# Patient Record
Sex: Male | Born: 1950 | Race: White | Hispanic: No | Marital: Married | Smoking: Former smoker
Health system: Southern US, Community
[De-identification: ages and names within clinical notes are randomized; demographics above are authoritative.]

## PROBLEM LIST (undated history)

## (undated) DIAGNOSIS — Z9289 Personal history of other medical treatment: Secondary | ICD-10-CM

## (undated) DIAGNOSIS — G709 Myoneural disorder, unspecified: Secondary | ICD-10-CM

## (undated) DIAGNOSIS — E7800 Pure hypercholesterolemia, unspecified: Secondary | ICD-10-CM

## (undated) DIAGNOSIS — Z789 Other specified health status: Secondary | ICD-10-CM

## (undated) DIAGNOSIS — G8929 Other chronic pain: Secondary | ICD-10-CM

## (undated) DIAGNOSIS — R519 Headache, unspecified: Secondary | ICD-10-CM

## (undated) DIAGNOSIS — H9190 Unspecified hearing loss, unspecified ear: Secondary | ICD-10-CM

## (undated) DIAGNOSIS — F419 Anxiety disorder, unspecified: Secondary | ICD-10-CM

## (undated) DIAGNOSIS — E119 Type 2 diabetes mellitus without complications: Secondary | ICD-10-CM

## (undated) DIAGNOSIS — I10 Essential (primary) hypertension: Secondary | ICD-10-CM

## (undated) DIAGNOSIS — M51369 Other intervertebral disc degeneration, lumbar region without mention of lumbar back pain or lower extremity pain: Secondary | ICD-10-CM

## (undated) DIAGNOSIS — K5792 Diverticulitis of intestine, part unspecified, without perforation or abscess without bleeding: Secondary | ICD-10-CM

## (undated) DIAGNOSIS — K219 Gastro-esophageal reflux disease without esophagitis: Secondary | ICD-10-CM

## (undated) DIAGNOSIS — M810 Age-related osteoporosis without current pathological fracture: Secondary | ICD-10-CM

## (undated) DIAGNOSIS — K5900 Constipation, unspecified: Secondary | ICD-10-CM

## (undated) DIAGNOSIS — R51 Headache: Secondary | ICD-10-CM

## (undated) DIAGNOSIS — I519 Heart disease, unspecified: Secondary | ICD-10-CM

## (undated) DIAGNOSIS — F32A Depression, unspecified: Secondary | ICD-10-CM

## (undated) DIAGNOSIS — R42 Dizziness and giddiness: Secondary | ICD-10-CM

## (undated) DIAGNOSIS — K279 Peptic ulcer, site unspecified, unspecified as acute or chronic, without hemorrhage or perforation: Secondary | ICD-10-CM

## (undated) DIAGNOSIS — F329 Major depressive disorder, single episode, unspecified: Secondary | ICD-10-CM

## (undated) DIAGNOSIS — B029 Zoster without complications: Secondary | ICD-10-CM

## (undated) DIAGNOSIS — I2510 Atherosclerotic heart disease of native coronary artery without angina pectoris: Secondary | ICD-10-CM

## (undated) DIAGNOSIS — N200 Calculus of kidney: Secondary | ICD-10-CM

## (undated) DIAGNOSIS — K289 Gastrojejunal ulcer, unspecified as acute or chronic, without hemorrhage or perforation: Secondary | ICD-10-CM

## (undated) DIAGNOSIS — M549 Dorsalgia, unspecified: Secondary | ICD-10-CM

## (undated) DIAGNOSIS — C801 Malignant (primary) neoplasm, unspecified: Secondary | ICD-10-CM

## (undated) DIAGNOSIS — Z87442 Personal history of urinary calculi: Secondary | ICD-10-CM

## (undated) DIAGNOSIS — M5136 Other intervertebral disc degeneration, lumbar region: Secondary | ICD-10-CM

## (undated) HISTORY — DX: Essential (primary) hypertension: I10

## (undated) HISTORY — DX: Other chronic pain: G89.29

## (undated) HISTORY — DX: Other intervertebral disc degeneration, lumbar region: M51.36

## (undated) HISTORY — DX: Major depressive disorder, single episode, unspecified: F32.9

## (undated) HISTORY — PX: CORONARY ANGIOPLASTY: SHX604

## (undated) HISTORY — DX: Zoster without complications: B02.9

## (undated) HISTORY — DX: Depression, unspecified: F32.A

## (undated) HISTORY — PX: CHOLECYSTECTOMY: SHX55

## (undated) HISTORY — PX: HAND SURGERY: SHX662

## (undated) HISTORY — PX: CORONARY STENT PLACEMENT: SHX1402

## (undated) HISTORY — PX: BACK SURGERY: SHX140

## (undated) HISTORY — DX: Heart disease, unspecified: I51.9

## (undated) HISTORY — DX: Type 2 diabetes mellitus without complications: E11.9

## (undated) HISTORY — DX: Dorsalgia, unspecified: M54.9

## (undated) HISTORY — DX: Dizziness and giddiness: R42

## (undated) HISTORY — DX: Other intervertebral disc degeneration, lumbar region without mention of lumbar back pain or lower extremity pain: M51.369

---

## 2004-04-24 ENCOUNTER — Ambulatory Visit: Payer: Self-pay | Admitting: Pain Medicine

## 2004-05-02 ENCOUNTER — Ambulatory Visit: Payer: Self-pay | Admitting: Pain Medicine

## 2004-06-04 ENCOUNTER — Ambulatory Visit: Payer: Self-pay | Admitting: Pain Medicine

## 2004-06-18 ENCOUNTER — Ambulatory Visit: Payer: Self-pay | Admitting: Pain Medicine

## 2004-08-06 ENCOUNTER — Ambulatory Visit: Payer: Self-pay | Admitting: Pain Medicine

## 2004-08-26 ENCOUNTER — Ambulatory Visit: Payer: Self-pay | Admitting: Pain Medicine

## 2004-09-05 ENCOUNTER — Ambulatory Visit: Payer: Self-pay | Admitting: Pain Medicine

## 2004-10-03 ENCOUNTER — Ambulatory Visit: Payer: Self-pay | Admitting: Pain Medicine

## 2004-10-31 ENCOUNTER — Ambulatory Visit: Payer: Self-pay | Admitting: Pain Medicine

## 2004-11-06 ENCOUNTER — Ambulatory Visit: Payer: Self-pay | Admitting: Pain Medicine

## 2004-11-28 ENCOUNTER — Ambulatory Visit: Payer: Self-pay | Admitting: Pain Medicine

## 2004-12-31 ENCOUNTER — Ambulatory Visit: Payer: Self-pay | Admitting: Pain Medicine

## 2005-01-06 ENCOUNTER — Ambulatory Visit: Payer: Self-pay | Admitting: Pain Medicine

## 2005-01-30 ENCOUNTER — Ambulatory Visit: Payer: Self-pay | Admitting: Pain Medicine

## 2005-02-08 ENCOUNTER — Other Ambulatory Visit: Payer: Self-pay

## 2005-02-09 ENCOUNTER — Inpatient Hospital Stay: Payer: Self-pay | Admitting: Internal Medicine

## 2005-02-10 ENCOUNTER — Other Ambulatory Visit: Payer: Self-pay

## 2005-03-04 ENCOUNTER — Ambulatory Visit: Payer: Self-pay | Admitting: Pain Medicine

## 2005-03-31 ENCOUNTER — Ambulatory Visit: Payer: Self-pay | Admitting: Pain Medicine

## 2005-04-29 ENCOUNTER — Ambulatory Visit: Payer: Self-pay | Admitting: Pain Medicine

## 2005-05-05 ENCOUNTER — Ambulatory Visit: Payer: Self-pay | Admitting: Pain Medicine

## 2005-06-03 ENCOUNTER — Ambulatory Visit: Payer: Self-pay | Admitting: Pain Medicine

## 2005-06-09 ENCOUNTER — Ambulatory Visit: Payer: Self-pay | Admitting: Pain Medicine

## 2005-07-01 ENCOUNTER — Ambulatory Visit: Payer: Self-pay | Admitting: Pain Medicine

## 2005-07-30 ENCOUNTER — Ambulatory Visit: Payer: Self-pay | Admitting: Pain Medicine

## 2005-09-02 ENCOUNTER — Ambulatory Visit: Payer: Self-pay | Admitting: Pain Medicine

## 2005-09-08 ENCOUNTER — Ambulatory Visit: Payer: Self-pay | Admitting: Pain Medicine

## 2005-09-30 ENCOUNTER — Ambulatory Visit: Payer: Self-pay | Admitting: Pain Medicine

## 2005-10-16 ENCOUNTER — Ambulatory Visit: Payer: Self-pay | Admitting: Psychiatry

## 2005-10-27 ENCOUNTER — Ambulatory Visit: Payer: Self-pay | Admitting: Pain Medicine

## 2005-11-25 ENCOUNTER — Ambulatory Visit: Payer: Self-pay | Admitting: Pain Medicine

## 2005-12-01 ENCOUNTER — Ambulatory Visit: Payer: Self-pay | Admitting: Pain Medicine

## 2005-12-25 ENCOUNTER — Ambulatory Visit: Payer: Self-pay | Admitting: Pain Medicine

## 2006-01-27 ENCOUNTER — Ambulatory Visit: Payer: Self-pay | Admitting: Pain Medicine

## 2006-02-09 ENCOUNTER — Ambulatory Visit: Payer: Self-pay | Admitting: Pain Medicine

## 2006-03-03 ENCOUNTER — Ambulatory Visit: Payer: Self-pay | Admitting: Pain Medicine

## 2006-03-09 ENCOUNTER — Ambulatory Visit: Payer: Self-pay | Admitting: Pain Medicine

## 2006-04-02 ENCOUNTER — Ambulatory Visit: Payer: Self-pay | Admitting: Pain Medicine

## 2006-04-20 ENCOUNTER — Ambulatory Visit: Payer: Self-pay | Admitting: Pain Medicine

## 2006-05-14 ENCOUNTER — Ambulatory Visit: Payer: Self-pay | Admitting: Pain Medicine

## 2006-06-16 ENCOUNTER — Ambulatory Visit: Payer: Self-pay | Admitting: Pain Medicine

## 2006-06-24 ENCOUNTER — Ambulatory Visit: Payer: Self-pay | Admitting: Pain Medicine

## 2006-07-21 ENCOUNTER — Ambulatory Visit: Payer: Self-pay | Admitting: Pain Medicine

## 2006-07-27 ENCOUNTER — Ambulatory Visit: Payer: Self-pay | Admitting: Pain Medicine

## 2006-08-18 ENCOUNTER — Ambulatory Visit: Payer: Self-pay | Admitting: Pain Medicine

## 2006-08-25 ENCOUNTER — Ambulatory Visit: Payer: Self-pay | Admitting: Physician Assistant

## 2006-08-26 ENCOUNTER — Ambulatory Visit: Payer: Self-pay | Admitting: Pain Medicine

## 2006-09-10 ENCOUNTER — Ambulatory Visit: Payer: Self-pay | Admitting: Pain Medicine

## 2006-09-23 ENCOUNTER — Ambulatory Visit: Payer: Self-pay | Admitting: Pain Medicine

## 2006-10-13 ENCOUNTER — Ambulatory Visit: Payer: Self-pay | Admitting: Pain Medicine

## 2006-10-21 ENCOUNTER — Ambulatory Visit: Payer: Self-pay | Admitting: Pain Medicine

## 2006-11-12 ENCOUNTER — Ambulatory Visit: Payer: Self-pay | Admitting: Pain Medicine

## 2006-11-18 ENCOUNTER — Ambulatory Visit: Payer: Self-pay | Admitting: Pain Medicine

## 2006-12-15 ENCOUNTER — Ambulatory Visit: Payer: Self-pay | Admitting: Pain Medicine

## 2006-12-21 ENCOUNTER — Ambulatory Visit: Payer: Self-pay | Admitting: Pain Medicine

## 2007-01-18 ENCOUNTER — Ambulatory Visit: Payer: Self-pay | Admitting: Pain Medicine

## 2007-01-27 ENCOUNTER — Ambulatory Visit: Payer: Self-pay | Admitting: Pain Medicine

## 2007-02-16 ENCOUNTER — Ambulatory Visit: Payer: Self-pay | Admitting: Pain Medicine

## 2007-02-24 ENCOUNTER — Ambulatory Visit: Payer: Self-pay | Admitting: Pain Medicine

## 2007-03-16 ENCOUNTER — Ambulatory Visit: Payer: Self-pay | Admitting: Pain Medicine

## 2007-04-14 ENCOUNTER — Ambulatory Visit: Payer: Self-pay | Admitting: Pain Medicine

## 2007-05-11 ENCOUNTER — Ambulatory Visit: Payer: Self-pay | Admitting: Pain Medicine

## 2007-05-19 ENCOUNTER — Ambulatory Visit: Payer: Self-pay | Admitting: Pain Medicine

## 2007-06-17 ENCOUNTER — Ambulatory Visit: Payer: Self-pay | Admitting: Pain Medicine

## 2007-07-19 ENCOUNTER — Ambulatory Visit: Payer: Self-pay | Admitting: Pain Medicine

## 2007-08-12 ENCOUNTER — Ambulatory Visit: Payer: Self-pay | Admitting: Pain Medicine

## 2007-08-18 ENCOUNTER — Ambulatory Visit: Payer: Self-pay | Admitting: Pain Medicine

## 2007-09-08 ENCOUNTER — Ambulatory Visit: Payer: Self-pay | Admitting: Pain Medicine

## 2007-10-12 ENCOUNTER — Ambulatory Visit: Payer: Self-pay | Admitting: Pain Medicine

## 2007-10-20 ENCOUNTER — Ambulatory Visit: Payer: Self-pay | Admitting: Pain Medicine

## 2007-10-25 ENCOUNTER — Ambulatory Visit: Payer: Self-pay | Admitting: Unknown Physician Specialty

## 2007-11-16 ENCOUNTER — Ambulatory Visit: Payer: Self-pay | Admitting: Pain Medicine

## 2007-11-22 ENCOUNTER — Ambulatory Visit: Payer: Self-pay | Admitting: Pain Medicine

## 2007-12-16 ENCOUNTER — Ambulatory Visit: Payer: Self-pay | Admitting: Pain Medicine

## 2008-01-12 ENCOUNTER — Ambulatory Visit: Payer: Self-pay | Admitting: Pain Medicine

## 2008-02-14 ENCOUNTER — Ambulatory Visit: Payer: Self-pay | Admitting: Pain Medicine

## 2008-03-14 ENCOUNTER — Ambulatory Visit: Payer: Self-pay | Admitting: Pain Medicine

## 2008-03-29 ENCOUNTER — Ambulatory Visit: Payer: Self-pay | Admitting: Pain Medicine

## 2008-04-13 ENCOUNTER — Ambulatory Visit: Payer: Self-pay | Admitting: Pain Medicine

## 2008-04-24 ENCOUNTER — Ambulatory Visit: Payer: Self-pay | Admitting: Pain Medicine

## 2008-05-11 ENCOUNTER — Ambulatory Visit: Payer: Self-pay | Admitting: Pain Medicine

## 2008-06-12 ENCOUNTER — Ambulatory Visit: Payer: Self-pay | Admitting: Pain Medicine

## 2008-07-11 ENCOUNTER — Ambulatory Visit: Payer: Self-pay | Admitting: Pain Medicine

## 2008-08-10 ENCOUNTER — Ambulatory Visit: Payer: Self-pay | Admitting: Pain Medicine

## 2008-08-30 ENCOUNTER — Ambulatory Visit: Payer: Self-pay | Admitting: Pain Medicine

## 2008-10-05 ENCOUNTER — Ambulatory Visit: Payer: Self-pay | Admitting: Pain Medicine

## 2008-11-09 ENCOUNTER — Ambulatory Visit: Payer: Self-pay | Admitting: Pain Medicine

## 2008-11-15 ENCOUNTER — Ambulatory Visit: Payer: Self-pay | Admitting: Pain Medicine

## 2008-12-07 ENCOUNTER — Ambulatory Visit: Payer: Self-pay | Admitting: Pain Medicine

## 2009-01-09 ENCOUNTER — Ambulatory Visit: Payer: Self-pay | Admitting: Pain Medicine

## 2009-01-17 ENCOUNTER — Ambulatory Visit: Payer: Self-pay | Admitting: Pain Medicine

## 2009-02-08 ENCOUNTER — Ambulatory Visit: Payer: Self-pay | Admitting: Pain Medicine

## 2009-02-12 ENCOUNTER — Ambulatory Visit: Payer: Self-pay | Admitting: Pain Medicine

## 2009-03-06 ENCOUNTER — Ambulatory Visit: Payer: Self-pay | Admitting: Pain Medicine

## 2009-03-21 ENCOUNTER — Ambulatory Visit: Payer: Self-pay | Admitting: Pain Medicine

## 2009-04-05 ENCOUNTER — Ambulatory Visit: Payer: Self-pay | Admitting: Pain Medicine

## 2009-05-08 ENCOUNTER — Ambulatory Visit: Payer: Self-pay | Admitting: Pain Medicine

## 2009-06-13 ENCOUNTER — Ambulatory Visit: Payer: Self-pay | Admitting: Pain Medicine

## 2009-06-26 ENCOUNTER — Ambulatory Visit: Payer: Self-pay | Admitting: Unknown Physician Specialty

## 2009-06-27 ENCOUNTER — Ambulatory Visit: Payer: Self-pay | Admitting: Pain Medicine

## 2009-07-10 ENCOUNTER — Ambulatory Visit: Payer: Self-pay | Admitting: Pain Medicine

## 2009-08-07 ENCOUNTER — Ambulatory Visit: Payer: Self-pay | Admitting: Pain Medicine

## 2009-09-12 ENCOUNTER — Ambulatory Visit: Payer: Self-pay | Admitting: Pain Medicine

## 2009-10-11 ENCOUNTER — Ambulatory Visit: Payer: Self-pay | Admitting: Pain Medicine

## 2009-11-06 ENCOUNTER — Ambulatory Visit: Payer: Self-pay | Admitting: Pain Medicine

## 2009-12-04 ENCOUNTER — Ambulatory Visit: Payer: Self-pay | Admitting: Pain Medicine

## 2010-01-03 ENCOUNTER — Ambulatory Visit: Payer: Self-pay | Admitting: Pain Medicine

## 2010-01-31 ENCOUNTER — Ambulatory Visit: Payer: Self-pay | Admitting: Pain Medicine

## 2010-03-05 ENCOUNTER — Ambulatory Visit: Payer: Self-pay | Admitting: Pain Medicine

## 2010-04-04 ENCOUNTER — Ambulatory Visit: Payer: Self-pay | Admitting: Pain Medicine

## 2010-05-02 ENCOUNTER — Ambulatory Visit: Payer: Self-pay | Admitting: Pain Medicine

## 2010-05-30 ENCOUNTER — Ambulatory Visit: Payer: Self-pay | Admitting: Pain Medicine

## 2010-07-02 ENCOUNTER — Ambulatory Visit: Payer: Self-pay | Admitting: Pain Medicine

## 2010-07-31 ENCOUNTER — Ambulatory Visit: Payer: Self-pay | Admitting: Pain Medicine

## 2010-08-29 ENCOUNTER — Ambulatory Visit: Payer: Self-pay | Admitting: Pain Medicine

## 2010-10-01 ENCOUNTER — Ambulatory Visit: Payer: Self-pay | Admitting: Pain Medicine

## 2010-10-31 ENCOUNTER — Ambulatory Visit: Payer: Self-pay | Admitting: Pain Medicine

## 2010-11-28 ENCOUNTER — Ambulatory Visit: Payer: Self-pay | Admitting: Pain Medicine

## 2010-12-31 ENCOUNTER — Ambulatory Visit: Payer: Self-pay | Admitting: Pain Medicine

## 2011-01-30 ENCOUNTER — Ambulatory Visit: Payer: Self-pay | Admitting: Pain Medicine

## 2011-02-27 ENCOUNTER — Ambulatory Visit: Payer: Self-pay | Admitting: Pain Medicine

## 2011-03-27 ENCOUNTER — Ambulatory Visit: Payer: Self-pay | Admitting: Pain Medicine

## 2011-04-24 ENCOUNTER — Ambulatory Visit: Payer: Self-pay | Admitting: Pain Medicine

## 2011-05-27 ENCOUNTER — Ambulatory Visit: Payer: Self-pay | Admitting: Pain Medicine

## 2011-06-24 ENCOUNTER — Ambulatory Visit: Payer: Self-pay | Admitting: Pain Medicine

## 2011-07-02 ENCOUNTER — Emergency Department: Payer: Self-pay

## 2011-07-24 ENCOUNTER — Ambulatory Visit: Payer: Self-pay | Admitting: Pain Medicine

## 2011-08-26 ENCOUNTER — Ambulatory Visit: Payer: Self-pay | Admitting: Pain Medicine

## 2011-09-23 ENCOUNTER — Ambulatory Visit: Payer: Self-pay | Admitting: Pain Medicine

## 2011-10-28 ENCOUNTER — Ambulatory Visit: Payer: Self-pay | Admitting: Pain Medicine

## 2011-11-25 ENCOUNTER — Ambulatory Visit: Payer: Self-pay | Admitting: Pain Medicine

## 2011-12-25 ENCOUNTER — Ambulatory Visit: Payer: Self-pay | Admitting: Pain Medicine

## 2012-01-27 ENCOUNTER — Ambulatory Visit: Payer: Self-pay | Admitting: Pain Medicine

## 2012-02-24 ENCOUNTER — Ambulatory Visit: Payer: Self-pay | Admitting: Pain Medicine

## 2012-03-25 ENCOUNTER — Ambulatory Visit: Payer: Self-pay | Admitting: Pain Medicine

## 2012-04-20 ENCOUNTER — Ambulatory Visit: Payer: Self-pay | Admitting: Pain Medicine

## 2012-05-25 ENCOUNTER — Ambulatory Visit: Payer: Self-pay | Admitting: Pain Medicine

## 2012-06-22 ENCOUNTER — Ambulatory Visit: Payer: Self-pay | Admitting: Pain Medicine

## 2012-07-22 ENCOUNTER — Ambulatory Visit: Payer: Self-pay | Admitting: Pain Medicine

## 2012-08-09 ENCOUNTER — Ambulatory Visit: Payer: Self-pay | Admitting: Unknown Physician Specialty

## 2012-08-17 ENCOUNTER — Ambulatory Visit: Payer: Self-pay | Admitting: Pain Medicine

## 2012-09-21 ENCOUNTER — Ambulatory Visit: Payer: Self-pay | Admitting: Pain Medicine

## 2012-10-21 ENCOUNTER — Ambulatory Visit: Payer: Self-pay | Admitting: Pain Medicine

## 2012-11-11 ENCOUNTER — Ambulatory Visit: Payer: Self-pay | Admitting: Pain Medicine

## 2012-11-23 ENCOUNTER — Ambulatory Visit: Payer: Self-pay | Admitting: Cardiology

## 2012-12-16 ENCOUNTER — Ambulatory Visit: Payer: Self-pay | Admitting: Pain Medicine

## 2012-12-22 ENCOUNTER — Ambulatory Visit: Payer: Self-pay | Admitting: Cardiology

## 2012-12-22 LAB — CK TOTAL AND CKMB (NOT AT ARMC)
CK, Total: 32 U/L — ABNORMAL LOW (ref 35–232)
CK-MB: 0.8 ng/mL (ref 0.5–3.6)

## 2012-12-23 LAB — BASIC METABOLIC PANEL
BUN: 16 mg/dL (ref 7–18)
Calcium, Total: 9 mg/dL (ref 8.5–10.1)
Co2: 29 mmol/L (ref 21–32)
Glucose: 187 mg/dL — ABNORMAL HIGH (ref 65–99)
Osmolality: 278 (ref 275–301)
Potassium: 4.3 mmol/L (ref 3.5–5.1)
Sodium: 136 mmol/L (ref 136–145)

## 2013-01-18 ENCOUNTER — Ambulatory Visit: Payer: Self-pay | Admitting: Pain Medicine

## 2013-02-17 ENCOUNTER — Ambulatory Visit: Payer: Self-pay | Admitting: Pain Medicine

## 2013-03-16 ENCOUNTER — Ambulatory Visit: Payer: Self-pay | Admitting: Pain Medicine

## 2013-04-19 ENCOUNTER — Ambulatory Visit: Payer: Self-pay | Admitting: Pain Medicine

## 2013-05-17 ENCOUNTER — Ambulatory Visit: Payer: Self-pay | Admitting: Pain Medicine

## 2013-06-16 ENCOUNTER — Ambulatory Visit: Payer: Self-pay | Admitting: Pain Medicine

## 2013-07-19 ENCOUNTER — Ambulatory Visit: Payer: Self-pay | Admitting: Pain Medicine

## 2013-08-18 ENCOUNTER — Ambulatory Visit: Payer: Self-pay | Admitting: Pain Medicine

## 2013-09-20 ENCOUNTER — Ambulatory Visit: Payer: Self-pay | Admitting: Pain Medicine

## 2013-10-18 ENCOUNTER — Ambulatory Visit: Payer: Self-pay | Admitting: Pain Medicine

## 2013-11-11 DIAGNOSIS — E785 Hyperlipidemia, unspecified: Secondary | ICD-10-CM | POA: Insufficient documentation

## 2013-11-11 DIAGNOSIS — I10 Essential (primary) hypertension: Secondary | ICD-10-CM | POA: Insufficient documentation

## 2013-11-11 DIAGNOSIS — M5136 Other intervertebral disc degeneration, lumbar region: Secondary | ICD-10-CM | POA: Insufficient documentation

## 2013-11-11 DIAGNOSIS — M5126 Other intervertebral disc displacement, lumbar region: Secondary | ICD-10-CM | POA: Insufficient documentation

## 2013-11-22 ENCOUNTER — Ambulatory Visit: Payer: Self-pay | Admitting: Pain Medicine

## 2013-12-21 ENCOUNTER — Ambulatory Visit: Payer: Self-pay | Admitting: Pain Medicine

## 2014-01-09 DIAGNOSIS — R0609 Other forms of dyspnea: Secondary | ICD-10-CM

## 2014-01-09 DIAGNOSIS — R195 Other fecal abnormalities: Secondary | ICD-10-CM | POA: Insufficient documentation

## 2014-01-09 DIAGNOSIS — R1013 Epigastric pain: Secondary | ICD-10-CM

## 2014-01-09 DIAGNOSIS — K5909 Other constipation: Secondary | ICD-10-CM | POA: Insufficient documentation

## 2014-01-09 DIAGNOSIS — R748 Abnormal levels of other serum enzymes: Secondary | ICD-10-CM | POA: Insufficient documentation

## 2014-01-09 DIAGNOSIS — D649 Anemia, unspecified: Secondary | ICD-10-CM | POA: Insufficient documentation

## 2014-01-09 DIAGNOSIS — G8929 Other chronic pain: Secondary | ICD-10-CM | POA: Insufficient documentation

## 2014-01-17 ENCOUNTER — Ambulatory Visit: Payer: Self-pay | Admitting: Pain Medicine

## 2014-02-15 DIAGNOSIS — R079 Chest pain, unspecified: Secondary | ICD-10-CM | POA: Insufficient documentation

## 2014-02-16 ENCOUNTER — Ambulatory Visit: Payer: Self-pay | Admitting: Pain Medicine

## 2014-02-22 ENCOUNTER — Ambulatory Visit: Payer: Self-pay | Admitting: Cardiology

## 2014-03-21 ENCOUNTER — Ambulatory Visit: Payer: Self-pay | Admitting: Pain Medicine

## 2014-04-19 ENCOUNTER — Ambulatory Visit: Payer: Self-pay | Admitting: Pain Medicine

## 2014-05-24 ENCOUNTER — Ambulatory Visit: Payer: Self-pay | Admitting: Pain Medicine

## 2014-06-21 ENCOUNTER — Ambulatory Visit: Payer: Self-pay | Admitting: Pain Medicine

## 2014-07-24 ENCOUNTER — Ambulatory Visit: Payer: Self-pay | Admitting: Pain Medicine

## 2014-08-23 ENCOUNTER — Ambulatory Visit: Payer: Self-pay | Admitting: Pain Medicine

## 2014-09-20 ENCOUNTER — Ambulatory Visit: Payer: Self-pay | Admitting: Pain Medicine

## 2014-10-17 ENCOUNTER — Ambulatory Visit
Admit: 2014-10-17 | Disposition: A | Discharge status: Home / Self Care | Payer: Self-pay | Attending: Pain Medicine | Admitting: Pain Medicine

## 2014-11-03 NOTE — Discharge Summary (Signed)
PATIENT NAME:  Todd Banks, Todd Banks MR#:  [id number] DATE OF BIRTH:  11-12-1950  DATE OF ADMISSION:  12/22/2012 DATE OF DISCHARGE:   12/23/2012  PRIMARY CARE PHYSICIAN:  Dr. Kary Kos.  FINAL DIAGNOSES: 1.  Coronary artery disease.  2.  Hypertension.  3.  Hyperlipidemia   DISCHARGE MEDICATIONS:  Aspirin 325 mg daily, Effient 10 mg daily, gemfibrozil 600 mg b.i.d., metoprolol 25 mg daily, pravastatin 40 mg daily, isosorbide dinitrate 30 mg b.i.d., trazodone 150 mg at bedtime, alprazolam 0.5 mg p.r.n., B12 injection, Humalog sliding scale, methadone tablet 10 mg, 6 tablets p.o. daily if tolerated; oxycodone 5 mg 2 to 4 tabs p.o. daily if tolerated, orphenadrine tablet extended release 100 mg b.i.d., bupropion 75 mg t.i.d., omeprazole 20 mg daily, Humalog 30 units t.i.d., metformin 1000 mg b.i.d., Lantus 60 units at bedtime.   PROCEDURES:  Percutaneous coronary intervention on 12/22/2012.   HISTORY OF PRESENT ILLNESS:  Please see admission H and [place] COURSE:  The patient underwent elective percutaneous coronary intervention on 12/22/2012, receiving a 2.25 x 16 mm drug-eluting Promus stent in the posterior descending coronary artery and a 2.5 x 16 mm Promus stent in the proximal left circumflex. The patient had an excellent angiographic result. Postprocedural CPK and MB were 32 and 0.8, respectively. On the morning of 12/23/2012, the patient was ambulating without difficulty. He was discharged home in stable condition. He is scheduled to see me in followup in 1 to 2 weeks.    ____________________________ Isaias Cowman, MD ap:dmm D: 12/23/2012 08:05:00 ET T: 12/23/2012 10:20:43 ET JOB#: [id number]  cc: Isaias Cowman, MD, <Dictator> Isaias Cowman MD ELECTRONICALLY SIGNED 12/27/2012 12:47

## 2014-11-15 ENCOUNTER — Encounter: Payer: Self-pay | Admitting: Pain Medicine

## 2014-11-19 DIAGNOSIS — M5137 Other intervertebral disc degeneration, lumbosacral region: Secondary | ICD-10-CM | POA: Insufficient documentation

## 2014-11-19 DIAGNOSIS — B0229 Other postherpetic nervous system involvement: Secondary | ICD-10-CM | POA: Insufficient documentation

## 2014-11-19 DIAGNOSIS — M1990 Unspecified osteoarthritis, unspecified site: Secondary | ICD-10-CM | POA: Insufficient documentation

## 2014-11-19 DIAGNOSIS — M961 Postlaminectomy syndrome, not elsewhere classified: Secondary | ICD-10-CM | POA: Insufficient documentation

## 2014-11-19 DIAGNOSIS — M51379 Other intervertebral disc degeneration, lumbosacral region without mention of lumbar back pain or lower extremity pain: Secondary | ICD-10-CM | POA: Insufficient documentation

## 2014-11-19 NOTE — Patient Instructions (Addendum)
Continue present medications.  F/U PCP for evaluation of BP and general medical condition.  F/U neurological evaluation.  .F/U surgical evaluation.   May consider radiofrequency rhizolysis, intraspinal implantation, and other procedures  Patient to call [facility] for any concerns prior to scheduled appointment.  Patient is to call [facility] should the patient have concerns prior to return appointment.  Return in 1 month for evaluation and medication refill.

## 2014-11-20 ENCOUNTER — Ambulatory Visit: Payer: 59 | Attending: Pain Medicine | Admitting: Pain Medicine

## 2014-11-20 ENCOUNTER — Encounter: Payer: Self-pay | Admitting: Pain Medicine

## 2014-11-20 VITALS — BP 121/85 | HR 82 | Temp 97.7°F | Resp 16 | Ht 69.0 in | Wt 203.0 lb

## 2014-11-20 DIAGNOSIS — M150 Primary generalized (osteo)arthritis: Secondary | ICD-10-CM

## 2014-11-20 DIAGNOSIS — M47816 Spondylosis without myelopathy or radiculopathy, lumbar region: Secondary | ICD-10-CM | POA: Insufficient documentation

## 2014-11-20 DIAGNOSIS — E1140 Type 2 diabetes mellitus with diabetic neuropathy, unspecified: Secondary | ICD-10-CM | POA: Insufficient documentation

## 2014-11-20 DIAGNOSIS — R209 Unspecified disturbances of skin sensation: Secondary | ICD-10-CM | POA: Insufficient documentation

## 2014-11-20 DIAGNOSIS — B0229 Other postherpetic nervous system involvement: Secondary | ICD-10-CM

## 2014-11-20 DIAGNOSIS — M961 Postlaminectomy syndrome, not elsewhere classified: Secondary | ICD-10-CM

## 2014-11-20 DIAGNOSIS — M159 Polyosteoarthritis, unspecified: Secondary | ICD-10-CM

## 2014-11-20 DIAGNOSIS — M545 Low back pain: Secondary | ICD-10-CM | POA: Diagnosis present

## 2014-11-20 DIAGNOSIS — M5137 Other intervertebral disc degeneration, lumbosacral region: Secondary | ICD-10-CM

## 2014-11-20 MED ORDER — ORPHENADRINE CITRATE ER 100 MG PO TB12: ORAL_TABLET | ORAL | Status: DC

## 2014-11-20 MED ORDER — METHADONE HCL 10 MG PO TABS: ORAL_TABLET | ORAL | Status: DC

## 2014-11-20 MED ORDER — OXYCODONE HCL 5 MG PO CAPS: ORAL_CAPSULE | ORAL | Status: DC

## 2014-11-20 NOTE — Progress Notes (Signed)
   Subjective:    Patient ID: Todd Banks, male    DOB: 30-Aug-1950, 64 y.o.   MRN: [id number]  HPI    Review of Systems     Objective:   Physical Exam        Assessment & Plan:

## 2014-11-20 NOTE — Progress Notes (Signed)
Patient is 64 year old gentleman returns to pain management for evaluation of lower back and lower extremity pain pain is increased with standing and walking pain involves the left lower extremity more than the right lower extremity. More interventional treatment at this time and consider modification of treatment pending patient's response to the present treatment plan.    Physical examination revealed patient to be with tenderness to palpation of the paraspinal musculature region of the cervical region and thoracic regions. He was tenderness of the acromioclavicular clavicular and glenohumeral joint region left greater than the right with slightly decreased grip strength. Palpation of the thoracic paraspinal musculature region was evidence of muscle spasms. There was tenderness over the lumbar paraspinal musculature region lumbar facet region with extension and palpation producing severe discomfort. There was tenderness over the gluteal and piriformis musculature regions severe tenderness over the PSIS and tenderness of the PIIS region Straight  leg raising was decreased to 20. The abdomen was soft and nontender. No costovertebral angle tenderness was noted. Assessment patient with lumbar lower extremity pain due to degenerative changes of the lumbar spine with surgical intervention of the lumbar region and history of postherpetic neuralgia of the left lower extremity as well as diabetes mellitus with diabetic neuropathy which appeared to be contributing to patient's lumbar and lower extremity pain and paresthesias  Continue present medications.  F/U PCP for evaliation of  BP and general medical  Condition.  F/U surgical evaluation.  F/U nrurological evaluation.  May consider radiofrequency rhizolysis or intraspinal procedures pending response to present treatment and F/U evaluation.  Patient to call [facility] should patient have concerns prior to scheduled return appointment. Marland Kitchen

## 2014-11-20 NOTE — Progress Notes (Signed)
Patient discharged ambulatory at 3:08 pm

## 2014-11-20 NOTE — Progress Notes (Signed)
   Subjective:    Patient ID: Todd Banks, male    DOB: 19-Jun-1951, 64 y.o.   MRN: [id number]  HPI    Review of Systems     Objective:   Physical Exam        Assessment & Plan:

## 2014-12-17 ENCOUNTER — Other Ambulatory Visit: Payer: Self-pay | Admitting: Pain Medicine

## 2014-12-17 DIAGNOSIS — E1340 Other specified diabetes mellitus with diabetic neuropathy, unspecified: Secondary | ICD-10-CM

## 2014-12-17 DIAGNOSIS — M961 Postlaminectomy syndrome, not elsewhere classified: Secondary | ICD-10-CM

## 2014-12-17 DIAGNOSIS — M5137 Other intervertebral disc degeneration, lumbosacral region: Secondary | ICD-10-CM

## 2014-12-17 DIAGNOSIS — M160 Bilateral primary osteoarthritis of hip: Secondary | ICD-10-CM

## 2014-12-17 DIAGNOSIS — B0229 Other postherpetic nervous system involvement: Secondary | ICD-10-CM

## 2014-12-21 ENCOUNTER — Ambulatory Visit: Payer: 59 | Attending: Pain Medicine | Admitting: Pain Medicine

## 2014-12-21 ENCOUNTER — Encounter: Payer: Self-pay | Admitting: Pain Medicine

## 2014-12-21 ENCOUNTER — Encounter (INDEPENDENT_AMBULATORY_CARE_PROVIDER_SITE_OTHER): Payer: Self-pay

## 2014-12-21 VITALS — BP 134/65 | HR 63 | Temp 98.0°F | Resp 16 | Ht 70.0 in | Wt 204.0 lb

## 2014-12-21 DIAGNOSIS — B0229 Other postherpetic nervous system involvement: Secondary | ICD-10-CM | POA: Diagnosis not present

## 2014-12-21 DIAGNOSIS — Z9889 Other specified postprocedural states: Secondary | ICD-10-CM | POA: Insufficient documentation

## 2014-12-21 DIAGNOSIS — E1340 Other specified diabetes mellitus with diabetic neuropathy, unspecified: Secondary | ICD-10-CM

## 2014-12-21 DIAGNOSIS — M545 Low back pain: Secondary | ICD-10-CM | POA: Diagnosis present

## 2014-12-21 DIAGNOSIS — M533 Sacrococcygeal disorders, not elsewhere classified: Secondary | ICD-10-CM | POA: Insufficient documentation

## 2014-12-21 DIAGNOSIS — M4806 Spinal stenosis, lumbar region: Secondary | ICD-10-CM | POA: Insufficient documentation

## 2014-12-21 DIAGNOSIS — M5137 Other intervertebral disc degeneration, lumbosacral region: Secondary | ICD-10-CM

## 2014-12-21 DIAGNOSIS — M79604 Pain in right leg: Secondary | ICD-10-CM | POA: Diagnosis present

## 2014-12-21 DIAGNOSIS — M461 Sacroiliitis, not elsewhere classified: Secondary | ICD-10-CM | POA: Insufficient documentation

## 2014-12-21 DIAGNOSIS — M19019 Primary osteoarthritis, unspecified shoulder: Secondary | ICD-10-CM | POA: Diagnosis not present

## 2014-12-21 DIAGNOSIS — E1140 Type 2 diabetes mellitus with diabetic neuropathy, unspecified: Secondary | ICD-10-CM | POA: Insufficient documentation

## 2014-12-21 DIAGNOSIS — M5136 Other intervertebral disc degeneration, lumbar region: Secondary | ICD-10-CM | POA: Diagnosis not present

## 2014-12-21 DIAGNOSIS — M961 Postlaminectomy syndrome, not elsewhere classified: Secondary | ICD-10-CM

## 2014-12-21 DIAGNOSIS — M160 Bilateral primary osteoarthritis of hip: Secondary | ICD-10-CM

## 2014-12-21 DIAGNOSIS — M79605 Pain in left leg: Secondary | ICD-10-CM | POA: Diagnosis present

## 2014-12-21 MED ORDER — OXYCODONE HCL 5 MG PO CAPS: ORAL_CAPSULE | ORAL | Status: DC

## 2014-12-21 MED ORDER — ORPHENADRINE CITRATE ER 100 MG PO TB12: ORAL_TABLET | ORAL | Status: DC

## 2014-12-21 MED ORDER — METHADONE HCL 10 MG PO TABS: ORAL_TABLET | ORAL | Status: DC

## 2014-12-21 NOTE — Patient Instructions (Addendum)
Continue present medications. Norflex (orphenadrine) oxycodone and methadone  F/U PCP for evaliation of  BP and general medical  condition.  F/U surgical evaluation.  F/U neurological evaluation.  May consider radiofrequency rhizolysis or intraspinal procedures pending response to present treatment and F/U evaluation.  Patient to call [facility] should patient have concerns prior to scheduled return appointment.

## 2014-12-21 NOTE — Progress Notes (Signed)
Safety precautions to be maintained throughout the outpatient stay will include: orient to surroundings, keep bed in low position, maintain call bell within reach at all times, provide assistance with transfer out of bed and ambulation.  

## 2014-12-21 NOTE — Progress Notes (Signed)
   Subjective:    Patient ID: Todd Banks, male    DOB: July 01, 1951, 64 y.o.   MRN: [id number]  HPI  Patient is 64 year old [place] returns to [facility] for further evaluation and treatment of pain involving the lower back lower extremity regions especially the left lower extremity region. Patient states she has pain involving the shoulder as well. Patient denies trauma change in events of daily living the call significant change in symptomatology. Patient states that the pain is aggravated by standing walking. Patient states that he avoids activities which appear to aggravate his condition. Patient admits that the pain radiating from the lumbar region for the left lower extremity more significantly with pain becoming more intense as the day progresses. We will remain available to consider patient for interventional treatment at the present time we will continue non-interventional treatment . The patient is understanding and agrees with suggested treatment plan     Review of Systems     Objective:   Physical Exam  Tennis to palpation of the splenius capitis occipitalis region palpation was reproduced pain of mild degree with mild tenderness over the acromioclavicular glenohumeral joint region and mild tends to palpation over the region of the cervical facet and cervical paraspinal musculature region. Patient appeared to be with unremarkable Spurling's maneuver. Patient appeared to be with bilaterally equal grip strength. Patient was able to perform drop test with mild difficulty noted on the right greater than the left. Tinel and Phalen's maneuver associated increased pain of minimal degree. With decreased grip strength noted as well. Palpation over the region of the thoracic facet thoracic paraspinal musculature region was with evidence of muscle spasms occurring in the lower thoracic region of moderate moderate to severe degree and left greater than the right. Palpation of the lumbar  paraspinal muscle region lumbar facet region associated with moderate to moderate severe discomfort left greater than the right. Extension and palpation of the lumbar facets reproduce moderate to moderately severe discomfort. Straight leg raising limited to approximately 20 without a definite increased pain with dorsiflexion noted. There was question decreased sensation along the L5 dermatomal distribution. There was negative clonus negative Homans. DTRs difficult to elicit.. 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  F/U surgical evaluation.  F/U neurological evaluation.  May consider radiofrequency rhizolysis or intraspinal procedures pending response to present treatment and F/U evaluation.  Patient to call [facility] should patient have concerns prior to scheduled return appointment.

## 2015-01-09 ENCOUNTER — Encounter: Payer: Self-pay | Admitting: Pain Medicine

## 2015-01-09 ENCOUNTER — Ambulatory Visit: Payer: 59 | Attending: Pain Medicine | Admitting: Pain Medicine

## 2015-01-09 VITALS — BP 120/71 | HR 66 | Temp 98.4°F | Resp 18 | Ht 69.0 in | Wt 200.0 lb

## 2015-01-09 DIAGNOSIS — M545 Low back pain: Secondary | ICD-10-CM | POA: Diagnosis present

## 2015-01-09 DIAGNOSIS — M5137 Other intervertebral disc degeneration, lumbosacral region: Secondary | ICD-10-CM

## 2015-01-09 DIAGNOSIS — M4806 Spinal stenosis, lumbar region: Secondary | ICD-10-CM | POA: Diagnosis not present

## 2015-01-09 DIAGNOSIS — M150 Primary generalized (osteo)arthritis: Secondary | ICD-10-CM

## 2015-01-09 DIAGNOSIS — B0229 Other postherpetic nervous system involvement: Secondary | ICD-10-CM | POA: Insufficient documentation

## 2015-01-09 DIAGNOSIS — E1340 Other specified diabetes mellitus with diabetic neuropathy, unspecified: Secondary | ICD-10-CM

## 2015-01-09 DIAGNOSIS — M19019 Primary osteoarthritis, unspecified shoulder: Secondary | ICD-10-CM | POA: Diagnosis not present

## 2015-01-09 DIAGNOSIS — M961 Postlaminectomy syndrome, not elsewhere classified: Secondary | ICD-10-CM

## 2015-01-09 DIAGNOSIS — M5136 Other intervertebral disc degeneration, lumbar region: Secondary | ICD-10-CM | POA: Diagnosis not present

## 2015-01-09 DIAGNOSIS — M159 Polyosteoarthritis, unspecified: Secondary | ICD-10-CM

## 2015-01-09 DIAGNOSIS — M79604 Pain in right leg: Secondary | ICD-10-CM | POA: Diagnosis present

## 2015-01-09 DIAGNOSIS — M79605 Pain in left leg: Secondary | ICD-10-CM | POA: Diagnosis present

## 2015-01-09 DIAGNOSIS — Z9889 Other specified postprocedural states: Secondary | ICD-10-CM | POA: Diagnosis not present

## 2015-01-09 MED ORDER — METHADONE HCL 10 MG PO TABS: ORAL_TABLET | ORAL | Status: DC

## 2015-01-09 MED ORDER — ORPHENADRINE CITRATE ER 100 MG PO TB12: ORAL_TABLET | ORAL | Status: DC

## 2015-01-09 MED ORDER — OXYCODONE HCL 5 MG PO CAPS: ORAL_CAPSULE | ORAL | Status: DC

## 2015-01-09 NOTE — Patient Instructions (Addendum)
Continue present medications Norflex oxycodone and methadone   F/U PCP for evaliation of  BP and general medical  condition.  F/U surgical evaluation.  F/U neurological evaluation.  May consider radiofrequency rhizolysis or intraspinal procedures pending response to present treatment and F/U evaluation.  Patient to call [facility] Management Center should patient have concerns prior to scheduled return appointment. Pain Management Discharge Instructions  General Discharge Instructions :  If you need to reach your doctor call: Monday-Friday 8:00 am - 4:00 pm at [phone] or toll free [phone].  After clinic hours [phone] to have operator reach doctor.  Bring all of your medication bottles to all your appointments in the pain clinic.  To cancel or reschedule your appointment with Pain Management please remember to call 24 hours in advance to avoid a fee.  Refer to the educational materials which you have been given on: General Risks, I had my Procedure. Discharge Instructions, Post Sedation.  Post Procedure Instructions:  The drugs you were given will stay in your system until tomorrow, so for the next 24 hours you should not drive, make any legal decisions or drink any alcoholic beverages.  You may eat anything you prefer, but it is better to start with liquids then soups and crackers, and gradually work up to solid foods.  Please notify your doctor immediately if you have any unusual bleeding, trouble breathing or pain that is not related to your normal pain.  Depending on the type of procedure that was done, some parts of your body may feel week and/or numb.  This usually clears up by tonight or the next day.  Walk with the use of an assistive device or accompanied by an adult for the 24 hours.  You may use ice on the affected area for the first 24 hours.  Put ice in a Ziploc bag and cover with a towel and place against area 15 minutes on 15 minutes off.  You may switch to  heat after 24 hours.

## 2015-01-09 NOTE — Progress Notes (Signed)
Safety precautions to be maintained throughout the outpatient stay will include: orient to surroundings, keep bed in low position, maintain call bell within reach at all times, provide assistance with transfer out of bed and ambulation.  

## 2015-01-09 NOTE — Progress Notes (Signed)
   Subjective:    Patient ID: KAEDYN POLIVKA, male    DOB: 10-13-1950, 64 y.o.   MRN: [id number]  HPI   Patient is 64 year old gentleman who returns to [facility] for further evaluation and treatment of pain involving the region of the lower back and lower extremity regions. ASIS of pain involving the shoulder of lesser degree. At the present time patient states that pain is fairly well-controlled. We will avoid interventional treatment and will consider modification of treatment pending response to treatment and follow-up evaluation. The patient was understanding and agreement with suggested treatment plan.   Review of Systems     Objective:   Physical Exam  There was mild tinnitus of the splenius capitis and occipitalis musculature region. Palpation over the cervical facet cervical paraspinal musculature region and thoracic facet thoracic paraspinal musculature region reproduced minimal discomfort. Palpation of the acromioclavicular glenohumeral joint was a tends to palpation of mild to moderate degree. Patient appeared to be with bilaterally equal grip strength. Tinel and Phalen's maneuver were without increase of pain of significant degree. There was no crepitus of the thoracic region noted. Palpation over the lumbar paraspinal muscles region lumbar facet region associated with moderate to moderately severe discomfort with lateral bending and rotation reproducing moderate to moderately severe pain. Straight leg raising was tolerates approximately 20 without increase of pain with dorsiflexion noted. There was negative clonus negative Homans. Palpation of the greater trochanteric region iliotibial band region was a tends to palpation of mild degree. No costovertebral angle tenderness was noted and no abdominal tends to palpation was noted      Assessment & Plan:  Degenerative disc disease lumbar spine Post surgical changes lumbar spine with L3-4 right L4-L5 moderate to severe  spinal canal stenosis  Postherpetic neuralgia of lumbar lower extremity region on the left  Degenerative joint disease of shoulder    Plan    Continue present medications Norflex and oxycodone and methadone   F/U PCP for evaliation of  BP and general medical  condition.  F/U surgical evaluation  F/U neurological evaluation  May consider radiofrequency rhizolysis or intraspinal procedures pending response to present treatment and F/U evaluation.  Patient to call [facility] should patient have concerns prior to scheduled return appointment.

## 2015-01-18 ENCOUNTER — Other Ambulatory Visit: Payer: Self-pay | Admitting: Pain Medicine

## 2015-02-08 ENCOUNTER — Encounter: Payer: Self-pay | Admitting: Pain Medicine

## 2015-02-08 ENCOUNTER — Ambulatory Visit: Payer: 59 | Attending: Pain Medicine | Admitting: Pain Medicine

## 2015-02-08 VITALS — BP 158/75 | HR 66 | Temp 98.6°F | Resp 18 | Ht 69.0 in | Wt 208.0 lb

## 2015-02-08 DIAGNOSIS — M150 Primary generalized (osteo)arthritis: Secondary | ICD-10-CM

## 2015-02-08 DIAGNOSIS — M545 Low back pain: Secondary | ICD-10-CM | POA: Diagnosis present

## 2015-02-08 DIAGNOSIS — M79605 Pain in left leg: Secondary | ICD-10-CM | POA: Diagnosis present

## 2015-02-08 DIAGNOSIS — M79604 Pain in right leg: Secondary | ICD-10-CM | POA: Diagnosis present

## 2015-02-08 DIAGNOSIS — M961 Postlaminectomy syndrome, not elsewhere classified: Secondary | ICD-10-CM

## 2015-02-08 DIAGNOSIS — B0229 Other postherpetic nervous system involvement: Secondary | ICD-10-CM | POA: Insufficient documentation

## 2015-02-08 DIAGNOSIS — M4806 Spinal stenosis, lumbar region: Secondary | ICD-10-CM | POA: Insufficient documentation

## 2015-02-08 DIAGNOSIS — M19019 Primary osteoarthritis, unspecified shoulder: Secondary | ICD-10-CM | POA: Diagnosis not present

## 2015-02-08 DIAGNOSIS — M5137 Other intervertebral disc degeneration, lumbosacral region: Secondary | ICD-10-CM

## 2015-02-08 DIAGNOSIS — M5136 Other intervertebral disc degeneration, lumbar region: Secondary | ICD-10-CM | POA: Diagnosis not present

## 2015-02-08 DIAGNOSIS — M159 Polyosteoarthritis, unspecified: Secondary | ICD-10-CM

## 2015-02-08 DIAGNOSIS — Z9889 Other specified postprocedural states: Secondary | ICD-10-CM | POA: Diagnosis not present

## 2015-02-08 DIAGNOSIS — E1340 Other specified diabetes mellitus with diabetic neuropathy, unspecified: Secondary | ICD-10-CM

## 2015-02-08 MED ORDER — METHADONE HCL 10 MG PO TABS: ORAL_TABLET | ORAL | Status: DC

## 2015-02-08 MED ORDER — OXYCODONE HCL 5 MG PO CAPS: ORAL_CAPSULE | ORAL | Status: DC

## 2015-02-08 MED ORDER — ORPHENADRINE CITRATE ER 100 MG PO TB12: ORAL_TABLET | ORAL | Status: DC

## 2015-02-08 NOTE — Progress Notes (Signed)
   Subjective:    Patient ID: Todd Banks, male    DOB: 1951-04-18, 64 y.o.   MRN: [id number]  HPI   Patient is 64 year old gentleman returns a [facility] for further evaluation and treatment of pain involving the lower back lower extremity region shoulders. Patient denies any trauma change in events of daily living the call significant change in symptomatology. Patient just returned from cross[place] vacation and stated that he enjoyed the trip and was without severe pain interfering with his activities of daily living during the trip. We will continue present medications and avoid interventional treatment at this time. Patient states he continues to do rather well     Review of Systems     Objective:   Physical Exam  There was mild tenderness of the splenius capitis and occipitalis muscles. Mild tenderness of the cervical facet cervical paraspinal musculature region as well as mild tenderness of thoracic facet thoracic paraspinal musculature region. Patient appeared to be with unremarkable Spurling's maneuver. Tinel and Phalen's maneuver without increased pain of significant degree. Patient appeared to be with bilaterally equal grip strength. There was tenderness to palpation over the region of the acromioclavicular and glenohumeral joint regions of mild degree. Patient was with unremarkable Test. Palpation over the lower thoracic thoracic paraspinal musculature region was a tends to palpation of mild to moderate degree. Lateral bending rotation extension and palpation of the lumbar facets reproduce moderately severe discomfort. There was tenderness over the PSIS and PII S region of moderate degree. Straight leg raising limited to approximately 20 without increased pain with dorsiflexion noted. There was negative clonus negative Homans. There appeared to be decreased sensation of the L5 dermatomal distribution. Abdomen was nontender with no costovertebral angle tenderness noted.   Assessment & Plan:    Degenerative disc disease lumbar spine Post surgical changes lumbar spine with L3-4 right L4-L5 moderate to severe spinal canal stenosis  Postherpetic neuralgia of lumbar lower extremity region on the left  Degenerative joint disease of shoulder   Plan    Continue present medications Norflex oxycodone and methadone   F/U PCP Dr. Kary Kos for evaliation of  BP and general medical  condition.  F/U surgical evaluation  F/U neurological evaluation  May consider radiofrequency rhizolysis or intraspinal procedures pending response to present treatment and F/U evaluation.  Patient to call [facility] should patient have concerns prior to scheduled return appointmen

## 2015-02-08 NOTE — Patient Instructions (Signed)
Continue present medications Norflex and oxycodone and methadone  F/U PCP Dr. Kary Kos  for evaliation of  BP diabetes mellitus and general medical  condition.  F/U surgical evaluation  F/U neurological evaluation  May consider radiofrequency rhizolysis or intraspinal procedures pending response to present treatment and F/U evaluation.  Patient to call [facility] should patient have concerns prior to scheduled return appointment.

## 2015-03-04 DIAGNOSIS — E1165 Type 2 diabetes mellitus with hyperglycemia: Secondary | ICD-10-CM | POA: Insufficient documentation

## 2015-03-04 DIAGNOSIS — Z794 Long term (current) use of insulin: Secondary | ICD-10-CM

## 2015-03-13 ENCOUNTER — Encounter: Payer: Self-pay | Admitting: Pain Medicine

## 2015-03-13 ENCOUNTER — Ambulatory Visit: Payer: 59 | Attending: Pain Medicine | Admitting: Pain Medicine

## 2015-03-13 VITALS — BP 145/70 | HR 69 | Temp 98.4°F | Resp 18 | Ht 69.0 in | Wt 202.0 lb

## 2015-03-13 DIAGNOSIS — M79604 Pain in right leg: Secondary | ICD-10-CM | POA: Diagnosis present

## 2015-03-13 DIAGNOSIS — M545 Low back pain: Secondary | ICD-10-CM | POA: Diagnosis present

## 2015-03-13 DIAGNOSIS — M4806 Spinal stenosis, lumbar region: Secondary | ICD-10-CM | POA: Diagnosis not present

## 2015-03-13 DIAGNOSIS — M19019 Primary osteoarthritis, unspecified shoulder: Secondary | ICD-10-CM | POA: Insufficient documentation

## 2015-03-13 DIAGNOSIS — Z9889 Other specified postprocedural states: Secondary | ICD-10-CM | POA: Diagnosis not present

## 2015-03-13 DIAGNOSIS — M5136 Other intervertebral disc degeneration, lumbar region: Secondary | ICD-10-CM | POA: Insufficient documentation

## 2015-03-13 DIAGNOSIS — M160 Bilateral primary osteoarthritis of hip: Secondary | ICD-10-CM

## 2015-03-13 DIAGNOSIS — B0229 Other postherpetic nervous system involvement: Secondary | ICD-10-CM

## 2015-03-13 DIAGNOSIS — M5137 Other intervertebral disc degeneration, lumbosacral region: Secondary | ICD-10-CM

## 2015-03-13 DIAGNOSIS — E1340 Other specified diabetes mellitus with diabetic neuropathy, unspecified: Secondary | ICD-10-CM

## 2015-03-13 DIAGNOSIS — M79605 Pain in left leg: Secondary | ICD-10-CM | POA: Diagnosis present

## 2015-03-13 DIAGNOSIS — M961 Postlaminectomy syndrome, not elsewhere classified: Secondary | ICD-10-CM

## 2015-03-13 MED ORDER — OXYCODONE HCL 5 MG PO CAPS: ORAL_CAPSULE | ORAL | Status: DC

## 2015-03-13 MED ORDER — ORPHENADRINE CITRATE ER 100 MG PO TB12: ORAL_TABLET | ORAL | Status: DC

## 2015-03-13 MED ORDER — METHADONE HCL 10 MG PO TABS: ORAL_TABLET | ORAL | Status: DC

## 2015-03-13 NOTE — Patient Instructions (Signed)
Continue present medications Norflex  oxycodone and methadone  F/U PCP Dr. Kary Kos for evaliation of  BP and general medical  condition  F/U surgical evaluation  F/U neurological evaluation  May consider radiofrequency rhizolysis or intraspinal procedures pending response to present treatment and F/U evaluation   Patient to call [facility] should patient have concerns prior to scheduled return appointment.

## 2015-03-13 NOTE — Progress Notes (Signed)
   Subjective:    Patient ID: Todd Banks, male    DOB: 1951-06-10, 64 y.o.   MRN: [id number]  HPI Patient is 64 year old gentleman returns to [facility] for further evaluation and treatment of pain involving the lower back lower extremity region as well as pain involving the region of the shoulder. Patient states that the present time pain is fairly well-controlled. Patient recently returned from vacation further patient traveled across the [place]. Patient states he was able to enjoy most of the trip without any significant pain interfering with activities of daily living. Patient denies any trauma change in events of daily living the call significant change in symptomatology. We will continue presently prescribed medications and will consider patient for interventional treatment as well as modification of medications and other aspects of treatment regimen pending follow-up evaluation. The patient was with understanding and agreed suggested treatment plan. The patient will continue Norflex oxycodone and methadone as prescribed at this time   Review of Systems     Objective:   Physical Exam  There was mild tenderness of the splenius capitis and occipitalis musculature regions. Patient appeared to be with minimal tenderness over the cervical facet cervical paraspinal musculature region. Patient appeared to be with tinged palpation of the thoracic facet thoracic paraspinal musculature region. There was mild tinnitus of the acromioclavicular and glenohumeral joint regions. Tinel and Phalen's maneuver were without increase of pain significant degree and patient appeared to be with slightly decreased grip strength. Palpation over the thoracic facet thoracic paraspinal musculature region was without significant increase of pain. Palpation over the lower thoracic paraspinal musculature region thoracic facet region was with moderate discomfort. Lateral bending and rotation and extension  and palpation of the lumbar facets reproduce moderate to moderately severe discomfort. Palpation along the greater trochanteric region iliotibial band region was with mild discomfort. Straight leg raising was limited to approximately 10-20 without a definite increased pain with dorsiflexion noted. There was question decreased sensation along the L5 dermatomal distribution. There was negative clonus negative Homans. DTRs difficult to this patient difficulty relaxing. Abdomen nontender no costovertebral angle tenderness was noted      Assessment & Plan:    Degenerative disc disease lumbar spine Post surgical changes lumbar spine with L3-4 right L4-L5 moderate to severe spinal canal stenosis  Postherpetic neuralgia of lumbar lower extremity region on the left  Degenerative joint disease of shoulder    Plan   Continue present medications Norflex oxycodone and methadone  F/U PCP Dr. Kary Kos for evaliation of  BP and general medical  condition  F/U surgical evaluation . Patient with prior surgical evaluation and intervention without plans for additional surgical intervention at this time  F/U neurological evaluation. May consider pending follow-up evaluation  May consider radiofrequency rhizolysis or intraspinal procedures pending response to present treatment and F/U evaluation   Patient to call [facility] should patient have concerns prior to scheduled return appointment.

## 2015-03-13 NOTE — Progress Notes (Signed)
Safety precautions to be maintained throughout the outpatient stay will include: orient to surroundings, keep bed in low position, maintain call bell within reach at all times, provide assistance with transfer out of bed and ambulation.  

## 2015-04-11 ENCOUNTER — Ambulatory Visit: Payer: 59 | Attending: Pain Medicine | Admitting: Pain Medicine

## 2015-04-11 ENCOUNTER — Encounter: Payer: Self-pay | Admitting: Pain Medicine

## 2015-04-11 VITALS — BP 123/62 | HR 77 | Temp 98.1°F | Resp 16 | Ht 68.0 in | Wt 202.0 lb

## 2015-04-11 DIAGNOSIS — M79605 Pain in left leg: Secondary | ICD-10-CM | POA: Diagnosis present

## 2015-04-11 DIAGNOSIS — E1340 Other specified diabetes mellitus with diabetic neuropathy, unspecified: Secondary | ICD-10-CM

## 2015-04-11 DIAGNOSIS — M150 Primary generalized (osteo)arthritis: Secondary | ICD-10-CM

## 2015-04-11 DIAGNOSIS — M19019 Primary osteoarthritis, unspecified shoulder: Secondary | ICD-10-CM | POA: Insufficient documentation

## 2015-04-11 DIAGNOSIS — M159 Polyosteoarthritis, unspecified: Secondary | ICD-10-CM

## 2015-04-11 DIAGNOSIS — B0229 Other postherpetic nervous system involvement: Secondary | ICD-10-CM | POA: Diagnosis not present

## 2015-04-11 DIAGNOSIS — Z9889 Other specified postprocedural states: Secondary | ICD-10-CM | POA: Insufficient documentation

## 2015-04-11 DIAGNOSIS — M5136 Other intervertebral disc degeneration, lumbar region: Secondary | ICD-10-CM | POA: Insufficient documentation

## 2015-04-11 DIAGNOSIS — M5137 Other intervertebral disc degeneration, lumbosacral region: Secondary | ICD-10-CM

## 2015-04-11 DIAGNOSIS — M961 Postlaminectomy syndrome, not elsewhere classified: Secondary | ICD-10-CM

## 2015-04-11 DIAGNOSIS — M545 Low back pain: Secondary | ICD-10-CM | POA: Diagnosis present

## 2015-04-11 DIAGNOSIS — M4806 Spinal stenosis, lumbar region: Secondary | ICD-10-CM | POA: Insufficient documentation

## 2015-04-11 DIAGNOSIS — M79604 Pain in right leg: Secondary | ICD-10-CM | POA: Diagnosis present

## 2015-04-11 DIAGNOSIS — M160 Bilateral primary osteoarthritis of hip: Secondary | ICD-10-CM

## 2015-04-11 MED ORDER — ORPHENADRINE CITRATE ER 100 MG PO TB12: ORAL_TABLET | ORAL | Status: DC

## 2015-04-11 MED ORDER — OXYCODONE HCL 5 MG PO CAPS: ORAL_CAPSULE | ORAL | Status: DC

## 2015-04-11 MED ORDER — METHADONE HCL 10 MG PO TABS: ORAL_TABLET | ORAL | Status: DC

## 2015-04-11 NOTE — Patient Instructions (Addendum)
PLAN   Continue present medication Norflex  oxycodone and methadone  F/U PCP Dr. Kary Kos for evaliation of  BP and general medical  condition  F/U surgical evaluation. May consider pending follow-up evaluations  F/U neurological evaluation. May consider pending follow-up evaluations  May consider radiofrequency rhizolysis or intraspinal procedures pending response to present treatment and F/U evaluation   Patient to call [facility] should patient have concerns prior to scheduled return appointment.  A prescription for METHADONE, OXYCODONE, NORFLEX was given to you today.

## 2015-04-11 NOTE — Progress Notes (Signed)
Safety precautions to be maintained throughout the outpatient stay will include: orient to surroundings, keep bed in low position, maintain call bell within reach at all times, provide assistance with transfer out of bed and ambulation.  Discharged ambulatory at 12:48 pm

## 2015-04-11 NOTE — Progress Notes (Signed)
   Subjective:    Patient ID: Todd Banks, male    DOB: 06/12/1951, 64 y.o.   MRN: [id number]  HPI Patient is 64 year old gentleman [facility] evaluation and treatment of pain involving the lower back and lower extremity region. Patient states that his pain present time is fairly well-controlled. He does note some increased pain involving the lower back and lower extremity region especially on the left. Patient states that he has decreased is working one day per week at this time due to what appears to be gradually increasing pain involving the lower back and lower extremity regions. We discussed patient's condition and will remain available to consider interventional treatment as well as additional modification of  patient's treatment regimen. The patient was in agreement with suggested treatment plan.     Review of Systems     Objective:   Physical Exam  There was tenderness of the splenius capitis and occipitalis musculature regions of mild degree. There was mild tenderness of the cervical facet cervical paraspinal musculature region as well as the thoracic facet thoracic paraspinal musculature region. Palpation of the acromioclavicular and glenohumeral joint regions reproduced mild discomfort. There was tends to palpation over the region of the thoracic facet thoracic paraspinal muscle region with no crepitus of the thoracic region noted. Patient was able to perform drop test without significant difficulty. Tinel and Phalen's maneuver were without increased pain of significant degree. Palpation over the lumbar paraspinal musculature region lumbar facet region was associated with moderately severe discomfort. There was tenderness to palpation over the region of the SIS and PII S region of moderately severe degree. There was moderate tends to palpation of the greater trochanteric region and iliotibial band region. Straight leg raising limited to approximately 20  without increased pain with dorsiflexion noted. There was questionable decreased sensation of the L5 dermatomal distribution. There was negative clonus negative Homans. Patient with difficulty attempt to stand on tiptoes and heels. Abdomen was nontender and no costovertebral angle tenderness was noted.    ASSESSMENT  Degenerative disc disease lumbar spine Post surgical changes lumbar spine with L3-4 right L4-L5 moderate to severe spinal canal stenosis  Postherpetic neuralgia of lumbar lower extremity region on the left  Degenerative joint disease of shoulder      PLAN   Continue present medication Norflex oxycodone and methadone  F/U PCP  Dr. Kary Kos for evaliation of  BP diabetes mellitus and general medical  condition  F/U surgical evaluation. May consider pending follow-up evaluations  F/U neurological evaluation. May consider pending follow-up evaluations  May consider radiofrequency rhizolysis or intraspinal procedures pending response to present treatment and F/U evaluation   Patient to call [facility] should patient have concerns prior to scheduled return appointment.     Assessment & Plan:

## 2015-04-12 ENCOUNTER — Ambulatory Visit: Payer: Medicare Other | Admitting: Pain Medicine

## 2015-04-18 ENCOUNTER — Other Ambulatory Visit: Payer: Self-pay | Admitting: Pain Medicine

## 2015-05-10 ENCOUNTER — Encounter: Payer: Self-pay | Admitting: Pain Medicine

## 2015-05-10 ENCOUNTER — Ambulatory Visit: Payer: 59 | Attending: Pain Medicine | Admitting: Pain Medicine

## 2015-05-10 VITALS — BP 149/79 | HR 75 | Temp 98.2°F | Resp 18 | Ht 68.0 in | Wt 206.0 lb

## 2015-05-10 DIAGNOSIS — M5136 Other intervertebral disc degeneration, lumbar region: Secondary | ICD-10-CM | POA: Diagnosis not present

## 2015-05-10 DIAGNOSIS — Z9889 Other specified postprocedural states: Secondary | ICD-10-CM | POA: Insufficient documentation

## 2015-05-10 DIAGNOSIS — M150 Primary generalized (osteo)arthritis: Secondary | ICD-10-CM

## 2015-05-10 DIAGNOSIS — B0229 Other postherpetic nervous system involvement: Secondary | ICD-10-CM | POA: Insufficient documentation

## 2015-05-10 DIAGNOSIS — M4806 Spinal stenosis, lumbar region: Secondary | ICD-10-CM | POA: Diagnosis not present

## 2015-05-10 DIAGNOSIS — M160 Bilateral primary osteoarthritis of hip: Secondary | ICD-10-CM

## 2015-05-10 DIAGNOSIS — M5137 Other intervertebral disc degeneration, lumbosacral region: Secondary | ICD-10-CM

## 2015-05-10 DIAGNOSIS — E1140 Type 2 diabetes mellitus with diabetic neuropathy, unspecified: Secondary | ICD-10-CM | POA: Diagnosis not present

## 2015-05-10 DIAGNOSIS — M545 Low back pain: Secondary | ICD-10-CM | POA: Insufficient documentation

## 2015-05-10 DIAGNOSIS — M159 Polyosteoarthritis, unspecified: Secondary | ICD-10-CM

## 2015-05-10 DIAGNOSIS — M19019 Primary osteoarthritis, unspecified shoulder: Secondary | ICD-10-CM | POA: Diagnosis not present

## 2015-05-10 DIAGNOSIS — M961 Postlaminectomy syndrome, not elsewhere classified: Secondary | ICD-10-CM

## 2015-05-10 DIAGNOSIS — E1340 Other specified diabetes mellitus with diabetic neuropathy, unspecified: Secondary | ICD-10-CM

## 2015-05-10 MED ORDER — OXYCODONE HCL 5 MG PO CAPS: ORAL_CAPSULE | ORAL | Status: DC

## 2015-05-10 MED ORDER — METHADONE HCL 10 MG PO TABS: ORAL_TABLET | ORAL | Status: DC

## 2015-05-10 MED ORDER — ORPHENADRINE CITRATE ER 100 MG PO TB12: ORAL_TABLET | ORAL | Status: DC

## 2015-05-10 NOTE — Progress Notes (Signed)
Safety precautions to be maintained throughout the outpatient stay will include: orient to surroundings, keep bed in low position, maintain call bell within reach at all times, provide assistance with transfer out of bed and ambulation.  

## 2015-05-10 NOTE — Patient Instructions (Signed)
PLAN  Continue present medication Norflex  oxycodone and methadone  F/U PCP Dr. Kary Kos for evaliation of  BP and general medical  condition  F/U surgical evaluation. May consider pending follow-up evaluations  F/U neurological evaluation. May consider pending follow-up evaluations  May consider radiofrequency rhizolysis or intraspinal procedures pending response to present treatment and F/U evaluation   Patient to call [facility] should patient have concerns prior to scheduled return appointment.

## 2015-05-10 NOTE — Progress Notes (Signed)
   Subjective:    Patient ID: Todd Banks, male    DOB: 10/18/1950, 64 y.o.   MRN: [id number]  HPI  Patient is 64 year old gentleman who returns to [facility] for further evaluation and treatment of pain involving the region of the lower back and lower extremity region with pain involving the shoulder of lesser degree on today's visit patient have bandage on his for him. The patient stated that he will shooting his new rifle when the scope hit his 64 as a result of daily back from firing the right. Patient denied any significant trauma as a result of the scope. His head. Patient continues to be with lower back lower extremity pain with prior surgical intervention of the lumbar region. At the present time patient states that medications. To be controlling pain fairly well. We will remain available to consider for interventional treatment should there be significant increase of pain or change in patient's condition. The patient was understanding and in agreement with suggested treatment plan.      Review of Systems     Objective:   Physical Exam Patient was with bandage of the mid  Forehead. The head was without  obvious swelling. No new lesions of the head were noted no masses of the head and neck were noted. Palpation over the cervical facet cervical paraspinal musculature region reproduced pain of moderate degree. There was moderate tenderness of the acromioclavicular and glenohumeral joint region. Patient appeared to be with unremarkable Spurling's maneuver. Patient appeared to be with slightly decreased grip strength. Tinel and Phalen's maneuver were without increase of pain of significant degree. Palpation over the region of the thoracic facet thoracic paraspinal musculature region was a tends to palpation of mild degree. There was no crepitus of the thoracic region noted. Palpation over the lumbar paraspinal muscles lumbar facet region was a tends to palpation of moderately severe  degree. There was severe muscle spasms of the thoracic paraspinal musculature region noted. Extension and palpation of the lumbar facets reproduce moderately severe discomfort lateral bending and rotation reproduce moderately severe discomfort in the lumbar region. Straight leg raising was limited to approximately 20 without increased pain with dorsiflexion noted. EHL strength appeared to be decreased. There appeared to be decreased sensation of the L5 dermatomal distribution. DTRs were difficult to elicit. There was negative clonus negative Homans. Abdomen was nontender with no costovertebral angle tenderness noted there was mild to moderate tenderness of the greater trochanteric region and iliotibial band region      Assessment & Plan:    Degenerative disc disease lumbar spine Post surgical changes lumbar spine with L3-4 right L4-L5 moderate to severe spinal canal stenosis  Postherpetic neuralgia of lumbar lower extremity region on the left  Degenerative joint disease of shoulder  Diabetic neuropathy    PLAN  Continue present medication Norflex oxycodone and methadone  F/U PCP  Dr. Kary Kos for evaliation of  BP diabetes mellitus lesion of forehead and general medical  condition  F/U surgical evaluation. May consider pending follow-up evaluation. Patient is with prior surgery of the lumbar region without plans for additional surgery   F/U neurological evaluation. May consider pending follow-up evaluations  May consider radiofrequency rhizolysis or intraspinal procedures pending response to present treatment and F/U evaluation   Patient to call [facility] should patient have concerns prior to scheduled return appointment.

## 2015-06-11 ENCOUNTER — Ambulatory Visit: Payer: 59 | Attending: Pain Medicine | Admitting: Pain Medicine

## 2015-06-11 VITALS — BP 135/72 | HR 79 | Temp 98.2°F | Resp 16 | Ht 69.0 in | Wt 210.0 lb

## 2015-06-11 DIAGNOSIS — M19019 Primary osteoarthritis, unspecified shoulder: Secondary | ICD-10-CM | POA: Diagnosis not present

## 2015-06-11 DIAGNOSIS — E1140 Type 2 diabetes mellitus with diabetic neuropathy, unspecified: Secondary | ICD-10-CM | POA: Insufficient documentation

## 2015-06-11 DIAGNOSIS — M5136 Other intervertebral disc degeneration, lumbar region: Secondary | ICD-10-CM | POA: Diagnosis not present

## 2015-06-11 DIAGNOSIS — M47816 Spondylosis without myelopathy or radiculopathy, lumbar region: Secondary | ICD-10-CM | POA: Diagnosis not present

## 2015-06-11 DIAGNOSIS — M961 Postlaminectomy syndrome, not elsewhere classified: Secondary | ICD-10-CM

## 2015-06-11 DIAGNOSIS — B0229 Other postherpetic nervous system involvement: Secondary | ICD-10-CM | POA: Insufficient documentation

## 2015-06-11 DIAGNOSIS — M545 Low back pain: Secondary | ICD-10-CM | POA: Diagnosis present

## 2015-06-11 DIAGNOSIS — E1340 Other specified diabetes mellitus with diabetic neuropathy, unspecified: Secondary | ICD-10-CM

## 2015-06-11 DIAGNOSIS — M4806 Spinal stenosis, lumbar region: Secondary | ICD-10-CM | POA: Diagnosis not present

## 2015-06-11 DIAGNOSIS — M79604 Pain in right leg: Secondary | ICD-10-CM | POA: Diagnosis present

## 2015-06-11 DIAGNOSIS — Z9889 Other specified postprocedural states: Secondary | ICD-10-CM | POA: Insufficient documentation

## 2015-06-11 DIAGNOSIS — M150 Primary generalized (osteo)arthritis: Secondary | ICD-10-CM

## 2015-06-11 DIAGNOSIS — M160 Bilateral primary osteoarthritis of hip: Secondary | ICD-10-CM

## 2015-06-11 DIAGNOSIS — M159 Polyosteoarthritis, unspecified: Secondary | ICD-10-CM

## 2015-06-11 DIAGNOSIS — M5137 Other intervertebral disc degeneration, lumbosacral region: Secondary | ICD-10-CM

## 2015-06-11 DIAGNOSIS — M79605 Pain in left leg: Secondary | ICD-10-CM | POA: Diagnosis present

## 2015-06-11 MED ORDER — METHADONE HCL 10 MG PO TABS: ORAL_TABLET | ORAL | Status: DC

## 2015-06-11 MED ORDER — OXYCODONE HCL 5 MG PO CAPS: ORAL_CAPSULE | ORAL | Status: DC

## 2015-06-11 NOTE — Progress Notes (Signed)
Safety precautions to be maintained throughout the outpatient stay will include: orient to surroundings, keep bed in low position, maintain call bell within reach at all times, provide assistance with transfer out of bed and ambulation.  

## 2015-06-11 NOTE — Patient Instructions (Signed)
PLAN  Continue present medication Norflex  oxycodone and methadone  F/U PCP Dr. Hedrick for evaliation of  BP and general medical  condition  F/U surgical evaluation. May consider pending follow-up evaluations  F/U neurological evaluation. May consider pending follow-up evaluations  May consider radiofrequency rhizolysis or intraspinal procedures pending response to present treatment and F/U evaluation   Patient to call [facility] should patient have concerns prior to scheduled return appointment. 

## 2015-06-11 NOTE — Progress Notes (Signed)
   Subjective:    Patient ID: CLESSON LAWVER, male    DOB: 12-Jun-1951, 64 y.o.   MRN: [id number]  HPI  The patient is a 64 year old gentleman who returns to pain management for further evaluation and treatment of pain involving the lower back and lower extremity region predominantly. The patient is a pain involving the region of the shoulder as well as the lesser degree. The patient denies any significant change in his condition is states the pain is fairly well controlled present treatment regimen. We discussed patient's condition including patient's diabetes mellitus which is felt to be contributing to a component of of diabetic neuropathy felt to be contributing to intraspinal abnormalities of the spine which appears to be predominant factor contributing to patient's symptomatology. We remain available to consider patient for modifications of treatment regimen pending follow-up evaluation and further assessment of patient's condition as discussed on today's visit. The patient agreed with suggested treatment plan.      Review of Systems     Objective:   Physical Exam  There was tenderness of the cervical facet cervical paraspinal musculature region of mild degree with mild tenderness of the splenius capitis and occipitalis musculature regions noted. There was mild tenderness of the acromioclavicular and glenohumeral joint region. Patient was unremarkable drop test. Patient appeared to be with bilaterally equal grip strength and Tinel and Phalen's maneuver without increased pain of significant degree. Palpation over the thoracic facet thoracic paraspinal muscles reproduced pain of mild-to-moderate degree in the lower thoracic region with no crepitus of the thoracic region noted. Palpation over the lumbar paraspinal musculature region lumbar facet region was a severe tenderness to palpation especially with extension and palpation of the lumbar facets. Lateral bending rotation reproduce severe pain  is well there was severe tenderness to palpation over the PSIS and PSIS region as well as the gluteal and piriformis musculature regions. There was mild tenderness to palpation of the greater trochanteric region and iliotibial band region. Straight leg raising was limited to approximately 20 without a definite increase of pain with dorsiflexion noted. There was negative clonus negative Homans. Presently decreased sensation of the L5 dermatomal distribution. Abdomen nontender with no costovertebral tenderness noted.    Assessment & Plan:    Degenerative disc disease lumbar spine Post surgical changes lumbar spine with L3-4 right L4-L5 moderate to severe spinal canal stenosis  Postherpetic neuralgia of lumbar lower extremity region on the left  Degenerative joint disease of shoulder  Diabetic neuropathy    PLAN   Continue present medication Norflex oxycodone and methadone  F/U PCP  Dr. Kary Kos for evaliation of  BP diabetes mellitus  and general medical  condition  F/U surgical evaluation. May consider pending follow-up evaluation. Patient is with prior surgery of the lumbar region without plans for additional surgery   F/U neurological evaluation. May consider pending follow-up evaluations  May consider radiofrequency rhizolysis or intraspinal procedures pending response to present treatment and F/U evaluation   Patient to call [facility] should patient have concerns prior to scheduled return appointment.

## 2015-07-10 ENCOUNTER — Encounter: Payer: Self-pay | Admitting: Pain Medicine

## 2015-07-10 ENCOUNTER — Ambulatory Visit: Payer: 59 | Attending: Pain Medicine | Admitting: Pain Medicine

## 2015-07-10 VITALS — BP 148/74 | HR 73 | Temp 98.1°F | Resp 18 | Ht 68.0 in | Wt 223.0 lb

## 2015-07-10 DIAGNOSIS — M19019 Primary osteoarthritis, unspecified shoulder: Secondary | ICD-10-CM | POA: Insufficient documentation

## 2015-07-10 DIAGNOSIS — M4806 Spinal stenosis, lumbar region: Secondary | ICD-10-CM | POA: Diagnosis not present

## 2015-07-10 DIAGNOSIS — M961 Postlaminectomy syndrome, not elsewhere classified: Secondary | ICD-10-CM

## 2015-07-10 DIAGNOSIS — M5137 Other intervertebral disc degeneration, lumbosacral region: Secondary | ICD-10-CM

## 2015-07-10 DIAGNOSIS — M159 Polyosteoarthritis, unspecified: Secondary | ICD-10-CM

## 2015-07-10 DIAGNOSIS — M160 Bilateral primary osteoarthritis of hip: Secondary | ICD-10-CM

## 2015-07-10 DIAGNOSIS — M47816 Spondylosis without myelopathy or radiculopathy, lumbar region: Secondary | ICD-10-CM | POA: Diagnosis not present

## 2015-07-10 DIAGNOSIS — B0229 Other postherpetic nervous system involvement: Secondary | ICD-10-CM | POA: Insufficient documentation

## 2015-07-10 DIAGNOSIS — M79604 Pain in right leg: Secondary | ICD-10-CM | POA: Diagnosis present

## 2015-07-10 DIAGNOSIS — M150 Primary generalized (osteo)arthritis: Secondary | ICD-10-CM

## 2015-07-10 DIAGNOSIS — E1340 Other specified diabetes mellitus with diabetic neuropathy, unspecified: Secondary | ICD-10-CM

## 2015-07-10 DIAGNOSIS — M545 Low back pain: Secondary | ICD-10-CM | POA: Diagnosis present

## 2015-07-10 DIAGNOSIS — E1140 Type 2 diabetes mellitus with diabetic neuropathy, unspecified: Secondary | ICD-10-CM | POA: Insufficient documentation

## 2015-07-10 DIAGNOSIS — M5136 Other intervertebral disc degeneration, lumbar region: Secondary | ICD-10-CM | POA: Insufficient documentation

## 2015-07-10 DIAGNOSIS — M79605 Pain in left leg: Secondary | ICD-10-CM | POA: Diagnosis present

## 2015-07-10 MED ORDER — OXYCODONE HCL 5 MG PO CAPS: ORAL_CAPSULE | ORAL | Status: DC

## 2015-07-10 MED ORDER — METHADONE HCL 10 MG PO TABS: ORAL_TABLET | ORAL | Status: DC

## 2015-07-10 NOTE — Patient Instructions (Signed)
PLAN  Continue present medication Norflex  oxycodone and methadone  F/U PCP Dr. Kary Kos for evaliation of  BP diabetes mellitus with and general medical  condition  F/U surgical evaluation. May consider pending follow-up evaluations  F/U neurological evaluation. May consider pending follow-up evaluations  May consider radiofrequency rhizolysis or intraspinal procedures pending response to present treatment and F/U evaluation   Patient to call [facility] should patient have concerns prior to scheduled return appointment.

## 2015-07-10 NOTE — Progress Notes (Signed)
Safety precautions to be maintained throughout the outpatient stay will include: orient to surroundings, keep bed in low position, maintain call bell within reach at all times, provide assistance with transfer out of bed and ambulation.  

## 2015-07-10 NOTE — Progress Notes (Signed)
Subjective:    Patient ID: Todd Banks, male    DOB: 1951/03/16, 64 y.o.   MRN: [id number]  HPI  The patient is a 64 year old gentleman who returns to pain management for further evaluation and treatment of pain involving the lower back and lower extremity region especially the left lower extremity region. The patient is status post surgical intervention of the lumbar region without plans for additional surgery. The patient states the left lower extremity is more bothersome. The patient is status post prior shingles of the left lower extremity in addition to prior surgical intervention of the lumbar region. The patient denies trauma change in events of daily living because change in symptomatology. The patient continues to attempt to work H that activities on the job aggravate his condition. We will continue medications as prescribed and avoid interventional treatment informed patient that we could perform interventional treatment without steroids to decrease the burning stinging paresthesias of the lower extremity. We will continue medications as prescribed this time and patient's call pain management should they be change in condition prior to scheduled return appointment. The patient was understanding and agreement suggested treatment plan. We will continue medications as discussed and remain available to proceed with interventional treatment should there be severely incapacitating pain despite noninterventional treatment measures and pending patient's willingness to proceed with the procedure      Review of Systems     Objective:   Physical Exam   There was tenderness to palpation of the splenius capitis and occipitalis musculature regions palpation which reproduces mild discomfort. Palpation of the cervical facet cervical paraspinal musculature region was with minimal tenderness to palpation with minimal tenderness to palpation over the thoracic facet thoracic paraspinal musculature  region with no crepitus of the thoracic region noted. There appeared to be unremarkable Spurling's maneuver and there was minimal tenderness to palpation of the acromioclavicular and glenohumeral joint regions Tinel and Phalen's maneuver were without increased pain of significant degree and patient appeared to be with slightly decreased grip strength. Patient was able to perform drop test without difficulty. Palpation over the lumbar paraspinal musculature region lumbar facet region was attends to palpation left greater than the right extension and palpation of the lumbar facets reproducing moderately severe discomfort. No sensory deficit or dermatomal distribution of the extremities noted there were areas of increased sensitivity to touch of the left lower extremity with straight leg raising tolerates approximately 20 without increased pain with dorsiflexion noted. DTRs were difficult to elicit patient had difficulty relaxing. No definite sensory deficit or dermatomal distribution detected. There was tenderness to palpation over the greater trochanteric region iliotibial band region of mild to moderate degree. DTRs were difficult to elicit patient had difficulty relaxing EHL strength decreased on the left compared to the right. There was negative clonus negative Homans. Abdomen nontender with no costovertebral tenderness noted         Assessment & Plan:    Degenerative disc disease lumbar spine Post surgical changes lumbar spine with L3-4 right L4-L5 moderate to severe spinal canal stenosis  Postherpetic neuralgia of lumbar lower extremity region on the left  Degenerative joint disease of shoulder  Diabetic neuropathy     PLAN  Continue present medication Norflex  oxycodone and methadone  F/U PCP Dr. Kary Kos for evaliation of  BP diabetes mellitus with and general medical  condition  F/U surgical evaluation. May consider pending follow-up evaluations  F/U neurological evaluation. May  consider pending follow-up evaluations  May consider radiofrequency rhizolysis or  intraspinal procedures pending response to present treatment and F/U evaluation   Patient to call [facility] should patient have concerns prior to scheduled return appointment.

## 2015-07-19 ENCOUNTER — Other Ambulatory Visit: Payer: Self-pay | Admitting: Pain Medicine

## 2015-08-09 ENCOUNTER — Ambulatory Visit: Payer: 59 | Attending: Pain Medicine | Admitting: Pain Medicine

## 2015-08-09 ENCOUNTER — Encounter: Payer: Self-pay | Admitting: Pain Medicine

## 2015-08-09 VITALS — BP 160/76 | HR 78 | Temp 98.1°F | Resp 16 | Ht 69.0 in | Wt 220.0 lb

## 2015-08-09 DIAGNOSIS — M150 Primary generalized (osteo)arthritis: Secondary | ICD-10-CM

## 2015-08-09 DIAGNOSIS — Z9889 Other specified postprocedural states: Secondary | ICD-10-CM | POA: Diagnosis not present

## 2015-08-09 DIAGNOSIS — E1340 Other specified diabetes mellitus with diabetic neuropathy, unspecified: Secondary | ICD-10-CM

## 2015-08-09 DIAGNOSIS — M5136 Other intervertebral disc degeneration, lumbar region: Secondary | ICD-10-CM | POA: Diagnosis not present

## 2015-08-09 DIAGNOSIS — B0229 Other postherpetic nervous system involvement: Secondary | ICD-10-CM | POA: Diagnosis not present

## 2015-08-09 DIAGNOSIS — E1140 Type 2 diabetes mellitus with diabetic neuropathy, unspecified: Secondary | ICD-10-CM | POA: Diagnosis not present

## 2015-08-09 DIAGNOSIS — M4806 Spinal stenosis, lumbar region: Secondary | ICD-10-CM | POA: Diagnosis not present

## 2015-08-09 DIAGNOSIS — M5137 Other intervertebral disc degeneration, lumbosacral region: Secondary | ICD-10-CM

## 2015-08-09 DIAGNOSIS — M19019 Primary osteoarthritis, unspecified shoulder: Secondary | ICD-10-CM | POA: Insufficient documentation

## 2015-08-09 DIAGNOSIS — M961 Postlaminectomy syndrome, not elsewhere classified: Secondary | ICD-10-CM

## 2015-08-09 DIAGNOSIS — M159 Polyosteoarthritis, unspecified: Secondary | ICD-10-CM

## 2015-08-09 DIAGNOSIS — M160 Bilateral primary osteoarthritis of hip: Secondary | ICD-10-CM

## 2015-08-09 DIAGNOSIS — M545 Low back pain: Secondary | ICD-10-CM | POA: Diagnosis present

## 2015-08-09 DIAGNOSIS — M79606 Pain in leg, unspecified: Secondary | ICD-10-CM | POA: Diagnosis present

## 2015-08-09 MED ORDER — METHADONE HCL 10 MG PO TABS: ORAL_TABLET | ORAL | Status: DC

## 2015-08-09 MED ORDER — ORPHENADRINE CITRATE ER 100 MG PO TB12: ORAL_TABLET | ORAL | Status: DC

## 2015-08-09 MED ORDER — OXYCODONE HCL 5 MG PO CAPS: ORAL_CAPSULE | ORAL | Status: DC

## 2015-08-09 NOTE — Progress Notes (Signed)
Safety precautions to be maintained throughout the outpatient stay will include: orient to surroundings, keep bed in low position, maintain call bell within reach at all times, provide assistance with transfer out of bed and ambulation.  

## 2015-08-09 NOTE — Patient Instructions (Signed)
PLAN  Continue present medication Norflex  oxycodone and methadone  F/U PCP Dr. Kary Kos for evaliation of  BP diabetes mellitus with and general medical  condition  F/U surgical evaluation. May consider pending follow-up evaluations  F/U neurological evaluation. May consider pending follow-up evaluations  May consider radiofrequency rhizolysis or intraspinal procedures pending response to present treatment and F/U evaluation   Patient to call [facility] should patient have concerns prior to scheduled return appointment.

## 2015-08-09 NOTE — Progress Notes (Signed)
   Subjective:    Patient ID: Todd Banks, male    DOB: 11-Dec-1950, 65 y.o.   MRN: [id number]  HPI The patient is a 65 year old gentleman who returns to pain management for further evaluation and treatment of pain involving the lower back and lower extremity region predominantly the patient stated that he had some increased activities at work and they had some exacerbation of his lower back pain at this time. The patient continues to tolerate medications Norflex oxycodone and methadone without undesirable side effects. Patient states he is in hopes that the pain of the lower back region will subside. We discussed patient's condition and informed patient that we would remain available to consider modifications of treatment regimen should the pain of the lower back region persist. The patient was in agreement suggested treatment plan. The patient denied any other trauma change in events of daily living the call significant change in symptomatology. We will continue medications as prescribed and we'll remain available to consider modification of treatment pending follow-up evaluation. All agreed to suggested treatment plan.   Review of Systems     Objective:   Physical Exam  There was mild tenderness to palpation of the splenius capitis and a separate talus musculature regions. There was mild tenderness over the cervical facet cervical paraspinal musculature region. Palpation of the splenius capitis and occipitalis musculature regions reproduce mild discomfort. There was mild to moderate tenderness of the acromioclavicular and glenohumeral joint regions. Patient appeared to be with slightly decreased grip strength. The patient was able to perform drop test with minimal discomfort. Palpation over the region of the thoracic facet thoracic paraspinal musculature region was attends to palpation of mild to moderate degree. There was mild to moderate tenderness to palpation over the lower thoracic paraspinal  musculature region. No crepitus of the thoracic region was noted. Palpation over the lumbar paraspinal musculatures and lumbar facet region was attends to palpation of moderately severe degree with lateral bending rotation extension and palpation of the lumbar facets reproducing severe discomfort. Straight leg raising was decreased on the left compared to the right with questionably decreased EHL strength and question decreased sensation of the L5 dermatomal distribution. There was negative clonus negative Homans. Patient with mild to moderate difficulty attempted to stand on tiptoes and heels. Palpation over the PSIS and PII S region reproduced pain of moderate degree to moderately severe degree There was moderate to moderately severe tends to palpation of the gluteal and piriformis muscles regions there was negative clonus negative Homans. Abdomen was nontender with no costovertebral tenderness noted.    Assessment & Plan:      Degenerative disc disease lumbar spine Post surgical changes lumbar spine with L3-4 right L4-L5 moderate to severe spinal canal stenosis  Postherpetic neuralgia of lumbar lower extremity region on the left  Degenerative joint disease of shoulder  Diabetic neuropathy      PLAN  Continue present medication Norflex  oxycodone and methadone  F/U PCP Dr. Kary Kos for evaliation of  BP diabetes mellitus with and general medical  condition  F/U surgical evaluation. May consider pending follow-up evaluations  F/U neurological evaluation. May consider pending follow-up evaluations  May consider radiofrequency rhizolysis or intraspinal procedures pending response to present treatment and F/U evaluation   Patient to call [facility] should patient have concerns prior to scheduled return appointment.

## 2015-08-28 ENCOUNTER — Ambulatory Visit
Admission: RE | Admit: 2015-08-28 | Discharge: 2015-08-28 | Disposition: A | Discharge status: Home / Self Care | Payer: 59 | Source: Ambulatory Visit | Attending: Cardiology | Admitting: Cardiology

## 2015-08-28 ENCOUNTER — Encounter
Admission: RE | Disposition: A | Discharge status: Home / Self Care | Payer: Self-pay | Source: Ambulatory Visit | Attending: Cardiology

## 2015-08-28 DIAGNOSIS — R339 Retention of urine, unspecified: Secondary | ICD-10-CM | POA: Diagnosis not present

## 2015-08-28 DIAGNOSIS — Z8249 Family history of ischemic heart disease and other diseases of the circulatory system: Secondary | ICD-10-CM | POA: Insufficient documentation

## 2015-08-28 DIAGNOSIS — F419 Anxiety disorder, unspecified: Secondary | ICD-10-CM | POA: Insufficient documentation

## 2015-08-28 DIAGNOSIS — Z79899 Other long term (current) drug therapy: Secondary | ICD-10-CM | POA: Diagnosis not present

## 2015-08-28 DIAGNOSIS — I259 Chronic ischemic heart disease, unspecified: Secondary | ICD-10-CM | POA: Diagnosis not present

## 2015-08-28 DIAGNOSIS — R0602 Shortness of breath: Secondary | ICD-10-CM | POA: Insufficient documentation

## 2015-08-28 DIAGNOSIS — Z803 Family history of malignant neoplasm of breast: Secondary | ICD-10-CM | POA: Diagnosis not present

## 2015-08-28 DIAGNOSIS — Z955 Presence of coronary angioplasty implant and graft: Secondary | ICD-10-CM | POA: Diagnosis not present

## 2015-08-28 DIAGNOSIS — R1013 Epigastric pain: Secondary | ICD-10-CM | POA: Insufficient documentation

## 2015-08-28 DIAGNOSIS — R079 Chest pain, unspecified: Secondary | ICD-10-CM | POA: Diagnosis present

## 2015-08-28 DIAGNOSIS — I10 Essential (primary) hypertension: Secondary | ICD-10-CM | POA: Diagnosis not present

## 2015-08-28 DIAGNOSIS — D649 Anemia, unspecified: Secondary | ICD-10-CM | POA: Insufficient documentation

## 2015-08-28 DIAGNOSIS — I4892 Unspecified atrial flutter: Secondary | ICD-10-CM | POA: Insufficient documentation

## 2015-08-28 DIAGNOSIS — I2510 Atherosclerotic heart disease of native coronary artery without angina pectoris: Secondary | ICD-10-CM | POA: Diagnosis not present

## 2015-08-28 DIAGNOSIS — K5900 Constipation, unspecified: Secondary | ICD-10-CM | POA: Diagnosis not present

## 2015-08-28 DIAGNOSIS — B351 Tinea unguium: Secondary | ICD-10-CM | POA: Insufficient documentation

## 2015-08-28 DIAGNOSIS — Z888 Allergy status to other drugs, medicaments and biological substances status: Secondary | ICD-10-CM | POA: Insufficient documentation

## 2015-08-28 DIAGNOSIS — E119 Type 2 diabetes mellitus without complications: Secondary | ICD-10-CM | POA: Diagnosis not present

## 2015-08-28 DIAGNOSIS — Z794 Long term (current) use of insulin: Secondary | ICD-10-CM | POA: Insufficient documentation

## 2015-08-28 DIAGNOSIS — F329 Major depressive disorder, single episode, unspecified: Secondary | ICD-10-CM | POA: Insufficient documentation

## 2015-08-28 DIAGNOSIS — Z833 Family history of diabetes mellitus: Secondary | ICD-10-CM | POA: Insufficient documentation

## 2015-08-28 DIAGNOSIS — Z7982 Long term (current) use of aspirin: Secondary | ICD-10-CM | POA: Insufficient documentation

## 2015-08-28 DIAGNOSIS — Z818 Family history of other mental and behavioral disorders: Secondary | ICD-10-CM | POA: Insufficient documentation

## 2015-08-28 DIAGNOSIS — Z91040 Latex allergy status: Secondary | ICD-10-CM | POA: Diagnosis not present

## 2015-08-28 DIAGNOSIS — E785 Hyperlipidemia, unspecified: Secondary | ICD-10-CM | POA: Diagnosis not present

## 2015-08-28 DIAGNOSIS — Z880 Allergy status to penicillin: Secondary | ICD-10-CM | POA: Diagnosis not present

## 2015-08-28 DIAGNOSIS — R0600 Dyspnea, unspecified: Secondary | ICD-10-CM | POA: Insufficient documentation

## 2015-08-28 DIAGNOSIS — Z8711 Personal history of peptic ulcer disease: Secondary | ICD-10-CM | POA: Insufficient documentation

## 2015-08-28 DIAGNOSIS — Z881 Allergy status to other antibiotic agents status: Secondary | ICD-10-CM | POA: Diagnosis not present

## 2015-08-28 HISTORY — PX: CARDIAC CATHETERIZATION: SHX172

## 2015-08-28 HISTORY — DX: Anxiety disorder, unspecified: F41.9

## 2015-08-28 HISTORY — DX: Constipation, unspecified: K59.00

## 2015-08-28 HISTORY — DX: Peptic ulcer, site unspecified, unspecified as acute or chronic, without hemorrhage or perforation: K27.9

## 2015-08-28 HISTORY — DX: Other specified health status: Z78.9

## 2015-08-28 HISTORY — DX: Calculus of kidney: N20.0

## 2015-08-28 SURGERY — LEFT HEART CATH AND CORONARY ANGIOGRAPHY
Anesthesia: Moderate Sedation

## 2015-08-28 MED ORDER — SODIUM CHLORIDE 0.9% FLUSH: 3.0000 mL | Freq: Two times a day (BID) | INTRAVENOUS | Status: DC

## 2015-08-28 MED ORDER — SODIUM CHLORIDE 0.9 % WEIGHT BASED INFUSION
1.0000 mL/kg/h | INTRAVENOUS | Status: DC
Start: 2015-08-29 — End: 2015-08-28

## 2015-08-28 MED ORDER — SODIUM CHLORIDE 0.9 % IV SOLN: 250.0000 mL | INTRAVENOUS | Status: DC | PRN

## 2015-08-28 MED ORDER — IOHEXOL 300 MG/ML  SOLN
INTRAMUSCULAR | Status: DC | PRN
  Administered 2015-08-28: 110 mL via INTRA_ARTERIAL

## 2015-08-28 MED ORDER — SODIUM CHLORIDE 0.9 % WEIGHT BASED INFUSION
3.0000 mL/kg/h | INTRAVENOUS | Status: DC
  Administered 2015-08-28: 3 mL/kg/h via INTRAVENOUS

## 2015-08-28 MED ORDER — MIDAZOLAM HCL 2 MG/2ML IJ SOLN
INTRAMUSCULAR | Status: DC | PRN
  Administered 2015-08-28 (×2): 0.5 mg via INTRAVENOUS
  Administered 2015-08-28: 1 mg via INTRAVENOUS

## 2015-08-28 MED ORDER — MIDAZOLAM HCL 2 MG/2ML IJ SOLN
INTRAMUSCULAR | Status: AC
  Filled 2015-08-28: qty 2

## 2015-08-28 MED ORDER — SODIUM CHLORIDE 0.9% FLUSH: 3.0000 mL | INTRAVENOUS | Status: DC | PRN

## 2015-08-28 SURGICAL SUPPLY — 9 items
CATH INFINITI 5FR ANG PIGTAIL (CATHETERS) ×3 IMPLANT
CATH INFINITI 5FR JL4 (CATHETERS) ×3 IMPLANT
CATH INFINITI JR4 5F (CATHETERS) ×3 IMPLANT
DEVICE CLOSURE MYNXGRIP 5F (Vascular Products) ×3 IMPLANT
KIT MANI 3VAL PERCEP (MISCELLANEOUS) ×3 IMPLANT
NEEDLE PERC 18GX7CM (NEEDLE) ×3 IMPLANT
PACK CARDIAC CATH (CUSTOM PROCEDURE TRAY) ×3 IMPLANT
SHEATH AVANTI 5FR X 11CM (SHEATH) ×3 IMPLANT
WIRE EMERALD 3MM-J .035X150CM (WIRE) ×3 IMPLANT

## 2015-08-28 NOTE — Discharge Instructions (Signed)
Angiogram, Care After °Refer to this sheet in the next few weeks. These instructions provide you with information about caring for yourself after your procedure. Your health care provider may also give you more specific instructions. Your treatment has been planned according to current medical practices, but problems sometimes occur. Call your health care provider if you have any problems or questions after your procedure. °WHAT TO EXPECT AFTER THE PROCEDURE °After your procedure, it is typical to have the following: °· Bruising at the catheter insertion site that usually fades within 1-2 weeks. °· Blood collecting in the tissue (hematoma) that may be painful to the touch. It should usually decrease in size and tenderness within 1-2 weeks. °HOME CARE INSTRUCTIONS °· Take medicines only as directed by your health care provider. °· You may shower 24-48 hours after the procedure or as directed by your health care provider. Remove the bandage (dressing) and gently wash the site with plain soap and water. Pat the area dry with a clean towel. Do not rub the site, because this may cause bleeding. °· Do not take baths, swim, or use a hot tub until your health care provider approves. °· Check your insertion site every day for redness, swelling, or drainage. °· Do not apply powder or lotion to the site. °· Do not lift over 10 lb (4.5 kg) for 5 days after your procedure or as directed by your health care provider. °· Ask your health care provider when it is okay to: °¨ Return to work or school. °¨ Resume usual physical activities or sports. °¨ Resume sexual activity. °· Do not drive home if you are discharged the same day as the procedure. Have someone else drive you. °· You may drive 24 hours after the procedure unless otherwise instructed by your health care provider. °· Do not operate machinery or power tools for 24 hours after the procedure or as directed by your health care provider. °· If your procedure was done as an  outpatient procedure, which means that you went home the same day as your procedure, a responsible adult should be with you for the first 24 hours after you arrive home. °· Keep all follow-up visits as directed by your health care provider. This is important. °SEEK MEDICAL CARE IF: °· You have a fever. °· You have chills. °· You have increased bleeding from the catheter insertion site. Hold pressure on the site. °SEEK IMMEDIATE MEDICAL CARE IF: °· You have unusual pain at the catheter insertion site. °· You have redness, warmth, or swelling at the catheter insertion site. °· You have drainage (other than a small amount of blood on the dressing) from the catheter insertion site. °· The catheter insertion site is bleeding, and the bleeding does not stop after 30 minutes of holding steady pressure on the site. °· The area near or just beyond the catheter insertion site becomes pale, cool, tingly, or numb. °  °This information is not intended to replace advice given to you by your health care provider. Make sure you discuss any questions you have with your health care provider. °  °Document Released: 01/16/2005 Document Revised: 07/21/2014 Document Reviewed: 12/01/2012 °[employer] Interactive Patient Education ©2016 [employer] ° °

## 2015-08-28 NOTE — Progress Notes (Signed)
[facility] Cardiology  SUBJECTIVE: I have no chest pain   Filed Vitals:   08/28/15 0701  BP: 147/80  Pulse: 68  Temp: 98.1 F (36.7 C)  TempSrc: Oral  Resp: 18  Height: 5\' 9"  (1.753 m)  Weight: 92.987 kg (205 lb)  SpO2: 94%    No intake or output data in the 24 hours ending 08/28/15 0725    PHYSICAL EXAM  General: Well developed, well nourished, in no acute distress HEENT:  Normocephalic and atramatic Neck:  No JVD.  Lungs: Clear bilaterally to auscultation and percussion. Heart: HRRR . Normal S1 and S2 without gallops or murmurs.  Abdomen: Bowel sounds are positive, abdomen soft and non-tender  Msk:  Back normal, normal gait. Normal strength and tone for age. Extremities: No clubbing, cyanosis or edema.   Neuro: Alert and oriented X 3. Psych:  Good affect, responds appropriately   LABS: Basic Metabolic Panel: No results for input(s): NA, K, CL, CO2, GLUCOSE, BUN, CREATININE, CALCIUM, MG, PHOS in the last 72 hours. Liver Function Tests: No results for input(s): AST, ALT, ALKPHOS, BILITOT, PROT, ALBUMIN in the last 72 hours. No results for input(s): LIPASE, AMYLASE in the last 72 hours. CBC: No results for input(s): WBC, NEUTROABS, HGB, HCT, MCV, PLT in the last 72 hours. Cardiac Enzymes: No results for input(s): CKTOTAL, CKMB, CKMBINDEX, TROPONINI in the last 72 hours. BNP: Invalid input(s): POCBNP D-Dimer: No results for input(s): DDIMER in the last 72 hours. Hemoglobin A1C: No results for input(s): HGBA1C in the last 72 hours. Fasting Lipid Panel: No results for input(s): CHOL, HDL, LDLCALC, TRIG, CHOLHDL, LDLDIRECT in the last 72 hours. Thyroid Function Tests: No results for input(s): TSH, T4TOTAL, T3FREE, THYROIDAB in the last 72 hours.  Invalid input(s): FREET3 Anemia Panel: No results for input(s): VITAMINB12, FOLATE, FERRITIN, TIBC, IRON, RETICCTPCT in the last 72 hours.  No results found.   Echo   TELEMETRY: Normal sinus :  ASSESSMENT AND  PLAN:  Active Problems:   * No active hospital problems. *    1. Paroxsymal atrial flutter during surgery, resolved  Recommendations  1. Continue carvedilol at current dose 2. Review 2D echo     Isaias Cowman, MD, PhD, [facility] 08/28/2015 7:25 AM

## 2015-09-04 DIAGNOSIS — Z9889 Other specified postprocedural states: Secondary | ICD-10-CM | POA: Insufficient documentation

## 2015-09-06 ENCOUNTER — Encounter: Payer: Self-pay | Admitting: Pain Medicine

## 2015-09-06 ENCOUNTER — Ambulatory Visit: Payer: 59 | Attending: Pain Medicine | Admitting: Pain Medicine

## 2015-09-06 VITALS — BP 125/71 | HR 67 | Temp 98.0°F | Resp 16 | Ht 69.0 in | Wt 208.0 lb

## 2015-09-06 DIAGNOSIS — M961 Postlaminectomy syndrome, not elsewhere classified: Secondary | ICD-10-CM

## 2015-09-06 DIAGNOSIS — M160 Bilateral primary osteoarthritis of hip: Secondary | ICD-10-CM

## 2015-09-06 DIAGNOSIS — B0229 Other postherpetic nervous system involvement: Secondary | ICD-10-CM | POA: Insufficient documentation

## 2015-09-06 DIAGNOSIS — M79606 Pain in leg, unspecified: Secondary | ICD-10-CM | POA: Diagnosis present

## 2015-09-06 DIAGNOSIS — E1140 Type 2 diabetes mellitus with diabetic neuropathy, unspecified: Secondary | ICD-10-CM | POA: Insufficient documentation

## 2015-09-06 DIAGNOSIS — M19019 Primary osteoarthritis, unspecified shoulder: Secondary | ICD-10-CM | POA: Insufficient documentation

## 2015-09-06 DIAGNOSIS — M159 Polyosteoarthritis, unspecified: Secondary | ICD-10-CM

## 2015-09-06 DIAGNOSIS — M5136 Other intervertebral disc degeneration, lumbar region: Secondary | ICD-10-CM | POA: Diagnosis not present

## 2015-09-06 DIAGNOSIS — E1340 Other specified diabetes mellitus with diabetic neuropathy, unspecified: Secondary | ICD-10-CM

## 2015-09-06 DIAGNOSIS — M150 Primary generalized (osteo)arthritis: Secondary | ICD-10-CM

## 2015-09-06 DIAGNOSIS — M47816 Spondylosis without myelopathy or radiculopathy, lumbar region: Secondary | ICD-10-CM | POA: Insufficient documentation

## 2015-09-06 DIAGNOSIS — M4806 Spinal stenosis, lumbar region: Secondary | ICD-10-CM | POA: Diagnosis not present

## 2015-09-06 DIAGNOSIS — M5137 Other intervertebral disc degeneration, lumbosacral region: Secondary | ICD-10-CM

## 2015-09-06 DIAGNOSIS — M25519 Pain in unspecified shoulder: Secondary | ICD-10-CM | POA: Diagnosis present

## 2015-09-06 DIAGNOSIS — M545 Low back pain: Secondary | ICD-10-CM | POA: Diagnosis present

## 2015-09-06 MED ORDER — ORPHENADRINE CITRATE ER 100 MG PO TB12: ORAL_TABLET | ORAL | Status: DC

## 2015-09-06 MED ORDER — METHADONE HCL 10 MG PO TABS: ORAL_TABLET | ORAL | Status: DC

## 2015-09-06 MED ORDER — OXYCODONE HCL 5 MG PO CAPS: ORAL_CAPSULE | ORAL | Status: DC

## 2015-09-06 NOTE — Progress Notes (Signed)
   Subjective:    Patient ID: Todd Banks, male    DOB: 1951/03/28, 65 y.o.   MRN: [id number]  HPI The patient is a 65 year old gentleman who returns to pain management for further evaluation and treatment of pain involving the lower back lower extremity region and region of the shoulder. The patient states that he fell over pillows level on the floor and bedroom. Patient fell on the region of the left shoulder. Patient denied any severe pain following the fall and stated that he continue present medications and severely disabling pain of the shoulder R lower back and lower extremity region at this time. The patient does admit to pain began quite severe after standing and walking for short period of time. We discussed patient's condition and we will continue Norflex oxycodone and methadone as prescribed and we'll remain available to consider modifications of treatment pending follow-up evaluation. The patient agreed to suggested treatment plan   Review of Systems     Objective:   Physical Exam  There was tenderness of the splenius capitis and occipitalis musculature region a mild degree with mild tenderness of the cervical facet cervical paraspinal musculature region. Palpation of the acromioclavicular and glenohumeral region reproduced pain of mild degree and patient appeared to be with bilaterally equal grip strength with Tinel and Phalen's maneuver reproducing of significant discomfort. Palpation over the thoracic facet thoracic paraspinal must reason was attends to palpation with no crepitus of the thoracic region noted with moderate muscle spasms of the mid and lower thoracic paraspinal musculature region. Palpation over the lumbar paraspinal muscles lumbar facet region associated with moderate discomfort with lateral bending rotation extension and palpation of the lumbar facets reproducing moderately severe discomfort. There was moderate tenderness over the PSIS and PII S region as well as the  gluteal and piriformis musculature region. Straight leg raising was tolerates approximately 20 with questionable increased pain with dorsiflexion noted. There appeared to be decreased sensation of the cervical region as well as the thoracic and lumbar region. Palpation of the greater trochanteric region iliotibial band region was with moderate to severe discomfort as well as the PSIS and PII S region. EHL strength was decreased there was tenderness of the left lower extremity compared to the right lower extremity. There was negative clonus negative Homans. Abdomen was nontender with no costovertebral tenderness    Assessment & Plan:    Degenerative disc disease lumbar spine Post surgical changes lumbar spine with L3-4 right L4-L5 moderate to severe spinal canal stenosis  Postherpetic neuralgia of lumbar lower extremity region on the left  Degenerative joint disease of shoulder  Diabetic neuropathy       PLAN  Continue present medication Norflex  oxycodone and methadone  F/U PCP Dr. Kary Kos for evaliation of  BP diabetes mellitus with and general medical  Condition  F/U Dr. Josefa Half as needed status post cardiac catheterization  F/U surgical evaluation. May consider pending follow-up evaluations  F/U neurological evaluation. May consider pending follow-up evaluations  May consider radiofrequency rhizolysis or intraspinal procedures pending response to present treatment and F/U evaluation   Patient to call [facility] Management [facility] should patient have concerns prior to scheduled return appointment.

## 2015-09-06 NOTE — Patient Instructions (Addendum)
PLAN  Continue present medication Norflex  oxycodone and methadone  F/U PCP Dr. Kary Kos for evaliation of  BP diabetes mellitus with and general medical  Condition  F/U Dr. Josefa Half as needed status post cardiac catheterization  F/U surgical evaluation. May consider pending follow-up evaluations  F/U neurological evaluation. May consider pending follow-up evaluations  May consider radiofrequency rhizolysis or intraspinal procedures pending response to present treatment and F/U evaluation   Patient to call [facility] should patient have concerns prior to scheduled return appointment.

## 2015-09-06 NOTE — Progress Notes (Signed)
Patient here for medication refill. Safety precautions to be maintained throughout the outpatient stay will include: orient to surroundings, keep bed in low position, maintain call bell within reach at all times, provide assistance with transfer out of bed and ambulation.  

## 2015-09-18 ENCOUNTER — Other Ambulatory Visit: Payer: Self-pay | Admitting: Orthopedic Surgery

## 2015-09-20 ENCOUNTER — Other Ambulatory Visit: Payer: Self-pay | Admitting: Orthopedic Surgery

## 2015-09-20 DIAGNOSIS — M5416 Radiculopathy, lumbar region: Secondary | ICD-10-CM

## 2015-09-24 ENCOUNTER — Other Ambulatory Visit: Payer: Self-pay | Admitting: Orthopedic Surgery

## 2015-10-04 ENCOUNTER — Encounter: Payer: Self-pay | Admitting: Pain Medicine

## 2015-10-04 ENCOUNTER — Ambulatory Visit: Payer: 59 | Attending: Pain Medicine | Admitting: Pain Medicine

## 2015-10-04 VITALS — BP 136/70 | HR 71 | Temp 98.2°F | Resp 16 | Ht 68.0 in | Wt 204.0 lb

## 2015-10-04 DIAGNOSIS — M150 Primary generalized (osteo)arthritis: Secondary | ICD-10-CM

## 2015-10-04 DIAGNOSIS — M961 Postlaminectomy syndrome, not elsewhere classified: Secondary | ICD-10-CM

## 2015-10-04 DIAGNOSIS — M4806 Spinal stenosis, lumbar region: Secondary | ICD-10-CM | POA: Diagnosis not present

## 2015-10-04 DIAGNOSIS — E1140 Type 2 diabetes mellitus with diabetic neuropathy, unspecified: Secondary | ICD-10-CM | POA: Insufficient documentation

## 2015-10-04 DIAGNOSIS — M159 Polyosteoarthritis, unspecified: Secondary | ICD-10-CM

## 2015-10-04 DIAGNOSIS — M545 Low back pain: Secondary | ICD-10-CM | POA: Diagnosis present

## 2015-10-04 DIAGNOSIS — M5136 Other intervertebral disc degeneration, lumbar region: Secondary | ICD-10-CM | POA: Diagnosis not present

## 2015-10-04 DIAGNOSIS — M79606 Pain in leg, unspecified: Secondary | ICD-10-CM | POA: Diagnosis present

## 2015-10-04 DIAGNOSIS — E1340 Other specified diabetes mellitus with diabetic neuropathy, unspecified: Secondary | ICD-10-CM

## 2015-10-04 DIAGNOSIS — M47816 Spondylosis without myelopathy or radiculopathy, lumbar region: Secondary | ICD-10-CM | POA: Diagnosis not present

## 2015-10-04 DIAGNOSIS — B0229 Other postherpetic nervous system involvement: Secondary | ICD-10-CM | POA: Diagnosis not present

## 2015-10-04 DIAGNOSIS — M51379 Other intervertebral disc degeneration, lumbosacral region without mention of lumbar back pain or lower extremity pain: Secondary | ICD-10-CM

## 2015-10-04 DIAGNOSIS — M19019 Primary osteoarthritis, unspecified shoulder: Secondary | ICD-10-CM | POA: Insufficient documentation

## 2015-10-04 DIAGNOSIS — M5137 Other intervertebral disc degeneration, lumbosacral region: Secondary | ICD-10-CM

## 2015-10-04 DIAGNOSIS — M160 Bilateral primary osteoarthritis of hip: Secondary | ICD-10-CM

## 2015-10-04 MED ORDER — OXYCODONE HCL 5 MG PO CAPS: ORAL_CAPSULE | ORAL | Status: DC

## 2015-10-04 MED ORDER — METHADONE HCL 10 MG PO TABS: ORAL_TABLET | ORAL | Status: DC

## 2015-10-04 NOTE — Patient Instructions (Signed)
PLAN  Continue present medications Norflex  oxycodone and methadone  F/U PCP Dr. Kary Kos for evaliation of  BP diabetes mellitus with and general medical  condition  F/U Dr. Josefa Half as needed status post cardiac catheterization  F/U surgical evaluation. May consider pending follow-up evaluations  F/U neurological evaluation. May consider pending follow-up evaluations  May consider radiofrequency rhizolysis or intraspinal procedures pending response to present treatment and F/U evaluation . We will avoid such treatment at this time  Patient to call [facility] Management Center should patient have concerns prior to scheduled return appointment.

## 2015-10-04 NOTE — Progress Notes (Signed)
Safety precautions to be maintained throughout the outpatient stay will include: orient to surroundings, keep bed in low position, maintain call bell within reach at all times, provide assistance with transfer out of bed and ambulation.  

## 2015-10-04 NOTE — Progress Notes (Signed)
   Subjective:    Patient ID: Todd Banks, male    DOB: Apr 01, 1951, 65 y.o.   MRN: [id number]  HPI  The patient is a 65 year old gentleman who returns to pain management for further evaluation and treatment of pain involving the lower back and lower extremity region predominantly. Patient also is with history of pain involving the region of the shoulder. The patient is tolerating medications well consisting of Norflex oxycodone and methadone. The patient denies any undesirable side effects from the medications. At the present time we will avoid interventional treatment and will continue medications as prescribed. The patient is scheduled to undergo lumbar MRI. We may consider interventional treatment as well as additional modifications of treatment regimen pending results of lumbar MRI. The patient was with understanding and in agreement with suggested treatment plan    Review of Systems     Objective:   Physical Exam  There was tenderness over the region of the splenius capitis and occipitalis musculature regions. Palpation of these regions reproduced pain of mild-to-moderate degree. There was mild to moderate tenderness of the acromioclavicular and glenohumeral joint region. Palpation over the cervical facet cervical paraspinal musculature region was with mild tenderness to palpation. Palpation over the thoracic facet thoracic paraspinal musculature region was with mild to moderate discomfort. No crepitus of the thoracic region was noted. Tinel and Phalen's maneuver were without increase of pain of significant degree. Grip strength appeared to be equal. Palpation of the lumbar paraspinal musculatures and lumbar facet region was with significantly limited range of motion of the lumbar spine. Lateral bending rotation extension and palpation of the lumbar facets reproduced severe pain. Straight leg raise was tolerates approximately 20 without a definite increase of pain with dorsiflexion noted. EHL  strength was decreased. There was questionably decreased sensation along the L5 dermatomal distribution detected. There was negative clonus negative Homans. Mild tenderness to palpation of the knees with negative anterior and posterior drawer signs with no ballottement of the patella. DTRs were difficult to elicit. There was negative clonus negative Homans. Abdomen was without excessive tends to palpation and no costovertebral tenderness noted      Assessment & Plan:    Degenerative disc disease lumbar spine Post surgical changes lumbar spine with L3-4 right L4-L5 moderate to severe spinal canal stenosis  Postherpetic neuralgia of lumbar lower extremity region on the left  Degenerative joint disease of shoulder  Diabetic neuropathy      PLAN  Continue present medications Norflex  oxycodone and methadone  F/U PCP Dr. Kary Kos for evaliation of  BP diabetes mellitus with and general medical  condition  F/U Dr. Josefa Half as needed status post cardiac catheterization  F/U surgical evaluation. May consider pending follow-up evaluations  Lumbar MRI as scheduled. As discussed with patient we may consider interventional treatment and other modifications of treatment regimen pending results of lumbar MRI and follow-up evaluation  F/U neurological evaluation. May consider pending follow-up evaluations  May consider radiofrequency rhizolysis or intraspinal procedures pending response to present treatment and F/U evaluation . We will avoid such treatment at this time  Patient to call Pain Management Center should patient have concerns prior to scheduled return appointment.

## 2015-10-05 ENCOUNTER — Ambulatory Visit: Admission: RE | Admit: 2015-10-05 | Payer: 59 | Source: Ambulatory Visit

## 2015-10-23 ENCOUNTER — Other Ambulatory Visit: Payer: Self-pay | Admitting: Pain Medicine

## 2015-10-26 ENCOUNTER — Ambulatory Visit: Admission: RE | Admit: 2015-10-26 | Payer: 59 | Source: Ambulatory Visit

## 2015-10-31 ENCOUNTER — Encounter: Payer: Self-pay | Admitting: Pain Medicine

## 2015-10-31 ENCOUNTER — Ambulatory Visit: Payer: 59 | Attending: Pain Medicine | Admitting: Pain Medicine

## 2015-10-31 VITALS — BP 130/62 | HR 77 | Temp 98.0°F | Resp 16 | Ht 69.0 in | Wt 220.0 lb

## 2015-10-31 DIAGNOSIS — M19019 Primary osteoarthritis, unspecified shoulder: Secondary | ICD-10-CM | POA: Diagnosis not present

## 2015-10-31 DIAGNOSIS — M79606 Pain in leg, unspecified: Secondary | ICD-10-CM | POA: Diagnosis present

## 2015-10-31 DIAGNOSIS — M159 Polyosteoarthritis, unspecified: Secondary | ICD-10-CM

## 2015-10-31 DIAGNOSIS — E1340 Other specified diabetes mellitus with diabetic neuropathy, unspecified: Secondary | ICD-10-CM

## 2015-10-31 DIAGNOSIS — B0229 Other postherpetic nervous system involvement: Secondary | ICD-10-CM | POA: Diagnosis not present

## 2015-10-31 DIAGNOSIS — M545 Low back pain: Secondary | ICD-10-CM | POA: Diagnosis present

## 2015-10-31 DIAGNOSIS — M5137 Other intervertebral disc degeneration, lumbosacral region: Secondary | ICD-10-CM

## 2015-10-31 DIAGNOSIS — M5136 Other intervertebral disc degeneration, lumbar region: Secondary | ICD-10-CM | POA: Diagnosis not present

## 2015-10-31 DIAGNOSIS — M150 Primary generalized (osteo)arthritis: Secondary | ICD-10-CM

## 2015-10-31 DIAGNOSIS — M4806 Spinal stenosis, lumbar region: Secondary | ICD-10-CM | POA: Diagnosis not present

## 2015-10-31 DIAGNOSIS — M961 Postlaminectomy syndrome, not elsewhere classified: Secondary | ICD-10-CM

## 2015-10-31 DIAGNOSIS — M47816 Spondylosis without myelopathy or radiculopathy, lumbar region: Secondary | ICD-10-CM | POA: Diagnosis not present

## 2015-10-31 DIAGNOSIS — E1140 Type 2 diabetes mellitus with diabetic neuropathy, unspecified: Secondary | ICD-10-CM | POA: Diagnosis not present

## 2015-10-31 DIAGNOSIS — M160 Bilateral primary osteoarthritis of hip: Secondary | ICD-10-CM

## 2015-10-31 MED ORDER — ORPHENADRINE CITRATE ER 100 MG PO TB12: ORAL_TABLET | ORAL | Status: DC

## 2015-10-31 MED ORDER — OXYCODONE HCL 5 MG PO CAPS: ORAL_CAPSULE | ORAL | Status: DC

## 2015-10-31 MED ORDER — METHADONE HCL 10 MG PO TABS: ORAL_TABLET | ORAL | Status: DC

## 2015-10-31 NOTE — Progress Notes (Signed)
   Subjective:    Patient ID: Todd Banks, male    DOB: 1950-10-13, 65 y.o.   MRN: [id number]  HPI  The patient is a 65 year old gentleman who returns to pain management for further evaluation and treatment of pain involving the mid lower back lower extremity region and shoulder. The patient states that the pain is fairly well-controlled present time. The patient states that he has some symptoms of URI at this time. We have advised patient to follow-up with primary care physician Dr. Kary Kos. We will continue medications as prescribed medications consisting of Norflex oxycodone and methadone. Patient denies any drowsiness confusion excessive sedation or other side effects due to the medication. The patient continues to be with lower back and lower extremity pain and we discussed patient having lumbar MRI performed. Lumbar MRI has been scheduled and patient states that he will undergo lumbar MRI in May. We will consider modifications of treatment regimen pending results of MRI and follow-up evaluation. The patient was with understanding and agreed to suggested treatment plan  Review of Systems     Objective:   Physical Exam  There was tenderness of the splenius capitis and occipitalis musculature regions of mild degree with mild tenderness of the cervical facet cervical paraspinal musculature region. There was mild to moderate tenderness of the acromioclavicular and glenohumeral joint region and patient appeared to be with unremarkable Spurling's maneuver and unremarkable drop test. The patient appeared to be with slightly decreased grip strength. Tinel and Phalen's maneuver were without increase of pain of significant degree. Palpation over the thoracic region thoracic facet region was attends to palpation and evidence of muscle spasm of moderate degree in the mid and lower thoracic region with no crepitus of the thoracic region noted. Palpation over the lumbar paraspinal muscular treat and lumbar  facet region was attends to palpation of moderate to moderately severe degree with lateral bending rotation extension and palpation of the lumbar facets reproducing severe discomfort. There was severe tends to palpation of the PSIS and PII S region as well as the gluteal and piriformis musculature regions. Straight raising was tolerates approximately 20 without a definite increased pain with dorsiflexion noted. Patient had difficulty attending to stand on tiptoes and heels. There was tenderness over the greater trochanteric region and iliotibial band region a mild to moderate degree. There was negative clonus negative Homans. Abdomen nontender with no costovertebral tenderness noted      Assessment & Plan:     Degenerative disc disease lumbar spine Post surgical changes lumbar spine with L3-4 right L4-L5 moderate to severe spinal canal stenosis  Lumbar facet syndrome  Postherpetic neuralgia of lumbar lower extremity region on the left  Degenerative joint disease of shoulder  Diabetic neuropathy      PLAN  Continue present medications Norflex  oxycodone and methadone  F/U PCP Dr. Kary Kos for evaliation of  BP  URI symptoms diabetes mellitus with and general medical  condition  F/U Dr. Josefa Half as discussed  F/U surgical evaluation. May consider pending follow-up evaluations  MRI of the lumbar spine as planned  F/U neurological evaluation. May consider PNCV/EMG studies and other studies pending follow-up evaluations  May consider radiofrequency rhizolysis or intraspinal procedures pending response to present treatment and F/U evaluation . We will avoid such treatment at this time  Patient to call Pain Management Center should patient have concerns prior to scheduled return appointment.

## 2015-10-31 NOTE — Patient Instructions (Signed)
PLAN  Continue present medications Norflex  oxycodone and methadone  F/U PCP Dr. Kary Kos for evaliation of  BP diabetes mellitus with and general medical  condition  F/U Dr. Josefa Half as discussed  F/U surgical evaluation. May consider PNCV/EMG studies and other studies pending follow-up evaluations  F/U neurological evaluation. May consider pending follow-up evaluations  May consider radiofrequency rhizolysis or intraspinal procedures pending response to present treatment and F/U evaluation . We will avoid such treatment at this time  Patient to call [facility] Management Center should patient have concerns prior to scheduled return appointment.

## 2015-10-31 NOTE — Progress Notes (Signed)
Patient here for medication management, needs refill on methadone and Oxy IR and orphenadrine Safety precautions to be maintained throughout the outpatient stay will include: orient to surroundings, keep bed in low position, maintain call bell within reach at all times, provide assistance with transfer out of bed and ambulation.

## 2015-11-21 ENCOUNTER — Ambulatory Visit: Payer: 59

## 2015-11-29 ENCOUNTER — Ambulatory Visit: Payer: 59 | Attending: Pain Medicine | Admitting: Pain Medicine

## 2015-11-29 ENCOUNTER — Encounter: Payer: Self-pay | Admitting: Pain Medicine

## 2015-11-29 ENCOUNTER — Ambulatory Visit: Payer: Medicare Other | Admitting: Pain Medicine

## 2015-11-29 VITALS — BP 138/62 | HR 56 | Temp 98.2°F | Resp 16 | Ht 69.0 in | Wt 215.0 lb

## 2015-11-29 DIAGNOSIS — M79606 Pain in leg, unspecified: Secondary | ICD-10-CM | POA: Diagnosis present

## 2015-11-29 DIAGNOSIS — M51379 Other intervertebral disc degeneration, lumbosacral region without mention of lumbar back pain or lower extremity pain: Secondary | ICD-10-CM

## 2015-11-29 DIAGNOSIS — B0229 Other postherpetic nervous system involvement: Secondary | ICD-10-CM | POA: Insufficient documentation

## 2015-11-29 DIAGNOSIS — E1140 Type 2 diabetes mellitus with diabetic neuropathy, unspecified: Secondary | ICD-10-CM | POA: Insufficient documentation

## 2015-11-29 DIAGNOSIS — M5136 Other intervertebral disc degeneration, lumbar region: Secondary | ICD-10-CM | POA: Diagnosis not present

## 2015-11-29 DIAGNOSIS — M961 Postlaminectomy syndrome, not elsewhere classified: Secondary | ICD-10-CM

## 2015-11-29 DIAGNOSIS — M159 Polyosteoarthritis, unspecified: Secondary | ICD-10-CM

## 2015-11-29 DIAGNOSIS — M549 Dorsalgia, unspecified: Secondary | ICD-10-CM | POA: Diagnosis present

## 2015-11-29 DIAGNOSIS — M150 Primary generalized (osteo)arthritis: Secondary | ICD-10-CM

## 2015-11-29 DIAGNOSIS — M25519 Pain in unspecified shoulder: Secondary | ICD-10-CM | POA: Diagnosis present

## 2015-11-29 DIAGNOSIS — E1340 Other specified diabetes mellitus with diabetic neuropathy, unspecified: Secondary | ICD-10-CM

## 2015-11-29 DIAGNOSIS — M19019 Primary osteoarthritis, unspecified shoulder: Secondary | ICD-10-CM | POA: Diagnosis not present

## 2015-11-29 DIAGNOSIS — M160 Bilateral primary osteoarthritis of hip: Secondary | ICD-10-CM

## 2015-11-29 DIAGNOSIS — M5137 Other intervertebral disc degeneration, lumbosacral region: Secondary | ICD-10-CM

## 2015-11-29 DIAGNOSIS — M4806 Spinal stenosis, lumbar region: Secondary | ICD-10-CM | POA: Diagnosis not present

## 2015-11-29 MED ORDER — OXYCODONE HCL 5 MG PO CAPS: ORAL_CAPSULE | ORAL | Status: DC

## 2015-11-29 MED ORDER — ORPHENADRINE CITRATE ER 100 MG PO TB12: ORAL_TABLET | ORAL | Status: DC

## 2015-11-29 MED ORDER — METHADONE HCL 10 MG PO TABS: ORAL_TABLET | ORAL | Status: DC

## 2015-11-29 NOTE — Patient Instructions (Addendum)
PLAN  Continue present medications Norflex  oxycodone and methadone  F/U PCP Dr. Kary Kos for evaliation of  BP diabetes mellitus with and general medical  condition  F/U Dr. Josefa Half  F/U surgical evaluation. May consider PNCV/EMG studies and other studies pending follow-up evaluations  F/U neurological evaluation. May consider pending follow-up evaluations  May consider radiofrequency rhizolysis or intraspinal procedures pending response to present treatment and F/U evaluation . We will avoid such treatment at this time  Patient to call Pain Management Center should patient have concerns prior to scheduled return appointme nt.

## 2015-11-29 NOTE — Progress Notes (Signed)
Safety precautions to be maintained throughout the outpatient stay will include: orient to surroundings, keep bed in low position, maintain call bell within reach at all times, provide assistance with transfer out of bed and ambulation.  

## 2015-11-29 NOTE — Progress Notes (Signed)
   Subjective:    Patient ID: Todd Banks, male    DOB: [birth year], 65 y.o.   MRN: [id number]  HPI  The patient is a 65 year old gentleman who returns to pain management for further evaluation and treatment of pain involving the shoulder entire back upper and lower extremity region. The patient has had some increase lower back lower extremity pain with some weakness of the lower extremity. We have discussed interventional treatment and at the present time patient will follow-up with primary care physician discusses diabetes mellitus and May consider lumbar epidural steroid injection provided that the diabetes mellitus is well controlled. The patient states the pain becomes more intense as the day progresses and interferes with activities of daily living with lower back associated pain associated with weakness of the lower extremity. The patient continues medications consisting of methadone oxycodone and Norflex which he been a significant benefit in terms of reducing patient's symptoms. We will remain available to consider lumbar epidural steroid injection and other treatment pending further assessment of patient's condition. All agreed to suggested treatment plan.     Review of Systems     Objective:   Physical Exam  There was mild tenderness of the splenius capitis and occipitalis musculature region with palpation of the acromioclavicular and glenohumeral joint region reproducing moderate discomfort. Patient was with minimal increase of pain with Tinel and Phalen's maneuver and appeared to be with bilaterally equal grip strength. There was tends to palpation of the thoracic facet thoracic paraspinal musculature region of mild to moderate degree with moderate muscle spasms noted in the thoracic paraspinal musculature region of the lower region. No crepitus of the thoracic region was noted. Palpation over the lumbar paraspinal musculatures and lumbar facet region was attends to palpation of  moderate degree to moderately severe degree with lateral bending rotation extension and palpation of the lumbar facets reproducing moderately severe discomfort. EHL strength appeared to be decreased. There was severely decreased straight leg raising on the left foot to the right with decreased EHL strength. There was questionably decreased sensation of the L5 dermatomal distribution. There was negative clonus negative Homans. Abdomen nontender with no costovertebral tenderness noted.    Assessment & Plan:     Degenerative disc disease lumbar spine Post surgical changes lumbar spine with L3-4 right L4-L5 moderate to severe spinal canal stenosis  Lumbar facet syndrome  Postherpetic neuralgia of lumbar lower extremity region on the left  Degenerative joint disease of shoulder  Diabetic neuropathy        PLAN  Continue present medications Norflex  oxycodone and methadone  F/U PCP Todd Banks for evaliation of  BP diabetes mellitus with and general medical  Condition. May consider lumbar epidural steroid injection for treatment of patient's lumbar and lower extremity pain paresthesias and weakness pending optimization of control of patient's diabetes mellitus and medical clearance for patient to undergo lumbar epidural steroid injection if patient wishes to proceed with the procedure as discussed  F/U Todd Banks  F/U surgical evaluation. May consider PNCV/EMG studies and other studies pending follow-up evaluations  F/U neurological evaluation. May consider pending follow-up evaluations  May consider radiofrequency rhizolysis or intraspinal procedures pending response to present treatment and F/U evaluation . We will avoid such treatment at this time  Patient to call Pain Management Center should patient have concerns prior to scheduled return appointment

## 2015-12-07 ENCOUNTER — Ambulatory Visit
Admission: RE | Admit: 2015-12-07 | Discharge: 2015-12-07 | Disposition: A | Discharge status: Home / Self Care | Payer: 59 | Source: Ambulatory Visit | Attending: Orthopedic Surgery | Admitting: Orthopedic Surgery

## 2015-12-07 DIAGNOSIS — M4327 Fusion of spine, lumbosacral region: Secondary | ICD-10-CM | POA: Insufficient documentation

## 2015-12-07 DIAGNOSIS — M5416 Radiculopathy, lumbar region: Secondary | ICD-10-CM | POA: Insufficient documentation

## 2015-12-07 DIAGNOSIS — M4806 Spinal stenosis, lumbar region: Secondary | ICD-10-CM | POA: Insufficient documentation

## 2015-12-07 LAB — POCT I-STAT CREATININE: Creatinine, Ser: 0.9 mg/dL (ref 0.61–1.24)

## 2015-12-07 MED ORDER — GADOBENATE DIMEGLUMINE 529 MG/ML IV SOLN
20.0000 mL | Freq: Once | INTRAVENOUS | Status: AC | PRN
  Administered 2015-12-07: 19 mL via INTRAVENOUS

## 2015-12-27 ENCOUNTER — Encounter: Payer: Medicare Other | Admitting: Pain Medicine

## 2015-12-31 ENCOUNTER — Encounter: Payer: Self-pay | Admitting: Pain Medicine

## 2015-12-31 ENCOUNTER — Ambulatory Visit: Payer: 59 | Attending: Pain Medicine | Admitting: Pain Medicine

## 2015-12-31 VITALS — BP 137/94 | HR 69 | Temp 98.1°F | Resp 16 | Ht 69.0 in | Wt 204.0 lb

## 2015-12-31 DIAGNOSIS — B0229 Other postherpetic nervous system involvement: Secondary | ICD-10-CM | POA: Diagnosis not present

## 2015-12-31 DIAGNOSIS — M5126 Other intervertebral disc displacement, lumbar region: Secondary | ICD-10-CM | POA: Insufficient documentation

## 2015-12-31 DIAGNOSIS — M19019 Primary osteoarthritis, unspecified shoulder: Secondary | ICD-10-CM | POA: Diagnosis not present

## 2015-12-31 DIAGNOSIS — M5137 Other intervertebral disc degeneration, lumbosacral region: Secondary | ICD-10-CM

## 2015-12-31 DIAGNOSIS — E1140 Type 2 diabetes mellitus with diabetic neuropathy, unspecified: Secondary | ICD-10-CM | POA: Diagnosis not present

## 2015-12-31 DIAGNOSIS — M4806 Spinal stenosis, lumbar region: Secondary | ICD-10-CM | POA: Insufficient documentation

## 2015-12-31 DIAGNOSIS — M159 Polyosteoarthritis, unspecified: Secondary | ICD-10-CM

## 2015-12-31 DIAGNOSIS — M150 Primary generalized (osteo)arthritis: Secondary | ICD-10-CM

## 2015-12-31 DIAGNOSIS — M5136 Other intervertebral disc degeneration, lumbar region: Secondary | ICD-10-CM | POA: Diagnosis not present

## 2015-12-31 DIAGNOSIS — M160 Bilateral primary osteoarthritis of hip: Secondary | ICD-10-CM

## 2015-12-31 DIAGNOSIS — Z9889 Other specified postprocedural states: Secondary | ICD-10-CM | POA: Diagnosis not present

## 2015-12-31 DIAGNOSIS — E1340 Other specified diabetes mellitus with diabetic neuropathy, unspecified: Secondary | ICD-10-CM

## 2015-12-31 DIAGNOSIS — Z981 Arthrodesis status: Secondary | ICD-10-CM | POA: Diagnosis not present

## 2015-12-31 DIAGNOSIS — M25512 Pain in left shoulder: Secondary | ICD-10-CM | POA: Diagnosis present

## 2015-12-31 DIAGNOSIS — M25511 Pain in right shoulder: Secondary | ICD-10-CM | POA: Diagnosis present

## 2015-12-31 DIAGNOSIS — M961 Postlaminectomy syndrome, not elsewhere classified: Secondary | ICD-10-CM

## 2015-12-31 MED ORDER — OXYCODONE HCL 5 MG PO CAPS
ORAL_CAPSULE | ORAL | Status: DC
Start: 2015-12-31 — End: 2016-02-04

## 2015-12-31 MED ORDER — METHADONE HCL 10 MG PO TABS: ORAL_TABLET | ORAL | Status: DC

## 2015-12-31 NOTE — Progress Notes (Signed)
Safety precautions to be maintained throughout the outpatient stay will include: orient to surroundings, keep bed in low position, maintain call bell within reach at all times, provide assistance with transfer out of bed and ambulation.  

## 2015-12-31 NOTE — Patient Instructions (Addendum)
PLAN  Continue present medications Norflex  oxycodone and methadone  Lumbar epidural steroid injection to be performed at time of return appointment pending medical clearance by Dr. Kary Kos or Dr. Gabriel Carina  F/U PCP Dr. Kary Kos for evaluation of  BP diabetes mellitus with and general medical  condition  F/U Dr. Josefa Half  F/U surgical evaluation. Please ask the nurses and secretary at the date of your neurosurgical evaluation  F/U neurological evaluation. May consider PNCV/EMG studies and other studies pending follow-up evaluations  May consider radiofrequency rhizolysis or intraspinal procedures pending response to present treatment and F/U evaluation . We will avoid such treatment at this time  Patient to call Pain Management Center should patient have concerns prior to scheduled return appointment.

## 2015-12-31 NOTE — Progress Notes (Signed)
Subjective:    Patient ID: Todd Banks, male    DOB: [birth year], 65 y.o.   MRN: [id number]  HPI  The patient is a 65 year old gentleman who returns to pain management for further evaluation and treatment of pain involving the region of the shoulders entire back upper and lower extremity regions. The patient states that he has had the return of severe pain involving the lower back lower extremity region with pain radiating to the lower extremity in with pain interfering with ability to stand or walk for significant period of time. The patient is undergone updated lumbar MRI which we reviewed on today's visit and we have decided to schedule patient for lumbar epidural steroid injection at time return appointment for what appears to be lumbar stenosis with neurogenic claudication contributing to patient's symptoms. The patient will also undergo further surgical evaluation as discussed and we will remain available to consider patient for additional modifications of treatment pending follow-up evaluations and pending response to present treatment. We will continue Norflex oxycodone and methadone as prescribed at this time. And proceed with lumbar epidural steroid injection at time of return appointment. The patient was with understanding and agreed to suggested treatment plan  Review of Systems     Objective:   Physical Exam   There was tenderness over the cervical facet cervical paraspinal musculature region a mild degree with mild tenderness of the splenius capitis and occipitalis region a mild tenderness of the acromioclavicular and glenohumeral joint regions. The patient appeared to be with bilaterally equal grip strength with Tinel and Phalen's maneuver reproducing mild discomfort. There was tenderness to palpation of the acromioclavicular and glenohumeral joint regions of mild degree palpation over the lumbar region lumbar facet region was a severe tenderness to palpation with lateral bending  rotation extension and palpation over the lumbar facets reproducing severe discomfort. There was decreased EHL strength was decreased straight leg raising tolerates approximately 20 with what appeared to be decreased sensation of the L5 dermatomal distribution with negative clonus negative Homans. There was tenderness of the PSIS and PII S region of mild to moderate degree. There was negative clonus negative Homans. Abdomen nontender with no costovertebral tenderness noted     Assessment & Plan:      Degenerative disc disease lumbar spine 1. Stable findings at L5-S1 status post laminectomy and interbody fusion. There is no residual spinal stenosis or nerve root encroachment. 2. Stable mild spinal stenosis and mild narrowing of the lateral recesses at L4-5 status post partial posterior decompression. No nerve root encroachment. 3. Mildly progressive disc bulging at L3-4 with resulting mild spinal stenosis and mild narrowing of the lateral recesses. 4. No significant foraminal narrowing or definite nerve root Encroachment.  Lumbar stenosis with neurogenic claudication  Lumbar facet syndrome  Postherpetic neuralgia of lumbar lower extremity region on the left  Degenerative joint disease of shoulder  Diabetic neuropathy     PLAN  Continue present medications Norflex  oxycodone and methadone  Lumbar epidural steroid injection to be performed at time of return appointment pending medical clearance by Dr. Kary Kos or Dr. Gabriel Carina  F/U PCP Dr. Kary Kos for evaluation of  BP diabetes mellitus with and general medical  condition  F/U Dr. Josefa Half  F/U surgical evaluation. Please ask the nurses and secretary at the date of your neurosurgical evaluation  F/U neurological evaluation. May consider PNCV/EMG studies and other studies pending follow-up evaluations  May consider radiofrequency rhizolysis or intraspinal procedures pending response to present treatment and F/U  evaluation . We  will avoid such treatment at this time  Patient to call [facility] should patient have concerns prior to scheduled return appointment.

## 2016-01-21 ENCOUNTER — Ambulatory Visit: Payer: 59 | Attending: Pain Medicine | Admitting: Pain Medicine

## 2016-01-21 ENCOUNTER — Encounter: Payer: Self-pay | Admitting: Pain Medicine

## 2016-01-21 DIAGNOSIS — E1140 Type 2 diabetes mellitus with diabetic neuropathy, unspecified: Secondary | ICD-10-CM | POA: Diagnosis not present

## 2016-01-21 DIAGNOSIS — Z9889 Other specified postprocedural states: Secondary | ICD-10-CM | POA: Diagnosis not present

## 2016-01-21 DIAGNOSIS — M961 Postlaminectomy syndrome, not elsewhere classified: Secondary | ICD-10-CM

## 2016-01-21 DIAGNOSIS — M5126 Other intervertebral disc displacement, lumbar region: Secondary | ICD-10-CM | POA: Diagnosis not present

## 2016-01-21 DIAGNOSIS — M62830 Muscle spasm of back: Secondary | ICD-10-CM | POA: Diagnosis not present

## 2016-01-21 DIAGNOSIS — Z981 Arthrodesis status: Secondary | ICD-10-CM | POA: Insufficient documentation

## 2016-01-21 DIAGNOSIS — M5136 Other intervertebral disc degeneration, lumbar region: Secondary | ICD-10-CM | POA: Diagnosis not present

## 2016-01-21 DIAGNOSIS — M19019 Primary osteoarthritis, unspecified shoulder: Secondary | ICD-10-CM | POA: Insufficient documentation

## 2016-01-21 DIAGNOSIS — M4806 Spinal stenosis, lumbar region: Secondary | ICD-10-CM | POA: Insufficient documentation

## 2016-01-21 DIAGNOSIS — B0229 Other postherpetic nervous system involvement: Secondary | ICD-10-CM | POA: Insufficient documentation

## 2016-01-21 DIAGNOSIS — M159 Polyosteoarthritis, unspecified: Secondary | ICD-10-CM

## 2016-01-21 DIAGNOSIS — M150 Primary generalized (osteo)arthritis: Secondary | ICD-10-CM

## 2016-01-21 DIAGNOSIS — M545 Low back pain: Secondary | ICD-10-CM | POA: Diagnosis present

## 2016-01-21 DIAGNOSIS — M5137 Other intervertebral disc degeneration, lumbosacral region: Secondary | ICD-10-CM

## 2016-01-21 DIAGNOSIS — E1340 Other specified diabetes mellitus with diabetic neuropathy, unspecified: Secondary | ICD-10-CM

## 2016-01-21 DIAGNOSIS — M160 Bilateral primary osteoarthritis of hip: Secondary | ICD-10-CM

## 2016-01-21 MED ORDER — ORPHENADRINE CITRATE 30 MG/ML IJ SOLN
60.0000 mg | Freq: Once | INTRAMUSCULAR | Status: DC
Start: 2016-01-21 — End: 2016-01-21

## 2016-01-21 MED ORDER — LIDOCAINE HCL (PF) 1 % IJ SOLN: 10.0000 mL | Freq: Once | INTRAMUSCULAR | Status: DC

## 2016-01-21 MED ORDER — TRIAMCINOLONE ACETONIDE 40 MG/ML IJ SUSP: 40.0000 mg | Freq: Once | INTRAMUSCULAR | Status: DC

## 2016-01-21 MED ORDER — LACTATED RINGERS IV SOLN: 1000.0000 mL | INTRAVENOUS | Status: DC

## 2016-01-21 MED ORDER — AZITHROMYCIN 250 MG PO TABS: ORAL_TABLET | ORAL | Status: DC

## 2016-01-21 MED ORDER — MIDAZOLAM HCL 5 MG/5ML IJ SOLN: 5.0000 mg | Freq: Once | INTRAMUSCULAR | Status: DC

## 2016-01-21 MED ORDER — FENTANYL CITRATE (PF) 100 MCG/2ML IJ SOLN: 100.0000 ug | Freq: Once | INTRAMUSCULAR | Status: DC

## 2016-01-21 MED ORDER — BUPIVACAINE HCL (PF) 0.25 % IJ SOLN
30.0000 mL | Freq: Once | INTRAMUSCULAR | Status: DC
Start: 2016-01-21 — End: 2016-01-21

## 2016-01-21 MED ORDER — SODIUM CHLORIDE 0.9% FLUSH
20.0000 mL | Freq: Once | INTRAVENOUS | Status: DC
Start: 2016-01-21 — End: 2016-01-21

## 2016-01-21 NOTE — Progress Notes (Signed)
[address] [address] the end of June 2017 in the house; tripped over rug; went down on left side and is still sore.

## 2016-01-21 NOTE — Progress Notes (Signed)
Subjective:    Patient ID: Todd Banks, male    DOB: Jul 27, 1950, 65 y.o.   MRN: [id number]  HPI  The patient is a 65 year old gentleman who returns to pain management for further evaluation and treatment of pain involving the lumbar lower extremity region predominantly the patient was scheduled to undergo lumbar epidural steroid injection. The patient felt to stop his anticoagulant therapy. Decision was made to counsel patient's procedure. We will have patient undergo follow-up evaluation with Dr.Parachos and Dr. Bertrum Sol and we will await medical approval from patient's physicians and will proceed with lumbar epidural steroid injection at time return appointment. All goals as planned. The patient denies any trauma change in events of daily living because no change in symptomatology. The patient states that the pain is quite severe with standing walking and radiates to the lower extremity left greater than right. We will proceed with lumbar epidural steroid injection at time return appointment as discussed pending medical clearance and patient will continue Norflex oxycodone and methadone as prescribed. All agreed to suggested treatment plan  Review of Systems     Objective:   Physical Exam   There was tenderness of the splenius capitis and occipitalis region a moderate degree with moderate tenderness of the cervical and thoracic facet thoracic paraspinal musculature region without crepitus of the thoracic region noted. Palpation of the acromioclavicular and glenohumeral joint regions reproduce moderate discomfort and patient was with mild difficulty performing drop test. Patient appeared to be with bilaterally equal grip strength with Tinel and Phalen's maneuver reproducing minimal discomfort. Palpation over the lower thoracic region was with moderate muscle spasm noted. Lateral bending rotation was associated with severe increase of pain with extension and palpation of the lumbar facets  reproducing severe pain. Straight leg raising was limited to 20 with increased pain with dorsiflexion noted. EHL strength was decreased. There was questionably decreased sensation of the L5 dermatomal distribution with negative clonus negative Homans. Knees were with crepitus with negative anterior and posterior drawer signs without ballottement of the patella. There was negative clonus negative Homans and abdomen was nontender with no costovertebral angle tenderness noted.     Assessment & Plan:        Degenerative disc disease lumbar spine 1. Stable findings at L5-S1 status post laminectomy and interbody fusion. There is no residual spinal stenosis or nerve root encroachment. 2. Stable mild spinal stenosis and mild narrowing of the lateral recesses at L4-5 status post partial posterior decompression. No nerve root encroachment. 3. Mildly progressive disc bulging at L3-4 with resulting mild spinal stenosis and mild narrowing of the lateral recesses. 4. No significant foraminal narrowing or definite nerve root Encroachment.  Lumbar stenosis with neurogenic claudication  Lumbar facet syndrome  Postherpetic neuralgia of lumbar lower extremity region on the left  Degenerative joint disease of shoulder  Diabetic neuropathy     PLAN  Continue present medications Norflex  oxycodone and methadone   Lumbar epidural steroid injection to be performed at time of return appointment  F/U PCP Dr. Kary Kos for evaluation of  BP diabetes mellitus with and general medical condition . We need medical clearance from Dr. Kary Kos or Dr.Solum to perform lumbar epidural steroid injection  Please go to the lab to have PT PTT and INR drawn on the morning of plan to perform lumbar epidural steroid injection  F/U Dr. Josefa Half. We need medical clearance from Dr.Parachos to hold anticoagulant therapy for lumbar epidural steroid injection   F/U surgical evaluation. Please ask the  nurses and  secretary at the date of your neurosurgical evaluation  F/U neurological evaluation. May consider PNCV/EMG studies and other studies pending follow-up evaluations  May consider radiofrequency rhizolysis or intraspinal procedures pending response to present treatment and F/U evaluation . We will avoid such treatment at this time  Patient to call [facility] should patient have concerns prior to scheduled return appointment.

## 2016-01-21 NOTE — Patient Instructions (Addendum)
PLAN  Continue present medications Norflex  oxycodone and methadone   Lumbar epidural steroid injection to be performed at time of return appointment  F/U PCP Dr. Kary Kos for evaluation of  BP diabetes mellitus with and general medical condition . We need medical clearance from Dr. Kary Kos or Dr.Solum to perform lumbar epidural steroid injection  Please go to the lab to have PT PTT and INR drawn on the morning of plan the lumbar epidural steroid injection  F/U Dr. Josefa Half. We need medical clearance from Dr.Parachos to hold anticoagulant therapy for lumbar epidural steroid injection   F/U surgical evaluation. Please ask the nurses and secretary at the date of your neurosurgical evaluation  F/U neurological evaluation. May consider PNCV/EMG studies and other studies pending follow-up evaluations  May consider radiofrequency rhizolysis or intraspinal procedures pending response to present treatment and F/U evaluation . We will avoid such treatment at this time  Patient to call [facility] should patient have concerns prior to scheduled return appointment.    GENERAL RISKS AND COMPLICATIONS  What are the risk, side effects and possible complications? Generally speaking, most procedures are safe.  However, with any procedure there are risks, side effects, and the possibility of complications.  The risks and complications are dependent upon the sites that are lesioned, or the type of nerve block to be performed.  The closer the procedure is to the spine, the more serious the risks are.  Great care is taken when placing the radio frequency needles, block needles or lesioning probes, but sometimes complications can occur. 1. Infection: Any time there is an injection through the skin, there is a risk of infection.  This is why sterile conditions are used for these blocks.  There are four possible types of infection. 1. Localized skin infection. 2. Central Nervous System Infection-This  can be in the form of Meningitis, which can be deadly. 3. Epidural Infections-This can be in the form of an epidural abscess, which can cause pressure inside of the spine, causing compression of the spinal cord with subsequent paralysis. This would require an emergency surgery to decompress, and there are no guarantees that the patient would recover from the paralysis. 4. Discitis-This is an infection of the intervertebral discs.  It occurs in about 1% of discography procedures.  It is difficult to treat and it may lead to surgery.        2. Pain: the needles have to go through skin and soft tissues, will cause soreness.       3. Damage to internal structures:  The nerves to be lesioned may be near blood vessels or    other nerves which can be potentially damaged.       4. Bleeding: Bleeding is more common if the patient is taking blood thinners such as  aspirin, Coumadin, Ticiid, Plavix, etc., or if he/she have some genetic predisposition  such as hemophilia. Bleeding into the spinal canal can cause compression of the spinal  cord with subsequent paralysis.  This would require an emergency surgery to  decompress and there are no guarantees that the patient would recover from the  paralysis.       5. Pneumothorax:  Puncturing of a lung is a possibility, every time a needle is introduced in  the area of the chest or upper back.  Pneumothorax refers to free air around the  collapsed lung(s), inside of the thoracic cavity (chest cavity).  Another two possible  complications related to a similar event would  include: Hemothorax and Chylothorax.   These are variations of the Pneumothorax, where instead of air around the collapsed  lung(s), you may have blood or chyle, respectively.       6. Spinal headaches: They may occur with any procedures in the area of the spine.       7. Persistent CSF (Cerebro-Spinal Fluid) leakage: This is a rare problem, but may occur  with prolonged intrathecal or epidural catheters  either due to the formation of a fistulous  track or a dural tear.       8. Nerve damage: By working so close to the spinal cord, there is always a possibility of  nerve damage, which could be as serious as a permanent spinal cord injury with  paralysis.       9. Death:  Although rare, severe deadly allergic reactions known as "Anaphylactic  reaction" can occur to any of the medications used.      10. Worsening of the symptoms:  We can always make thing worse.  What are the chances of something like this happening? Chances of any of this occuring are extremely low.  By statistics, you have more of a chance of getting killed in a motor vehicle accident: while driving to the hospital than any of the above occurring .  Nevertheless, you should be aware that they are possibilities.  In general, it is similar to taking a shower.  Everybody knows that you can slip, hit your head and get killed.  Does that mean that you should not shower again?  Nevertheless always keep in mind that statistics do not mean anything if you happen to be on the wrong side of them.  Even if a procedure has a 1 (one) in a 1,000,000 (million) chance of going wrong, it you happen to be that one..Also, keep in mind that by statistics, you have more of a chance of having something go wrong when taking medications.  Who should not have this procedure? If you are on a blood thinning medication (e.g. Coumadin, Plavix, see list of "Blood Thinners"), or if you have an active infection going on, you should not have the procedure.  If you are taking any blood thinners, please inform your physician.  How should I prepare for this procedure?  Do not eat or drink anything at least six hours prior to the procedure.  Bring a driver with you .  It cannot be a taxi.  Come accompanied by an adult that can drive you back, and that is strong enough to help you if your legs get weak or numb from the local anesthetic.  Take all of your medicines the  morning of the procedure with just enough water to swallow them.  If you have diabetes, make sure that you are scheduled to have your procedure done first thing in the morning, whenever possible.  If you have diabetes, take only half of your insulin dose and notify our nurse that you have done so as soon as you arrive at the clinic.  If you are diabetic, but only take blood sugar pills (oral hypoglycemic), then do not take them on the morning of your procedure.  You may take them after you have had the procedure.  Do not take aspirin or any aspirin-containing medications, at least eleven (11) days prior to the procedure.  They may prolong bleeding.  Wear loose fitting clothing that may be easy to take off and that you would not mind if it got  stained with Betadine or blood.  Do not wear any jewelry or perfume  Remove any nail coloring.  It will interfere with some of our monitoring equipment.  NOTE: Remember that this is not meant to be interpreted as a complete list of all possible complications.  Unforeseen problems may occur.  BLOOD THINNERS The following drugs contain aspirin or other products, which can cause increased bleeding during surgery and should not be taken for 2 weeks prior to and 1 week after surgery.  If you should need take something for relief of minor pain, you may take acetaminophen which is found in Tylenol,m Datril, Anacin-3 and Panadol. It is not blood thinner. The products listed below are.  Do not take any of the products listed below in addition to any listed on your instruction sheet.  A.P.C or A.P.C with Codeine Codeine Phosphate Capsules #3 Ibuprofen Ridaura  ABC compound Congesprin Imuran rimadil  Advil Cope Indocin Robaxisal  Alka-Seltzer Effervescent Pain Reliever and Antacid Coricidin or Coricidin-D  Indomethacin Rufen  Alka-Seltzer plus Cold Medicine Cosprin Ketoprofen S-A-C Tablets  Anacin Analgesic Tablets or Capsules Coumadin Korlgesic Salflex  Anacin  Extra Strength Analgesic tablets or capsules CP-2 Tablets Lanoril Salicylate  Anaprox Cuprimine Capsules Levenox Salocol  Anexsia-D Dalteparin Magan Salsalate  Anodynos Darvon compound Magnesium Salicylate Sine-off  Ansaid Dasin Capsules Magsal Sodium Salicylate  Anturane Depen Capsules Marnal Soma  APF Arthritis pain formula Dewitt's Pills Measurin Stanback  Argesic Dia-Gesic Meclofenamic Sulfinpyrazone  Arthritis Bayer Timed Release Aspirin Diclofenac Meclomen Sulindac  Arthritis pain formula Anacin Dicumarol Medipren Supac  Analgesic (Safety coated) Arthralgen Diffunasal Mefanamic Suprofen  Arthritis Strength Bufferin Dihydrocodeine Mepro Compound Suprol  Arthropan liquid Dopirydamole Methcarbomol with Aspirin Synalgos  ASA tablets/Enseals Disalcid Micrainin Tagament  Ascriptin Doan's Midol Talwin  Ascriptin A/D Dolene Mobidin Tanderil  Ascriptin Extra Strength Dolobid Moblgesic Ticlid  Ascriptin with Codeine Doloprin or Doloprin with Codeine Momentum Tolectin  Asperbuf Duoprin Mono-gesic Trendar  Aspergum Duradyne Motrin or Motrin IB Triminicin  Aspirin plain, buffered or enteric coated Durasal Myochrisine Trigesic  Aspirin Suppositories Easprin Nalfon Trillsate  Aspirin with Codeine Ecotrin Regular or Extra Strength Naprosyn Uracel  Atromid-S Efficin Naproxen Ursinus  Auranofin Capsules Elmiron Neocylate Vanquish  Axotal Emagrin Norgesic Verin  Azathioprine Empirin or Empirin with Codeine Normiflo Vitamin E  Azolid Emprazil Nuprin Voltaren  Bayer Aspirin plain, buffered or children's or timed BC Tablets or powders Encaprin Orgaran Warfarin Sodium  Buff-a-Comp Enoxaparin Orudis Zorpin  Buff-a-Comp with Codeine Equegesic Os-Cal-Gesic   Buffaprin Excedrin plain, buffered or Extra Strength Oxalid   Bufferin Arthritis Strength Feldene Oxphenbutazone   Bufferin plain or Extra Strength Feldene Capsules Oxycodone with Aspirin   Bufferin with Codeine Fenoprofen Fenoprofen Pabalate or  Pabalate-SF   Buffets II Flogesic Panagesic   Buffinol plain or Extra Strength Florinal or Florinal with Codeine Panwarfarin   Buf-Tabs Flurbiprofen Penicillamine   Butalbital Compound Four-way cold tablets Penicillin   Butazolidin Fragmin Pepto-Bismol   Carbenicillin Geminisyn Percodan   Carna Arthritis Reliever Geopen Persantine   Carprofen Gold's salt Persistin   Chloramphenicol Goody's Phenylbutazone   Chloromycetin Haltrain Piroxlcam   Clmetidine heparin Plaquenil   Cllnoril Hyco-pap Ponstel   Clofibrate Hydroxy chloroquine Propoxyphen         Before stopping any of these medications, be sure to consult the physician who ordered them.  Some, such as Coumadin (Warfarin) are ordered to prevent or treat serious conditions such as "deep thrombosis", "pumonary embolisms", and other heart problems.  The amount of time that  you may need off of the medication may also vary with the medication and the reason for which you were taking it.  If you are taking any of these medications, please make sure you notify your pain physician before you undergo any procedures.  Epidural Steroid Injection Patient Information  Description: The epidural space surrounds the nerves as they exit the spinal cord.  In some patients, the nerves can be compressed and inflamed by a bulging disc or a tight spinal canal (spinal stenosis).  By injecting steroids into the epidural space, we can bring irritated nerves into direct contact with a potentially helpful medication.  These steroids act directly on the irritated nerves and can reduce swelling and inflammation which often leads to decreased pain.  Epidural steroids may be injected anywhere along the spine and from the neck to the low back depending upon the location of your pain.   After numbing the skin with local anesthetic (like Novocaine), a small needle is passed into the epidural space slowly.  You may experience a sensation of pressure while this is being done.   The entire block usually last less than 10 minutes.  Conditions which may be treated by epidural steroids:   Low back and leg pain  Neck and arm pain  Spinal stenosis  Post-laminectomy syndrome  Herpes zoster (shingles) pain  Pain from compression fractures  Preparation for the injection:  1. Do not eat any solid food or dairy products within 8 hours of your appointment.  2. You may drink clear liquids up to 3 hours before appointment.  Clear liquids include water, black coffee, juice or soda.  No milk or cream please. 3. You may take your regular medication, including pain medications, with a sip of water before your appointment  Diabetics should hold regular insulin (if taken separately) and take 1/2 normal NPH dos the morning of the procedure.  Carry some sugar containing items with you to your appointment. 4. A driver must accompany you and be prepared to drive you home after your procedure.  5. Bring all your current medications with your. 6. An IV may be inserted and sedation may be given at the discretion of the physician.   7. A blood pressure cuff, EKG and other monitors will often be applied during the procedure.  Some patients may need to have extra oxygen administered for a short period. 8. You will be asked to provide medical information, including your allergies, prior to the procedure.  We must know immediately if you are taking blood thinners (like Coumadin/Warfarin)  Or if you are allergic to IV iodine contrast (dye). We must know if you could possible be pregnant.  Possible side-effects:  Bleeding from needle site  Infection (rare, may require surgery)  Nerve injury (rare)  Numbness & tingling (temporary)  Difficulty urinating (rare, temporary)  Spinal headache ( a headache worse with upright posture)  Light -headedness (temporary)  Pain at injection site (several days)  Decreased blood pressure (temporary)  Weakness in arm/leg (temporary)  Pressure  sensation in back/neck (temporary)  Call if you experience:  Fever/chills associated with headache or increased back/neck pain.  Headache worsened by an upright position.  New onset weakness or numbness of an extremity below the injection site  Hives or difficulty breathing (go to the emergency room)  Inflammation or drainage at the infection site  Severe back/neck pain  Any new symptoms which are concerning to you  Please note:  Although the local anesthetic injected can often  make your back or neck feel good for several hours after the injection, the pain will likely return.  It takes 3-7 days for steroids to work in the epidural space.  You may not notice any pain relief for at least that one week.  If effective, we will often do a series of three injections spaced 3-6 weeks apart to maximally decrease your pain.  After the initial series, we generally will wait several months before considering a repeat injection of the same type.  If you have any questions, please call [phone] New Site not take your aspirin or effient for 5 days prior to your procedure.

## 2016-01-22 ENCOUNTER — Telehealth: Payer: Self-pay | Admitting: *Deleted

## 2016-01-22 NOTE — Telephone Encounter (Signed)
Patient did not have procedure on yesterday d/t taking blood thinners.

## 2016-01-24 ENCOUNTER — Telehealth: Payer: Self-pay | Admitting: Pain Medicine

## 2016-01-24 NOTE — Telephone Encounter (Signed)
Patient has appt on 7-18 for med refill and 7-19 for procedure, he would like to just come in on 7-19 and do both, please call and let him know if this is possible

## 2016-01-24 NOTE — Telephone Encounter (Signed)
Todd Banks and Nurses Please call and inform patient that procedure and medication refill can both be accomplished on  Wednesday, 01/30/2016. Thank you Also please remind patient that he should go to the lab to have PT, PTT, and INR drawn on the morning of his planned lumbar epidural steroid injection which is Wednesday, 01/30/2016.  Thank you

## 2016-01-29 ENCOUNTER — Ambulatory Visit: Payer: Medicare Other | Admitting: Pain Medicine

## 2016-01-30 ENCOUNTER — Other Ambulatory Visit
Admission: RE | Admit: 2016-01-30 | Discharge: 2016-01-30 | Disposition: A | Discharge status: Home / Self Care | Payer: 59 | Source: Ambulatory Visit | Attending: Pain Medicine | Admitting: Pain Medicine

## 2016-01-30 ENCOUNTER — Ambulatory Visit
Admission: RE | Admit: 2016-01-30 | Discharge: 2016-01-30 | Disposition: A | Discharge status: Home / Self Care | Payer: 59 | Source: Ambulatory Visit | Attending: Pain Medicine | Admitting: Pain Medicine

## 2016-01-30 ENCOUNTER — Ambulatory Visit: Payer: 59 | Admitting: Pain Medicine

## 2016-01-30 ENCOUNTER — Encounter: Payer: Self-pay | Admitting: Pain Medicine

## 2016-01-30 VITALS — BP 117/65 | HR 68 | Temp 97.4°F | Resp 17 | Ht 69.0 in | Wt 210.0 lb

## 2016-01-30 DIAGNOSIS — M5137 Other intervertebral disc degeneration, lumbosacral region: Secondary | ICD-10-CM | POA: Insufficient documentation

## 2016-01-30 DIAGNOSIS — M150 Primary generalized (osteo)arthritis: Secondary | ICD-10-CM

## 2016-01-30 DIAGNOSIS — W1830XA Fall on same level, unspecified, initial encounter: Secondary | ICD-10-CM | POA: Insufficient documentation

## 2016-01-30 DIAGNOSIS — M160 Bilateral primary osteoarthritis of hip: Secondary | ICD-10-CM | POA: Insufficient documentation

## 2016-01-30 DIAGNOSIS — S0093XA Contusion of unspecified part of head, initial encounter: Secondary | ICD-10-CM | POA: Diagnosis not present

## 2016-01-30 DIAGNOSIS — M159 Polyosteoarthritis, unspecified: Secondary | ICD-10-CM

## 2016-01-30 DIAGNOSIS — E1140 Type 2 diabetes mellitus with diabetic neuropathy, unspecified: Secondary | ICD-10-CM | POA: Insufficient documentation

## 2016-01-30 DIAGNOSIS — Z7901 Long term (current) use of anticoagulants: Secondary | ICD-10-CM

## 2016-01-30 DIAGNOSIS — G319 Degenerative disease of nervous system, unspecified: Secondary | ICD-10-CM | POA: Diagnosis not present

## 2016-01-30 DIAGNOSIS — E1340 Other specified diabetes mellitus with diabetic neuropathy, unspecified: Secondary | ICD-10-CM | POA: Diagnosis not present

## 2016-01-30 DIAGNOSIS — M961 Postlaminectomy syndrome, not elsewhere classified: Secondary | ICD-10-CM | POA: Insufficient documentation

## 2016-01-30 LAB — PROTIME-INR
INR: 0.96
PROTHROMBIN TIME: 13 s (ref 11.4–15.0)

## 2016-01-30 LAB — APTT: aPTT: 29 seconds (ref 24–36)

## 2016-01-30 MED ORDER — FENTANYL CITRATE (PF) 100 MCG/2ML IJ SOLN
INTRAMUSCULAR | Status: AC
Start: 2016-01-30 — End: 2016-01-30
  Administered 2016-01-30: 50 ug
  Filled 2016-01-30: qty 2

## 2016-01-30 MED ORDER — TRIAMCINOLONE ACETONIDE 40 MG/ML IJ SUSP
INTRAMUSCULAR | Status: AC
  Administered 2016-01-30: 13:00:00
  Filled 2016-01-30: qty 1

## 2016-01-30 MED ORDER — BUPIVACAINE HCL (PF) 0.25 % IJ SOLN
INTRAMUSCULAR | Status: AC
  Administered 2016-01-30: 13:00:00
  Filled 2016-01-30: qty 30

## 2016-01-30 MED ORDER — AZITHROMYCIN 250 MG PO TABS: ORAL_TABLET | ORAL | Status: DC

## 2016-01-30 MED ORDER — MIDAZOLAM HCL 5 MG/5ML IJ SOLN
INTRAMUSCULAR | Status: AC
  Administered 2016-01-30: 3 mg via INTRAVENOUS
  Filled 2016-01-30: qty 5

## 2016-01-30 MED ORDER — SODIUM CHLORIDE 0.9 % IJ SOLN
INTRAMUSCULAR | Status: AC
  Administered 2016-01-30: 13:00:00
  Filled 2016-01-30: qty 10

## 2016-01-30 MED ORDER — ORPHENADRINE CITRATE 30 MG/ML IJ SOLN
INTRAMUSCULAR | Status: AC
  Administered 2016-01-30: 13:00:00
  Filled 2016-01-30: qty 2

## 2016-01-30 NOTE — Progress Notes (Signed)
Subjective:    Patient ID: Todd Banks, male    DOB: Aug 22, 1950, 65 y.o.   MRN: [id number]  HPI  PROCEDURE PERFORMED: Lumbar epidural steroid injection   NOTE: The patient is a 65 y.o. male who returns to [place] for further evaluation and treatment of pain involving the lumbar and lower extremity region. MRI revealed the patient to be with degenerative disc disease lumbar spine 1. Stable findings at L5-S1 status post laminectomy and interbody fusion. There is no residual spinal stenosis or nerve root encroachment. 2. Stable mild spinal stenosis and mild narrowing of the lateral recesses at L4-5 status post partial posterior decompression. No nerve root encroachment. 3. Mildly progressive disc bulging at L3-4 with resulting mild spinal stenosis and mild narrowing of the lateral recesses. 4. No significant foraminal narrowing or definite nerve root Encroachment... There is concern regarding intraspinal abnormalities contributing to patient's symptomatology. The patient also is with history of diabetes mellitus with diabetic neuropathy and with history of shingles involving the left lower extremity which can also be contributing to some of patient's lumbar lower extremity pain paresthesias The risks, benefits, and expectations of the procedure have been discussed and explained to the patient who was understanding and in agreement with suggested treatment plan. We will proceed with lumbar epidural steroid injection as discussed and as explained to the patient who is willing to proceed with procedure as planned.   DESCRIPTION OF PROCEDURE: Lumbar epidural steroid injection with IV Versed, IV fentanyl conscious sedation, EKG, blood pressure, pulse, capnography, and pulse oximetry monitoring. The procedure was performed with the patient in the prone position under fluoroscopic guidance. A local anesthetic skin wheal of 1.5% plain lidocaine was accomplished at proposed entry site. An  18-gauge Tuohy epidural needle was inserted at the L 4 vertebral body level left of the midline via loss-of-resistance technique with negative heme and negative CSF return. A total of 4 mL of Preservative-Free normal saline with 40 mg of Kenalog injected incrementally via epidurally placed needle. Needle was removed.  Myoneural block injections of the gluteal musculature region Following Betadine prep of proposed entry site a 22-gauge needle was inserted into the gluteal musculature region and following negative aspiration 2 cc of 0.25% bupivacaine with Norflex was injected for myoneural block injection of the gluteal musculature region times two.  A total of 40 mg of Kenalog was utilized for the procedure.   The patient tolerated the injection well.    PLAN:   1. Medications: We will continue presently prescribed medications. Norflex oxycodone and methadone and will decrease the doses of these medications until further assessment of patient's neurological status status post trauma to the head including evaluation of CT scan 2. Will consider modification of treatment regimen pending response to treatment rendered on today's visit and follow-up evaluation. 3. The patient is to follow-up with primary care physician Dr. Bertrum Sol and Dr.Parachos  regarding blood pressure, recent trauma to head, diabetes mellitus and general medical condition status post lumbar epidural steroid injection performed on today's visit.       CT scan of head to evaluate for hematoma and other abnormalities ordered today 4. Surgical evaluation. Has been addressed 5. Neurological evaluation. Has been addressed. We have discussed further neurological evaluation of patient status post trauma to the head 6. The patient may be a candidate for radiofrequency procedures, implantation device, and other treatment pending response to treatment and follow-up evaluation. 7. The patient has been advised to adhere to proper body mechanics  and avoid activities which appear to aggravate condition. 8. The patient has been advised to call the [facility] prior to scheduled return appointment should there be significant change in condition or should there be sign  The patient is understanding and agrees with the suggested  treatment plan   Review of Systems     Objective:   Physical Exam        Assessment & Plan:

## 2016-01-30 NOTE — Patient Instructions (Addendum)
PLAN  Continue present medications Norflex  oxycodone and methadone and decrease the doses of all medications by half at this time until we get results of CT scan of the he . Please begin azithromycin (Z-Pak) antibiotic today as prescribed  F/U PCP Dr. Kary Kos for evaluation of  BP trauma to head status post fall diabetes mellitus with and general medical condition .  F/U Dr. Josefa Half.  F/U surgical evaluation. Please ask the nurses and secretary at the date of your neurosurgical evaluation  F/U neurological evaluation. May consider PNCV/EMG studies and other studies pending follow-up evaluations  Please get CT scan of head today for evaluation status post fall  May consider radiofrequency rhizolysis or intraspinal procedures pending response to present treatment and F/U evaluation . We will avoid such treatment at this time  Patient to call [facility] Center should patient have concerns prior to scheduled return appointment.  Epidural Steroid Injection Patient Information  Description: The epidural space surrounds the nerves as they exit the spinal cord.  In some patients, the nerves can be compressed and inflamed by a bulging disc or a tight spinal canal (spinal stenosis).  By injecting steroids into the epidural space, we can bring irritated nerves into direct contact with a potentially helpful medication.  These steroids act directly on the irritated nerves and can reduce swelling and inflammation which often leads to decreased pain.  Epidural steroids may be injected anywhere along the spine and from the neck to the low back depending upon the location of your pain.   After numbing the skin with local anesthetic (like Novocaine), a small needle is passed into the epidural space slowly.  You may experience a sensation of pressure while this is being done.  The entire block usually last less than 10 minutes.  Conditions which may be treated by epidural steroids:   Low back and leg  pain  Neck and arm pain  Spinal stenosis  Post-laminectomy syndrome  Herpes zoster (shingles) pain  Pain from compression fractures  Preparation for the injection:  1. Do not eat any solid food or dairy products within 8 hours of your appointment.  2. You may drink clear liquids up to 3 hours before appointment.  Clear liquids include water, black coffee, juice or soda.  No milk or cream please. 3. You may take your regular medication, including pain medications, with a sip of water before your appointment  Diabetics should hold regular insulin (if taken separately) and take 1/2 normal NPH dos the morning of the procedure.  Carry some sugar containing items with you to your appointment. 4. A driver must accompany you and be prepared to drive you home after your procedure.  5. Bring all your current medications with your. 6. An IV may be inserted and sedation may be given at the discretion of the physician.   7. A blood pressure cuff, EKG and other monitors will often be applied during the procedure.  Some patients may need to have extra oxygen administered for a short period. 8. You will be asked to provide medical information, including your allergies, prior to the procedure.  We must know immediately if you are taking blood thinners (like Coumadin/Warfarin)  Or if you are allergic to IV iodine contrast (dye). We must know if you could possible be pregnant.  Possible side-effects:  Bleeding from needle site  Infection (rare, may require surgery)  Nerve injury (rare)  Numbness & tingling (temporary)  Difficulty urinating (rare, temporary)  Spinal headache (  a headache worse with upright posture)  Light -headedness (temporary)  Pain at injection site (several days)  Decreased blood pressure (temporary)  Weakness in arm/leg (temporary)  Pressure sensation in back/neck (temporary)  Call if you experience:  Fever/chills associated with headache or increased back/neck  pain.  Headache worsened by an upright position.  New onset weakness or numbness of an extremity below the injection site  Hives or difficulty breathing (go to the emergency room)  Inflammation or drainage at the infection site  Severe back/neck pain  Any new symptoms which are concerning to you  Please note:  Although the local anesthetic injected can often make your back or neck feel good for several hours after the injection, the pain will likely return.  It takes 3-7 days for steroids to work in the epidural space.  You may not notice any pain relief for at least that one week.  If effective, we will often do a series of three injections spaced 3-6 weeks apart to maximally decrease your pain.  After the initial series, we generally will wait several months before considering a repeat injection of the same type.  If you have any questions, please call [phone]  Be careful moving about. Muscle spasms in the area of the injection may occur.  Use ice for the next 24 hours (15 minutes on, 15 minutes off).  After 24 hours, you may use heat for comfort if you wish.  Post procedure numbness or redness is expected, average 4-6 hours.  If numbness develops after 4-6 hours and is felt to be progressing and worsening, immediately contact your physician.

## 2016-01-30 NOTE — Progress Notes (Signed)
Safety precautions to be maintained throughout the outpatient stay will include: orient to surroundings, keep bed in low position, maintain call bell within reach at all times, provide assistance with transfer out of bed and ambulation.  

## 2016-01-31 ENCOUNTER — Telehealth: Payer: Self-pay | Admitting: *Deleted

## 2016-01-31 NOTE — Telephone Encounter (Signed)
Spoke with patient to let him know of his CT scan findings. Patient verbalizes u/o information given.

## 2016-01-31 NOTE — Telephone Encounter (Signed)
No problems post procedure. 

## 2016-02-04 ENCOUNTER — Ambulatory Visit: Payer: 59 | Attending: Pain Medicine | Admitting: Pain Medicine

## 2016-02-04 ENCOUNTER — Encounter: Payer: Self-pay | Admitting: Pain Medicine

## 2016-02-04 VITALS — BP 145/86 | HR 98 | Temp 98.4°F | Resp 16 | Ht 69.0 in | Wt 210.0 lb

## 2016-02-04 DIAGNOSIS — Z981 Arthrodesis status: Secondary | ICD-10-CM | POA: Insufficient documentation

## 2016-02-04 DIAGNOSIS — M79606 Pain in leg, unspecified: Secondary | ICD-10-CM | POA: Diagnosis present

## 2016-02-04 DIAGNOSIS — M545 Low back pain: Secondary | ICD-10-CM | POA: Diagnosis present

## 2016-02-04 DIAGNOSIS — M4806 Spinal stenosis, lumbar region: Secondary | ICD-10-CM | POA: Diagnosis not present

## 2016-02-04 DIAGNOSIS — Z9889 Other specified postprocedural states: Secondary | ICD-10-CM | POA: Insufficient documentation

## 2016-02-04 DIAGNOSIS — E1140 Type 2 diabetes mellitus with diabetic neuropathy, unspecified: Secondary | ICD-10-CM | POA: Insufficient documentation

## 2016-02-04 DIAGNOSIS — M159 Polyosteoarthritis, unspecified: Secondary | ICD-10-CM

## 2016-02-04 DIAGNOSIS — M19019 Primary osteoarthritis, unspecified shoulder: Secondary | ICD-10-CM | POA: Diagnosis not present

## 2016-02-04 DIAGNOSIS — B0229 Other postherpetic nervous system involvement: Secondary | ICD-10-CM | POA: Insufficient documentation

## 2016-02-04 DIAGNOSIS — M5136 Other intervertebral disc degeneration, lumbar region: Secondary | ICD-10-CM | POA: Diagnosis not present

## 2016-02-04 DIAGNOSIS — E1340 Other specified diabetes mellitus with diabetic neuropathy, unspecified: Secondary | ICD-10-CM

## 2016-02-04 DIAGNOSIS — M5126 Other intervertebral disc displacement, lumbar region: Secondary | ICD-10-CM | POA: Diagnosis not present

## 2016-02-04 DIAGNOSIS — M961 Postlaminectomy syndrome, not elsewhere classified: Secondary | ICD-10-CM

## 2016-02-04 DIAGNOSIS — M5137 Other intervertebral disc degeneration, lumbosacral region: Secondary | ICD-10-CM

## 2016-02-04 DIAGNOSIS — M150 Primary generalized (osteo)arthritis: Secondary | ICD-10-CM

## 2016-02-04 MED ORDER — OXYCODONE HCL 5 MG PO CAPS: ORAL_CAPSULE | ORAL | 0 refills | Status: DC

## 2016-02-04 MED ORDER — METHADONE HCL 10 MG PO TABS: ORAL_TABLET | ORAL | 0 refills | Status: DC

## 2016-02-04 NOTE — Patient Instructions (Signed)
PLAN  Continue present medications Norflex  oxycodone and methadone  F/U PCP Dr. Kary Kos for evaluation of  BP diabetes mellitus with and general medical  condition  F/U Dr. Josefa Half  F/U surgical evaluation. Please ask the nurses and secretary at the date of your neurosurgical evaluation  F/U neurological evaluation. May consider PNCV/EMG studies and other studies pending follow-up evaluations  May consider radiofrequency rhizolysis or intraspinal procedures pending response to present treatment and F/U evaluation . We will avoid such treatment at this time  Patient to call [facility] should patient have concerns prior to scheduled return appointment.

## 2016-02-04 NOTE — Progress Notes (Signed)
Patient here post procedure and for medication management. Safety precautions to be maintained throughout the outpatient stay will include: orient to surroundings, keep bed in low position, maintain call bell within reach at all times, provide assistance with transfer out of bed and ambulation.

## 2016-02-04 NOTE — Progress Notes (Signed)
    The patient is a 65 year old gentleman who returns to pain management for further evaluation and treatment of pain involving the lower back and lower extremity region with as well as the shoulder. The patient has some improvement of his pain involving the lower back lower extremity region status post lumbar epidural steroid injection. We discussed patient's condition and we will continue to observe patient's response to present treatment. We will also discussed patient undergoing surgical evaluation. At the present time patient will continue medications consisting of Norflex oxycodone and methadone. We will remain available to consider patient for additional interventional treatment as well as modifications of occasions and other aspects of treatment regimen pending follow-up evaluation in pending neurosurgical reevaluation as discussed and as explained to patient on today's visit. All agreed with suggested treatment plan.    Physical examination  There was tenderness of the splenius capitis and occipitalis region palpation which reproduces minimal discomfort. There was minimal tenderness of the acromioclavicular and glenohumeral joint region and patient appeared to be with bilaterally equal grip strength without increase of pain with Tinel and Phalen's maneuver. There was unremarkable Spurling's maneuver. Palpation over the thoracic region was without crepitus of the thoracic region and there was tenderness to palpation of the lower thoracic paraspinal musculature region a moderate degree. Palpation over the lumbar region was with moderate discomfort. Lateral bending rotation extension and palpation of the lumbar facets reproduce moderate discomfort. Straight leg raise was tolerates approximately 20 with questionably increased pain with dorsiflexion noted. EHL strength appeared to be decreased. There was questionably decreased sensation of the L5 dermatomal distribution. There appeared to be negative  clonus negative Homans. Patient difficulty attempted to stand on tiptoes and heels. Palpation over the PSIS and PII S region reproduced mild to moderate discomfort. With minimal tenderness of the greater trochanteric region iliotibial band region. There was negative clonus negative Homans. Abdomen was nontender with no costovertebral tenderness noted     Assessment   Degenerative disc disease lumbar spine 1. Stable findings at L5-S1 status post laminectomy and interbody fusion. There is no residual spinal stenosis or nerve root encroachment. 2. Stable mild spinal stenosis and mild narrowing of the lateral recesses at L4-5 status post partial posterior decompression. No nerve root encroachment. 3. Mildly progressive disc bulging at L3-4 with resulting mild spinal stenosis and mild narrowing of the lateral recesses. 4. No significant foraminal narrowing or definite nerve root Encroachment.  Lumbar stenosis with neurogenic claudication  Lumbar facet syndrome  Postherpetic neuralgia of lumbar lower extremity region on the left  Degenerative joint disease of shoulder  Diabetic neuropathy       PLAN  Continue present medications Norflex  oxycodone and methadone  F/U PCP Dr. Kary Kos for evaluation of  BP diabetes mellitus with and general medical  condition  F/U Dr. Josefa Half  F/U surgical evaluation. Please ask the nurses and secretary at the date of your neurosurgical evaluation  F/U neurological evaluation. May consider PNCV/EMG studies and other studies pending follow-up evaluations  May consider radiofrequency rhizolysis or intraspinal procedures pending response to present treatment and F/U evaluation . We will avoid such treatment at this time  Patient to call [facility] Management Center should patient have concerns prior to scheduled return appointment.

## 2016-02-06 ENCOUNTER — Ambulatory Visit: Payer: Medicare Other | Admitting: Pain Medicine

## 2016-02-26 ENCOUNTER — Ambulatory Visit: Payer: Medicare Other | Admitting: Pain Medicine

## 2016-03-04 ENCOUNTER — Ambulatory Visit: Payer: 59 | Attending: Pain Medicine | Admitting: Pain Medicine

## 2016-03-04 ENCOUNTER — Encounter: Payer: Self-pay | Admitting: Pain Medicine

## 2016-03-04 VITALS — BP 113/46 | HR 71 | Temp 98.2°F | Resp 16 | Ht 69.0 in | Wt 205.0 lb

## 2016-03-04 DIAGNOSIS — M4806 Spinal stenosis, lumbar region: Secondary | ICD-10-CM | POA: Insufficient documentation

## 2016-03-04 DIAGNOSIS — E1340 Other specified diabetes mellitus with diabetic neuropathy, unspecified: Secondary | ICD-10-CM

## 2016-03-04 DIAGNOSIS — E1140 Type 2 diabetes mellitus with diabetic neuropathy, unspecified: Secondary | ICD-10-CM | POA: Insufficient documentation

## 2016-03-04 DIAGNOSIS — B0229 Other postherpetic nervous system involvement: Secondary | ICD-10-CM

## 2016-03-04 DIAGNOSIS — M159 Polyosteoarthritis, unspecified: Secondary | ICD-10-CM

## 2016-03-04 DIAGNOSIS — M545 Low back pain: Secondary | ICD-10-CM | POA: Diagnosis present

## 2016-03-04 DIAGNOSIS — M5126 Other intervertebral disc displacement, lumbar region: Secondary | ICD-10-CM | POA: Diagnosis not present

## 2016-03-04 DIAGNOSIS — M48062 Spinal stenosis, lumbar region with neurogenic claudication: Secondary | ICD-10-CM

## 2016-03-04 DIAGNOSIS — M79604 Pain in right leg: Secondary | ICD-10-CM | POA: Diagnosis present

## 2016-03-04 DIAGNOSIS — Z9889 Other specified postprocedural states: Secondary | ICD-10-CM | POA: Diagnosis not present

## 2016-03-04 DIAGNOSIS — M5137 Other intervertebral disc degeneration, lumbosacral region: Secondary | ICD-10-CM

## 2016-03-04 DIAGNOSIS — M961 Postlaminectomy syndrome, not elsewhere classified: Secondary | ICD-10-CM

## 2016-03-04 DIAGNOSIS — Z981 Arthrodesis status: Secondary | ICD-10-CM | POA: Diagnosis not present

## 2016-03-04 DIAGNOSIS — M5116 Intervertebral disc disorders with radiculopathy, lumbar region: Secondary | ICD-10-CM | POA: Insufficient documentation

## 2016-03-04 DIAGNOSIS — M19019 Primary osteoarthritis, unspecified shoulder: Secondary | ICD-10-CM | POA: Diagnosis not present

## 2016-03-04 DIAGNOSIS — M150 Primary generalized (osteo)arthritis: Secondary | ICD-10-CM

## 2016-03-04 DIAGNOSIS — M79605 Pain in left leg: Secondary | ICD-10-CM | POA: Diagnosis present

## 2016-03-04 MED ORDER — ORPHENADRINE CITRATE ER 100 MG PO TB12: ORAL_TABLET | ORAL | 2 refills | Status: DC

## 2016-03-04 MED ORDER — OXYCODONE HCL 5 MG PO CAPS: ORAL_CAPSULE | ORAL | 0 refills | Status: DC

## 2016-03-04 MED ORDER — METHADONE HCL 10 MG PO TABS: ORAL_TABLET | ORAL | 0 refills | Status: DC

## 2016-03-04 NOTE — Progress Notes (Signed)
The patient is a 65 year old general returns to pain management for further evaluation and treatment of pain involving the mid lower back and lower extremity regions predominantly. The patient is with prior surgical intervention of the lumbar region with prior surgical evaluation with concern regarding prior surgery of the lumbar region and with surgeon deciding to avoid interventional treatment palpation at that time. At the present time patient is with some improvement of his pain following lumbar epidural steroid injection. We discuss neurosurgical reevaluation of patient's condition and will schedule patient for neurosurgical reevaluation. We will also discussed patient's general medical condition including patient's diabetes mellitus and cardiac condition and informed patient that we wish to obtain approval from his 10 prior to considering second lumbar epidural steroid injection. The patient was understanding and agreement suggested treatment plan. The patient will proceed with surgical reevaluation of his lumbar lower extremity pain and we will remain available to consider a second lumbar epidural steroid injection pending medical clearance for patient's primary care physician cardiologists an endocrinologist. All were understanding and agreed to suggested treatment plan.     Physical examination  There was tenderness to palpation of the paraspinal musculatures and the cervical region cervical facet region palpation which reproduces mild discomfort with mild tenderness of the splenius capitis and occipitalis region. There was mild tenderness to palpation over the splenius capitis and occipitalis regions. There was mild tenderness of the acromioclavicular and glenohumeral joint region. Palpation over the thoracic paraspinal musculature region and thoracic facet region was without evidence of crepitus of the thoracic region noted. Palpation over the lumbar paraspinal musculatures and  lumbar facet region was associated with moderate to moderately severe discomfort with lateral bending rotation extension and palpation of the lumbar facets reproducing moderate to moderately severe discomfort. There was tenderness of the PSIS and PII S region a moderate degree. Straight leg raise was tolerates approximately 20. There was questionably decreased sensation of the L5 dermatomal distribution. There appeared to be decreased EHL strength. The left lower extremity appeared to be with more significantly decreased EHL strength in the right lower extremity. There was tenderness over the region of the greater trochanteric region iliotibial band region a mild degree. There appeared to be negative clonus negative Homans. Abdomen was nontender with no costovertebral angle tenderness noted      Assessment    Degenerative disc disease lumbar spine 1. Stable findings at L5-S1 status post laminectomy and interbody fusion. There is no residual spinal stenosis or nerve root encroachment. 2. Stable mild spinal stenosis and mild narrowing of the lateral recesses at L4-5 status post partial posterior decompression. No nerve root encroachment. 3. Mildly progressive disc bulging at L3-4 with resulting mild spinal stenosis and mild narrowing of the lateral recesses. 4. No significant foraminal narrowing or definite nerve root Encroachment.  Lumbar stenosis with neurogenic claudication  Lumbar radiculopathy  Lumbar facet syndrome  Postherpetic neuralgia of lumbar lower extremity region on the left  Degenerative joint disease of shoulder  Diabetic neuropathy      PLAN  Continue present medications Norflex  oxycodone and methadone  We will proceed with second lumbar epidural steroid injection if we have medical clearance from Dr. Kary Kos, Dr. Josefa Half, and Dr. Gabriel Carina  F/U PCP Dr. Kary Kos for evaluation of  BP diabetes mellitus with and general medical  Condition  F/U Dr.  Gabriel Carina  F/U Dr. Josefa Half  F/U surgical evaluation. Please ask the nurses and secretary at the date of your neurosurgical evaluation  F/U neurological evaluation.  May consider PNCV/EMG studies and other studies pending follow-up evaluations  May consider radiofrequency rhizolysis or intraspinal procedures pending response to present treatment and F/U evaluation . We will avoid such treatment at this time  Patient to call [facility] should patient have concerns prior to scheduled return appointment.

## 2016-03-04 NOTE — Patient Instructions (Addendum)
PLAN  Continue present medications Norflex  oxycodone and methadone  We will proceed with second lumbar epidural steroid injection if we have medical clearance from Dr. Kary Kos, Dr. Josefa Half, and Dr. Gabriel Carina  F/U PCP Dr. Kary Kos for evaluation of  BP diabetes mellitus with and general medical  condition  F/U Dr. Josefa Half  F/U surgical evaluation. Please ask the nurses and secretary at the date of your neurosurgical evaluation  F/U neurological evaluation. May consider PNCV/EMG studies and other studies pending follow-up evaluations  May consider radiofrequency rhizolysis or intraspinal procedures pending response to present treatment and F/U evaluation . We will avoid such treatment at this time  Patient to call [facility] should patient have concerns prior to scheduled return appointment.

## 2016-03-04 NOTE — Progress Notes (Signed)
Safety precautions to be maintained throughout the outpatient stay will include: orient to surroundings, keep bed in low position, maintain call bell within reach at all times, provide assistance with transfer out of bed and ambulation.  

## 2016-03-07 ENCOUNTER — Emergency Department
Admission: EM | Admit: 2016-03-07 | Discharge: 2016-03-07 | Disposition: A | Discharge status: Home / Self Care | Payer: 59 | Attending: Emergency Medicine | Admitting: Emergency Medicine

## 2016-03-07 ENCOUNTER — Emergency Department: Payer: 59

## 2016-03-07 ENCOUNTER — Encounter: Payer: Self-pay | Admitting: Emergency Medicine

## 2016-03-07 DIAGNOSIS — M5416 Radiculopathy, lumbar region: Secondary | ICD-10-CM | POA: Diagnosis not present

## 2016-03-07 DIAGNOSIS — Z79899 Other long term (current) drug therapy: Secondary | ICD-10-CM | POA: Diagnosis not present

## 2016-03-07 DIAGNOSIS — M7989 Other specified soft tissue disorders: Secondary | ICD-10-CM

## 2016-03-07 DIAGNOSIS — I10 Essential (primary) hypertension: Secondary | ICD-10-CM | POA: Diagnosis not present

## 2016-03-07 DIAGNOSIS — Z794 Long term (current) use of insulin: Secondary | ICD-10-CM | POA: Diagnosis not present

## 2016-03-07 DIAGNOSIS — Z7984 Long term (current) use of oral hypoglycemic drugs: Secondary | ICD-10-CM | POA: Insufficient documentation

## 2016-03-07 DIAGNOSIS — R2241 Localized swelling, mass and lump, right lower limb: Secondary | ICD-10-CM | POA: Diagnosis present

## 2016-03-07 DIAGNOSIS — Z7982 Long term (current) use of aspirin: Secondary | ICD-10-CM | POA: Diagnosis not present

## 2016-03-07 DIAGNOSIS — Z87891 Personal history of nicotine dependence: Secondary | ICD-10-CM | POA: Diagnosis not present

## 2016-03-07 DIAGNOSIS — E119 Type 2 diabetes mellitus without complications: Secondary | ICD-10-CM | POA: Diagnosis not present

## 2016-03-07 LAB — CBC WITH DIFFERENTIAL/PLATELET
BASOS ABS: 0 10*3/uL (ref 0–0.1)
Basophils Relative: 1 %
EOS PCT: 4 %
Eosinophils Absolute: 0.2 10*3/uL (ref 0–0.7)
HEMATOCRIT: 32.9 % — AB (ref 40.0–52.0)
Hemoglobin: 11.6 g/dL — ABNORMAL LOW (ref 13.0–18.0)
LYMPHS ABS: 1.7 10*3/uL (ref 1.0–3.6)
LYMPHS PCT: 30 %
MCH: 30.7 pg (ref 26.0–34.0)
MCHC: 35.4 g/dL (ref 32.0–36.0)
MCV: 86.8 fL (ref 80.0–100.0)
Monocytes Absolute: 0.3 10*3/uL (ref 0.2–1.0)
Monocytes Relative: 5 %
NEUTROS ABS: 3.4 10*3/uL (ref 1.4–6.5)
Neutrophils Relative %: 60 %
PLATELETS: 214 10*3/uL (ref 150–440)
RBC: 3.79 MIL/uL — AB (ref 4.40–5.90)
RDW: 13.8 % (ref 11.5–14.5)
WBC: 5.6 10*3/uL (ref 3.8–10.6)

## 2016-03-07 LAB — BASIC METABOLIC PANEL
ANION GAP: 8 (ref 5–15)
BUN: 20 mg/dL (ref 6–20)
CHLORIDE: 103 mmol/L (ref 101–111)
CO2: 27 mmol/L (ref 22–32)
Calcium: 9.4 mg/dL (ref 8.9–10.3)
Creatinine, Ser: 0.83 mg/dL (ref 0.61–1.24)
GFR calc Af Amer: >60 mL/min (ref >60)
GFR calc non Af Amer: >60 mL/min (ref >60)
GLUCOSE: 241 mg/dL — AB (ref 65–99)
POTASSIUM: 4.3 mmol/L (ref 3.5–5.1)
Sodium: 138 mmol/L (ref 135–145)

## 2016-03-07 NOTE — ED Notes (Signed)
See triage note   Swelling to right leg and ankle since this am w/o injury..  No deformity noted  Positive pulses

## 2016-03-07 NOTE — ED Triage Notes (Signed)
Right leg and ankle swelling since this morning.

## 2016-03-07 NOTE — Discharge Instructions (Signed)
Please wear compression stockings, keep the right lower extremity elevated. Follow-up with PCP if no improvement in 3-4 days. Follow-up with pain management physician PCP if worsening radicular pain. Return to the ER for any worsening symptoms urgent changes in her health.

## 2016-03-07 NOTE — ED Provider Notes (Signed)
[facility] Provider Note   CSN: [id number] Arrival date & time: 03/07/16  1455     History   Chief Complaint Chief Complaint  Patient presents with  . Leg Swelling    HPI Todd Banks is a 65 y.o. male presents to emergency department for evaluation of right leg swelling, numbness in the right foot. Patient has chronic back pain, status post lumbar back surgery years ago. He is currently being treated by pain management recently undergone epidural steroid injections for spinal stenosis of the lumbar spine. Recent MRI showed stable L5-S1, mild stenosis along L3-L4 and L4-L5. He's had no improvement with the assigned injections. Patient comes in today due to the swelling in the right leg, states he awoke this way this morning after sleeping vertical on the couch. Swelling is slightly improved over the day. He denies any chest pain shortness of breath. No history of blood clots. He has not worn any compression stockings. She denies any left leg swelling. He's had increasing numbness in the right footHPI  Past Medical History:  Diagnosis Date  . Anemia   . Anxiety   . Chronic back pain   . Constipation   . DDD (degenerative disc disease), lumbar   . Depression   . Diabetes mellitus without complication ([place])   . Dizziness   . Heart disease   . Hypertension   . Intermittent self-catheterization of bladder ([place])   . Kidney stones   . PUD (peptic ulcer disease)   . Shingles     Patient Active Problem List   Diagnosis Date Noted  . Neuropathy due to secondary diabetes ([place]) 12/17/2014  . DDD (degenerative disc disease), lumbosacral 11/19/2014  .  lumbar 11/19/2014  . DJD (degenerative joint disease)shoulder 11/19/2014  . Lumbar post-laminectomy syndrome 11/19/2014    Past Surgical History:  Procedure Laterality Date  . BACK SURGERY    . CARDIAC CATHETERIZATION N/A 08/28/2015   Procedure: Left Heart Cath and Coronary Angiography;  Surgeon: Isaias Cowman, MD;  Location: [facility];  Service: Cardiovascular;  Laterality: N/A;  . CHOLECYSTECTOMY    . CORONARY STENT PLACEMENT    . HAND SURGERY Right        Home Medications    Prior to Admission medications   Medication Sig Start Date End Date Taking? Authorizing Provider  ALPRAZolam Duanne Moron) 0.5 MG tablet Take 0.5 mg by mouth at bedtime as needed for anxiety.    Historical Provider, MD  ALPRAZolam Duanne Moron) 0.5 MG tablet Take 0.5 mg by mouth.    Historical Provider, MD  aspirin (GOODSENSE ASPIRIN) 325 MG tablet Take 325 mg by mouth.    Historical Provider, MD  aspirin 325 MG tablet Take 325 mg by mouth daily.    Historical Provider, MD  azithromycin (ZITHROMAX) 250 MG tablet Limit 2 tablets by mouth today  Then 1 tablet by mouth for the following 4 days if tolerated Patient not taking: Reported on 02/04/2016 01/30/16   Mohammed Kindle, MD  buPROPion [facility] SR) 150 MG 12 hr tablet Take 150 mg by mouth.    Historical Provider, MD  buPROPion (ZYBAN) 150 MG 12 hr tablet Take 150 mg by mouth 3 (three) times daily.    Historical Provider, MD  cyanocobalamin (,VITAMIN B-12,) 1000 MCG/ML injection  12/28/15   Historical Provider, MD  docusate sodium (COLACE) 50 MG capsule Take by mouth.    Historical Provider, MD  escitalopram (LEXAPRO) 20 MG tablet Take 20 mg by mouth daily.  Historical Provider, MD  escitalopram (LEXAPRO) 20 MG tablet Take 20 mg by mouth.    Historical Provider, MD  fluticasone (FLONASE) 50 MCG/ACT nasal spray Place into the nose. 11/22/14   Historical Provider, MD  gemfibrozil (LOPID) 600 MG tablet Take 600 mg by mouth 2 (two) times daily before a meal.    Historical Provider, MD  gemfibrozil (LOPID) 600 MG tablet Take 600 mg by mouth.    Historical Provider, MD  glucose blood (ONE TOUCH ULTRA TEST) test strip Use as instructed 5 times daily. 02/16/14   Historical Provider, MD  Insulin Glargine (LANTUS SOLOSTAR) 100 UNIT/ML Solostar Pen Inject into the skin.  09/30/15   Historical Provider, MD  insulin glargine (LANTUS) 100 UNIT/ML injection Inject 70 Units into the skin at bedtime.     Historical Provider, MD  insulin lispro (HUMALOG KWIKPEN) 100 UNIT/ML KiwkPen  06/06/13   Historical Provider, MD  insulin lispro (HUMALOG) 100 UNIT/ML injection 20 Units 3 (three) times daily before meals. Sliding scale    Historical Provider, MD  Insulin Pen Needle (EXEL COMFORT POINT PEN NEEDLE) 29G X 12MM MISC Use one four times daily. 09/30/15   Historical Provider, MD  isosorbide dinitrate (ISORDIL) 30 MG tablet Take 30 mg by mouth 2 (two) times daily.    Historical Provider, MD  isosorbide mononitrate (IMDUR) 30 MG 24 hr tablet  02/05/16   Historical Provider, MD  metFORMIN (GLUCOPHAGE) 1000 MG tablet Take 1,000 mg by mouth 2 (two) times daily with a meal.    Historical Provider, MD  metFORMIN (GLUCOPHAGE) 1000 MG tablet Take 1,000 mg by mouth.    Historical Provider, MD  methadone (DOLOPHINE) 10 MG tablet Limit  1 tab po bid - tid if tolerated 03/04/16   Mohammed Kindle, MD  methadone (DOLOPHINE) 10 MG tablet Take by mouth. 11/22/13   Historical Provider, MD  metoprolol succinate (TOPROL-XL) 25 MG 24 hr tablet Take 25 mg by mouth daily.    Historical Provider, MD  metoprolol succinate (TOPROL-XL) 25 MG 24 hr tablet 25 mg. 06/12/13   Historical Provider, MD  NEEDLE, DISP, 25 G (BD SAFETYGLIDE SHIELDED NEEDLE) 25G X 1" MISC Use 1 each monthly. for B12 injections 06/22/15   Historical Provider, MD  nitrofurantoin, macrocrystal-monohydrate, (MACROBID) 100 MG capsule  02/17/16   Historical Provider, MD  nitroGLYCERIN (NITROSTAT) 0.4 MG SL tablet Place under the tongue. 09/04/15   Historical Provider, MD  omeprazole (PRILOSEC) 20 MG capsule Take 20 mg by mouth 2 (two) times daily before a meal.    Historical Provider, MD  omeprazole (PRILOSEC) 20 MG capsule Take 20 mg by mouth.    Historical Provider, MD  orphenadrine (NORFLEX) 100 MG tablet Limit  1 tab po / day or bid  If  tolerated 03/04/16   Mohammed Kindle, MD  orphenadrine (NORFLEX) 100 MG tablet Take by mouth. 04/20/13   Historical Provider, MD  oxyCODONE (OXY IR/ROXICODONE) 5 MG immediate release tablet LIMIT 1 TAB BY MOUTH 2 TO 4 TIMES PER DAY FOR BREAKTHROUGH PAIN WHILE TAKING METHADONE IF TOLERATED 12/19/15   Historical Provider, MD  oxycodone (OXY-IR) 5 MG capsule Limit  1 tab po  2 - 4 times / day for breakthrough pain while taking methadone if tolerated 03/04/16   Mohammed Kindle, MD  pioglitazone (ACTOS) 15 MG tablet Take by mouth. 09/30/15 09/29/16  Historical Provider, MD  prasugrel (EFFIENT) 10 MG TABS tablet Take 10 mg by mouth daily.    Historical Provider, MD  prasugrel (EFFIENT) 10 MG  TABS tablet 10 mg. 06/12/13   Historical Provider, MD  pravastatin (PRAVACHOL) 40 MG tablet Take 40 mg by mouth daily.    Historical Provider, MD  sulfamethoxazole-trimethoprim (BACTRIM DS,SEPTRA DS) 800-160 MG tablet  02/13/16   Historical Provider, MD  SYRINGE-NEEDLE, DISP, 3 ML (MEDSAVER SYRINGE 3CC/23GX1") 23G X 1" 3 ML MISC Use as directed with  cyanocobalamin 12/31/15   Historical Provider, MD  traZODone (DESYREL) 150 MG tablet Take 150 mg by mouth at bedtime.    Historical Provider, MD  traZODone (DESYREL) 150 MG tablet Take 150 mg by mouth.    Historical Provider, MD    Family History Family History  Problem Relation Age of Onset  . Diabetes Mother   . Heart disease Mother   . Hypertension Mother   . Depression Father   . Heart disease Father   . Hypertension Father     Social History Social History  Substance Use Topics  . Smoking status: Former Smoker    Quit date: 07/22/1993  . Smokeless tobacco: Never Used  . Alcohol use No     Allergies   Cephalexin; Cephalosporins; Ampicillin; Cefazolin; Cefuroxime axetil; Ciprofloxacin; Clindamycin; Doxycycline; Erythromycin; Latex; Levofloxacin; Other; Penicillins; Pork (porcine) protein; and Prevacid [lansoprazole]   Review of Systems Review of Systems    Constitutional: Negative for chills and fever.  HENT: Negative for ear pain and sore throat.   Eyes: Negative for pain and visual disturbance.  Respiratory: Negative for cough and shortness of breath.   Cardiovascular: Negative for chest pain and palpitations.  Gastrointestinal: Negative for abdominal pain and vomiting.  Genitourinary: Negative for dysuria and hematuria.  Musculoskeletal: Negative for arthralgias and back pain.       Positive right lower extremity swelling, no edema  Skin: Negative for color change and rash.  Neurological: Negative for seizures and syncope.  All other systems reviewed and are negative.    Physical Exam Updated Vital Signs BP (!) 150/80 (BP Location: Right Arm)   Pulse 80   Temp 97.8 F (36.6 C) (Oral)   Resp 18   Ht 5\' 9"  (1.753 m)   Wt 99.8 kg   SpO2 98%   BMI 32.49 kg/m   Physical Exam  Constitutional: He appears well-developed and well-nourished.  HENT:  Head: Normocephalic and atraumatic.  Eyes: Conjunctivae are normal.  Neck: Neck supple.  Cardiovascular: Normal rate and regular rhythm.   No murmur heard. Pulmonary/Chest: Effort normal and breath sounds normal. No respiratory distress. He has no wheezes. He has no rales.  Abdominal: Soft. There is no tenderness.  Musculoskeletal:  Bilateral Lower Extremities: Examination of the lower extremities reveals no bony abnormality, no edema, and no ecchymosis.  Right greater than left leg swelling, mild. The patient has full active and passive range of motion of the hips, knees, and ankles.  There is no discomfort with range of motion exercises.  The patient is non tender along the greater trochanter region.  The patient has a negative Bevelyn Buckles' test bilaterally.  There is normal skin warmth.  There is normal capillary refill bilaterally.    Neurologic: The patient has a negative straight leg raise.  The patient has normal muscle strength testing for the quadriceps, calves, ankle dorsiflexion,  ankle plantarflexion, and extensor hallicus longus.  The patient has sensation that is slightly decreased on the dorsum of the right foot with compared to the left..  The deep tendon reflexes are normal at the patella bilaterally.  Neurological: He is alert.  Skin: Skin is  warm and dry.  Psychiatric: He has a normal mood and affect.  Nursing note and vitals reviewed.    ED Treatments / Results  Labs (all labs ordered are listed, but only abnormal results are displayed) Labs Reviewed  CBC WITH DIFFERENTIAL/PLATELET - Abnormal; Notable for the following:       Result Value   RBC 3.79 (*)    Hemoglobin 11.6 (*)    HCT 32.9 (*)    All other components within normal limits  BASIC METABOLIC PANEL - Abnormal; Notable for the following:    Glucose, Bld 241 (*)    All other components within normal limits    EKG  EKG Interpretation None       Radiology US Venous Img Lower Unilateral Right  Result Date: 03/07/2016 CLINICAL DATA:  Right leg and ankle swelling for 1 day EXAM: RIGHT LOWER EXTREMITY VENOUS DUPLEX ULTRASOUND TECHNIQUE: Doppler venous assessment of the right lower extremity deep venous system was performed, including characterization of spectral flow, compressibility, and phasicity. COMPARISON:  None. FINDINGS: There is complete compressibility of the right common femoral, femoral, and popliteal veins. Doppler analysis demonstrates respiratory phasicity and augmentation of flow with calf compression. No obvious superficial vein or calf vein thrombosis. IMPRESSION: No evidence of right lower extremity DVT. Electronically Signed   By: Marybelle Killings M.D.   On: 03/07/2016 16:11    Procedures Procedures (including critical care time)  Medications Ordered in ED Medications - No data to display   Initial Impression / Assessment and Plan / ED Course  I have reviewed the triage vital signs and the nursing notes.  Pertinent labs & imaging results that were available during my care  of the patient were reviewed by me and considered in my medical decision making (see chart for details).  Clinical Course   65 year old male with chronic back pain and lumbar stenosis. Currently undergoing epidural steroid injections with pain management. He states he's had increased numbness in the right foot with no weakness or neurological deficits on exam. He has also had increased swelling in the right leg since this morning. Swelling has improved throughout the day. Ultrasound showed no evidence of DVT. Patient will wear compression stockings, keep elevated. Follow-up with PCP. Final diagnoses:  Right leg swelling  Lumbar radiculopathy    New Prescriptions Discharge Medication List as of 03/07/2016  6:45 PM       Duanne Guess, PA-C 03/07/16 1903    Harvest Dark, MD 03/07/16 2259

## 2016-03-19 ENCOUNTER — Telehealth: Payer: Self-pay | Admitting: *Deleted

## 2016-03-30 ENCOUNTER — Other Ambulatory Visit: Payer: Self-pay | Admitting: Pain Medicine

## 2016-03-31 ENCOUNTER — Other Ambulatory Visit: Payer: Self-pay | Admitting: Pain Medicine

## 2016-04-03 ENCOUNTER — Ambulatory Visit: Payer: Medicare Other | Admitting: Pain Medicine

## 2016-04-16 DIAGNOSIS — E669 Obesity, unspecified: Secondary | ICD-10-CM | POA: Insufficient documentation

## 2016-05-15 ENCOUNTER — Other Ambulatory Visit: Payer: Self-pay | Admitting: Adult Health

## 2016-05-15 ENCOUNTER — Ambulatory Visit
Admission: RE | Admit: 2016-05-15 | Discharge: 2016-05-15 | Disposition: A | Discharge status: Home / Self Care | Payer: 59 | Source: Ambulatory Visit | Attending: Adult Health | Admitting: Adult Health

## 2016-05-15 DIAGNOSIS — M542 Cervicalgia: Secondary | ICD-10-CM

## 2016-05-15 DIAGNOSIS — I6523 Occlusion and stenosis of bilateral carotid arteries: Secondary | ICD-10-CM | POA: Insufficient documentation

## 2016-05-15 DIAGNOSIS — R0989 Other specified symptoms and signs involving the circulatory and respiratory systems: Secondary | ICD-10-CM | POA: Diagnosis present

## 2016-06-11 ENCOUNTER — Encounter (HOSPITAL_COMMUNITY): Payer: Self-pay

## 2016-06-20 NOTE — Progress Notes (Signed)
Noted.please make sure we have notes before appt

## 2016-06-25 ENCOUNTER — Encounter: Payer: Self-pay | Admitting: Psychiatry

## 2016-06-25 ENCOUNTER — Ambulatory Visit (INDEPENDENT_AMBULATORY_CARE_PROVIDER_SITE_OTHER): Payer: 59 | Admitting: Psychiatry

## 2016-06-25 VITALS — BP 146/71 | HR 86 | Temp 97.8°F | Wt 209.4 lb

## 2016-06-25 DIAGNOSIS — G894 Chronic pain syndrome: Secondary | ICD-10-CM | POA: Diagnosis not present

## 2016-06-25 DIAGNOSIS — F331 Major depressive disorder, recurrent, moderate: Secondary | ICD-10-CM

## 2016-06-25 MED ORDER — BUPROPION HCL ER (SR) 150 MG PO TB12: 150.0000 mg | ORAL_TABLET | Freq: Every day | ORAL | 1 refills | Status: DC

## 2016-06-25 MED ORDER — TRAZODONE HCL 150 MG PO TABS: 150.0000 mg | ORAL_TABLET | Freq: Every day | ORAL | 0 refills | Status: DC

## 2016-06-25 MED ORDER — ESCITALOPRAM OXALATE 20 MG PO TABS: 20.0000 mg | ORAL_TABLET | Freq: Every day | ORAL | 1 refills | Status: DC

## 2016-06-25 NOTE — Progress Notes (Signed)
Psychiatric Initial Adult Assessment   Patient Identification: Todd Banks MRN:  [id number] Date of Evaluation:  06/25/2016 Referral Source: Dr Toy Care Chief Complaint:   Chief Complaint    Establish Care; Anxiety; Depression     Visit Diagnosis:    ICD-9-CM ICD-10-CM   1. MDD (major depressive disorder), recurrent episode, moderate (HCC) 296.32 F33.1   2. Chronic pain syndrome 338.4 G89.4     History of Present Illness:    Pt  is a 65 year old married male who presented for initial assessment. He reported that he has history of depression and was following with Dr. Toy Care  in [place] but was last seen in every. He reported that he has history of chronic pain and he currently stays at home. He spends most of the time sleeping. He reported that he has lack of energy depressed mood and anhedonia. He denied having any suicidal ideations or plans. He reported that his wife works in [employer]. and he does not like staying at home. He is currently taking medications. He wants medication adjustment. He appeared calm and alert during the interview. He is also taking methadone 10 mg 3 times a day for his chronic pain. He is med compliant. He currently denied having any suicidal ideations or plans. He denied having any perceptual disturbances.    Associated Signs/Symptoms: Depression Symptoms:  depressed mood, psychomotor retardation, fatigue, anxiety, loss of energy/fatigue, disturbed sleep, decreased appetite, (Hypo) Manic Symptoms:  Irritable Mood, Labiality of Mood, Anxiety Symptoms:  Excessive Worry, Psychotic Symptoms:  none PTSD Symptoms: Negative NA  Past Psychiatric History:  Admitted to [facility]- past No suicide attempt  Previous Psychotropic Medications: zoloft   Substance Abuse History in the last 12 months:  No.  Consequences of Substance Abuse: Negative NA  Past Medical History:  Past Medical History:  Diagnosis Date  . Anemia   . Anxiety   . Chronic back  pain   . Constipation   . DDD (degenerative disc disease), lumbar   . Depression   . Diabetes mellitus without complication ([place])   . Diabetes mellitus, type II ([place])   . Dizziness   . Heart disease   . Hypertension   . Intermittent self-catheterization of bladder   . Kidney stones   . PUD (peptic ulcer disease)   . Shingles     Past Surgical History:  Procedure Laterality Date  . BACK SURGERY    . CARDIAC CATHETERIZATION N/A 08/28/2015   Procedure: Left Heart Cath and Coronary Angiography;  Surgeon: Isaias Cowman, MD;  Location: [facility];  Service: Cardiovascular;  Laterality: N/A;  . CHOLECYSTECTOMY    . CORONARY STENT PLACEMENT    . HAND SURGERY Right     Family Psychiatric History:  Father- alcohol Son, daughter- Depression   Family History:  Family History  Problem Relation Age of Onset  . Diabetes Mother   . Heart disease Mother   . Hypertension Mother   . Depression Father   . Heart disease Father   . Hypertension Father     Social History:   Social History   Social History  . Marital status: Married    Spouse name: N/A  . Number of children: N/A  . Years of education: N/A   Social History Main Topics  . Smoking status: Former Smoker    Quit date: 07/22/1993  . Smokeless tobacco: Never Used  . Alcohol use No  . Drug use: No  . Sexual activity: Not Currently   Other Topics  Concern  . None   Social History Narrative  . 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  Married - 27 +   Has 3 girls, 1 boys.   Allergies:   Allergies  Allergen Reactions  . Cephalexin Hives    Other reaction(s): Unknown Other reaction(s): Unknown  . Cephalosporins Swelling    Other Reaction: reacts to Keflex  . Ampicillin Swelling    Other reaction(s): Unknown  . Cefazolin Swelling    Other reaction(s): Other (qualifier value)  . Cefuroxime Axetil Swelling  . Ciprofloxacin Swelling     Other reaction(s): Respiratory distress (finding), We[place]  . Clindamycin   . Doxycycline Swelling    Other reaction(s): Unknown  . Erythromycin Swelling    Other reaction(s): Unknown  . Latex     Other reaction(s): Unknown  . Levofloxacin Swelling    Other reaction(s): Altered mental status (finding), Itching of Skin  . Other Swelling    All Mycins..  . Penicillins Swelling  . Pork (Porcine) Protein     Other reaction(s): Other (qualifier value)  . Prevacid [Lansoprazole]     Other reaction(s): Unknown Other reaction(s): Unknown    Metabolic Disorder Labs: No results found for: HGBA1C, MPG No results found for: PROLACTIN No results found for: CHOL, TRIG, HDL, CHOLHDL, VLDL, LDLCALC   Current Medications: Current Outpatient Prescriptions  Medication Sig Dispense Refill  . ALPRAZolam (XANAX) 0.5 MG tablet Take 0.5 mg by mouth at bedtime as needed for anxiety.    Marland Kitchen aspirin 325 MG tablet Take 325 mg by mouth daily.    Marland Kitchen buPROPion (WELLBUTRIN SR) 150 MG 12 hr tablet Take 150 mg by mouth.    . cyanocobalamin (,VITAMIN B-12,) 1000 MCG/ML injection     . docusate sodium (COLACE) 50 MG capsule Take by mouth.    . escitalopram (LEXAPRO) 20 MG tablet Take 20 mg by mouth daily.    Marland Kitchen gemfibrozil (LOPID) 600 MG tablet Take 600 mg by mouth 2 (two) times daily before a meal.    . glucose blood (ONE TOUCH ULTRA TEST) test strip Use as instructed 5 times daily.    . insulin glargine (LANTUS) 100 UNIT/ML injection Inject 70 Units into the skin at bedtime.     . insulin lispro (HUMALOG) 100 UNIT/ML injection 20 Units 3 (three) times daily before meals. Sliding scale    . Insulin Pen Needle (EXEL COMFORT POINT PEN NEEDLE) 29G X 12MM MISC Use one four times daily.    . isosorbide dinitrate (ISORDIL) 30 MG tablet Take 30 mg by mouth 2 (two) times daily.    . isosorbide mononitrate (IMDUR) 30 MG 24 hr tablet     . metFORMIN (GLUCOPHAGE) 1000 MG tablet Take 1,000 mg by mouth 2 (two) times daily with a  meal.    . metFORMIN (GLUCOPHAGE) 1000 MG tablet Take 1,000 mg by mouth.    . methadone (DOLOPHINE) 10 MG tablet Take by mouth.    . metoprolol succinate (TOPROL-XL) 25 MG 24 hr tablet Take 25 mg by mouth daily.    Marland Kitchen NEEDLE, DISP, 25 G (BD SAFETYGLIDE SHIELDED NEEDLE) 25G X 1" MISC Use 1 each monthly. for B12 injections    . nitroGLYCERIN (NITROSTAT) 0.4 MG SL tablet Place under the tongue.    Marland Kitchen omeprazole (PRILOSEC) 20 MG capsule Take 20 mg by mouth 2 (two) times daily before a meal.    . omeprazole (PRILOSEC) 20 MG capsule Take 20 mg  by mouth.    . orphenadrine (NORFLEX) 100 MG tablet Limit  1 tab po / day or bid  If tolerated 60 tablet 2  . oxyCODONE (OXY IR/ROXICODONE) 5 MG immediate release tablet LIMIT 1 TAB BY MOUTH 2 TO 4 TIMES PER DAY FOR BREAKTHROUGH PAIN WHILE TAKING METHADONE IF TOLERATED  0  . pioglitazone (ACTOS) 15 MG tablet Take by mouth.    . prasugrel (EFFIENT) 10 MG TABS tablet Take 10 mg by mouth daily.    . pravastatin (PRAVACHOL) 40 MG tablet Take 40 mg by mouth daily.    Marland Kitchen sulfamethoxazole-trimethoprim (BACTRIM DS,SEPTRA DS) 800-160 MG tablet     . SYRINGE-NEEDLE, DISP, 3 ML (MEDSAVER SYRINGE 3CC/23GX1") 23G X 1" 3 ML MISC Use as directed with  cyanocobalamin    . traZODone (DESYREL) 150 MG tablet Take 150 mg by mouth at bedtime.     No current facility-administered medications for this visit.     Neurologic: Headache: No Seizure: No Paresthesias:No  Musculoskeletal: Strength & Muscle Tone: within normal limits Gait & Station: normal Patient leans: N/A  Psychiatric Specialty Exam: ROS  Blood pressure (!) 146/71, pulse 86, temperature 97.8 F (36.6 C), temperature source Oral, weight 209 lb 6.4 oz (95 kg).Body mass index is 30.92 kg/m.  General Appearance: Casual  Eye Contact:  Fair  Speech:  Clear and Coherent  Volume:  Normal  Mood:  Anxious and Depressed  Affect:  Congruent  Thought Process:  Coherent  Orientation:  Full (Time, Place, and Person)   Thought Content:  Logical  Suicidal Thoughts:  No  Homicidal Thoughts:  No  Memory:  Immediate;   Fair Recent;   Fair Remote;   Fair  Judgement:  Fair  Insight:  Fair  Psychomotor Activity:  Normal  Concentration:  Concentration: Fair and Attention Span: Fair  Recall:  [employer] of Knowledge:Fair  Language: Fair  Akathisia:  No  Handed:  Right  AIMS (if indicated):    Assets:  Communication Skills Desire for Improvement Social Support  ADL's:  Intact  Cognition: WNL  Sleep:      Treatment Plan Summary: Medication management  Discussed with patient about his medications. He will continue on Wellbutrin XL 150 mg in the morning. Will take Lexapro 20 mg at noon. Advised decrease the dose of trazodone 75 mg at bedtime to decrease the sedation and he agreed with the plan. Also advised to decrease the dose of methadone 5 mg in the afternoon and he agreed with the plan.  Follow-up in one month or earlier depending on his symptoms   More than 50% of the time spent in psychoeducation, counseling and coordination of care.    This note was generated in part or whole with voice recognition software. Voice regonition is usually quite accurate but there are transcription errors that can and very often do occur. I apologize for any typographical errors that were not detected and corrected.   Rainey Pines, MD 12/13/20173:13 PM

## 2016-07-02 ENCOUNTER — Ambulatory Visit: Payer: Self-pay | Admitting: Psychiatry

## 2016-07-17 ENCOUNTER — Ambulatory Visit (INDEPENDENT_AMBULATORY_CARE_PROVIDER_SITE_OTHER): Payer: 59 | Admitting: Psychiatry

## 2016-07-17 ENCOUNTER — Encounter: Payer: Self-pay | Admitting: Psychiatry

## 2016-07-17 VITALS — BP 160/78 | HR 80 | Temp 97.9°F | Wt 211.8 lb

## 2016-07-17 DIAGNOSIS — G894 Chronic pain syndrome: Secondary | ICD-10-CM | POA: Diagnosis not present

## 2016-07-17 DIAGNOSIS — F331 Major depressive disorder, recurrent, moderate: Secondary | ICD-10-CM

## 2016-07-17 MED ORDER — TRAZODONE HCL 150 MG PO TABS: 150.0000 mg | ORAL_TABLET | Freq: Every day | ORAL | 1 refills | Status: DC

## 2016-07-17 MED ORDER — BUPROPION HCL ER (SR) 150 MG PO TB12: 150.0000 mg | ORAL_TABLET | Freq: Every day | ORAL | 1 refills | Status: DC

## 2016-07-17 MED ORDER — ESCITALOPRAM OXALATE 20 MG PO TABS: 20.0000 mg | ORAL_TABLET | Freq: Every day | ORAL | 1 refills | Status: DC
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
## 2016-07-28 ENCOUNTER — Other Ambulatory Visit: Payer: Self-pay | Admitting: Pain Medicine

## 2016-08-18 ENCOUNTER — Encounter: Payer: Self-pay | Admitting: Psychiatry

## 2016-08-18 ENCOUNTER — Ambulatory Visit (INDEPENDENT_AMBULATORY_CARE_PROVIDER_SITE_OTHER): Payer: 59 | Admitting: Psychiatry

## 2016-08-18 VITALS — BP 154/73 | HR 106 | Temp 98.5°F | Wt 209.0 lb

## 2016-08-18 DIAGNOSIS — G894 Chronic pain syndrome: Secondary | ICD-10-CM

## 2016-08-18 DIAGNOSIS — F331 Major depressive disorder, recurrent, moderate: Secondary | ICD-10-CM | POA: Diagnosis not present

## 2016-08-18 MED ORDER — TRAZODONE HCL 150 MG PO TABS: 150.0000 mg | ORAL_TABLET | Freq: Every day | ORAL | 1 refills | Status: DC

## 2016-08-18 MED ORDER — BUPROPION HCL ER (SR) 150 MG PO TB12: 150.0000 mg | ORAL_TABLET | Freq: Every day | ORAL | 1 refills | Status: DC

## 2016-08-18 MED ORDER — ESCITALOPRAM OXALATE 20 MG PO TABS: 20.0000 mg | ORAL_TABLET | Freq: Every day | ORAL | 1 refills | Status: DC

## 2016-08-18 NOTE — Progress Notes (Signed)
Psychiatric MD Progress Note  Patient Identification: Todd Banks MRN:  [id number] Date of Evaluation:  08/18/2016 Referral Source: Dr Toy Care Chief Complaint:   Chief Complaint    Follow-up; Medication Refill     Visit Diagnosis:    ICD-9-CM ICD-10-CM   1. MDD (major depressive disorder), recurrent episode, moderate (HCC) 296.32 F33.1   2. Chronic pain syndrome 338.4 G89.4     History of Present Illness:    Pt  is a 66 year old married male who presented for follow up.Marland Kitchen He reported that he has history of depression and was following with Dr. Toy Care  in [place].PatientReported that he continues to feel tired and has been taking pain medications on a regular basis. He reported that his wife works in [employer]. and she will leave early in the morning. He will go back to bed after that. He reported that he has been taking 2 types of pain medications on a regular basis. He stated that he has been compliant with his medications. We discussed about his medications and he stated that they are helpful. He denied having any suicidal homicidal ideations or plans. He denied having any perceptual disturbances. He appeared calm and alert during the interview.  He shouldn't reported that he enjoys reading books and will be awake till  late in the night reading books.  His wife works in [employer]. and he does not like staying at home. He currently denied having any suicidal ideations or plans. He denied having any perceptual disturbances.    Associated Signs/Symptoms: Depression Symptoms:  depressed mood, anxiety, decreased appetite, (Hypo) Manic Symptoms:  Irritable Mood, Labiality of Mood, Anxiety Symptoms:  Excessive Worry, Psychotic Symptoms:  none PTSD Symptoms: Negative NA  Past Psychiatric History:  Admitted to [facility]- past No suicide attempt  Previous Psychotropic Medications: zoloft   Substance Abuse History in the last 12 months:  No.  Consequences of Substance  Abuse: Negative NA  Past Medical History:  Past Medical History:  Diagnosis Date  . Anemia   . Anxiety   . Chronic back pain   . Constipation   . DDD (degenerative disc disease), lumbar   . Depression   . Diabetes mellitus without complication ([place])   . Diabetes mellitus, type II ([place])   . Dizziness   . Heart disease   . Hypertension   . Intermittent self-catheterization of bladder   . Kidney stones   . PUD (peptic ulcer disease)   . Shingles     Past Surgical History:  Procedure Laterality Date  . BACK SURGERY    . CARDIAC CATHETERIZATION N/A 08/28/2015   Procedure: Left Heart Cath and Coronary Angiography;  Surgeon: Isaias Cowman, MD;  Location: [facility];  Service: Cardiovascular;  Laterality: N/A;  . CHOLECYSTECTOMY    . CORONARY STENT PLACEMENT    . HAND SURGERY Right     Family Psychiatric History:  Father- alcohol Son, daughter- Depression   Family History:  Family History  Problem Relation Age of Onset  . Diabetes Mother   . Heart disease Mother   . Hypertension Mother   . Depression Father   . Heart disease Father   . Hypertension Father     Social History:   Social History   Social History  . Marital status: Married    Spouse name: N/A  . Number of children: N/A  . Years of education: N/A   Social History Main Topics  . Smoking status: Former Smoker    Quit date:  07/22/1993  . Smokeless tobacco: Never Used  . Alcohol use No  . Drug use: No  . Sexual activity: Not Currently   Other Topics Concern  . None   Social History Narrative  . 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  Married - 66 +   Has 3 girls, 1 boys.   Allergies:   Allergies  Allergen Reactions  . Cephalexin Hives    Other reaction(s): Unknown Other reaction(s): Unknown  . Cephalosporins Swelling    Other Reaction: reacts to Keflex  . Ampicillin Swelling    Other reaction(s): Unknown  .  Cefazolin Swelling    Other reaction(s): Other (qualifier value)  . Cefuroxime Axetil Swelling  . Ciprofloxacin Swelling    Other reaction(s): Respiratory distress (finding), Weal  . Clindamycin   . Doxycycline Swelling    Other reaction(s): Unknown  . Erythromycin Swelling    Other reaction(s): Unknown  . Latex     Other reaction(s): Unknown  . Levofloxacin Swelling    Other reaction(s): Altered mental status (finding), Itching of Skin  . Other Swelling    All Mycins..  . Penicillins Swelling  . Pork (Porcine) Protein     Other reaction(s): Other (qualifier value)  . Prevacid [Lansoprazole]     Other reaction(s): Unknown Other reaction(s): Unknown    Metabolic Disorder Labs: No results found for: HGBA1C, MPG No results found for: PROLACTIN No results found for: CHOL, TRIG, HDL, CHOLHDL, VLDL, LDLCALC   Current Medications: Current Outpatient Prescriptions  Medication Sig Dispense Refill  . aspirin 325 MG tablet Take 325 mg by mouth daily.    Marland Kitchen buPROPion (WELLBUTRIN SR) 150 MG 12 hr tablet Take 1 tablet (150 mg total) by mouth daily. 90 tablet 1  . cyanocobalamin (,VITAMIN B-12,) 1000 MCG/ML injection     . docusate sodium (COLACE) 50 MG capsule Take by mouth.    . escitalopram (LEXAPRO) 20 MG tablet Take 1 tablet (20 mg total) by mouth daily. 90 tablet 1  . gemfibrozil (LOPID) 600 MG tablet Take 600 mg by mouth 2 (two) times daily before a meal.    . glucose blood (ONE TOUCH ULTRA TEST) test strip Use as instructed 5 times daily.    . insulin glargine (LANTUS) 100 UNIT/ML injection Inject 70 Units into the skin at bedtime.     . insulin lispro (HUMALOG) 100 UNIT/ML injection 20 Units 3 (three) times daily before meals. Sliding scale    . Insulin Pen Needle (EXEL COMFORT POINT PEN NEEDLE) 29G X 12MM MISC Use one four times daily.    . isosorbide dinitrate (ISORDIL) 30 MG tablet Take 30 mg by mouth 2 (two) times daily.    . isosorbide mononitrate (IMDUR) 30 MG 24 hr tablet      . metFORMIN (GLUCOPHAGE) 1000 MG tablet Take 1,000 mg by mouth 2 (two) times daily with a meal.    . methadone (DOLOPHINE) 10 MG tablet Take by mouth.    . metoprolol succinate (TOPROL-XL) 25 MG 24 hr tablet Take 25 mg by mouth daily.    Marland Kitchen NEEDLE, DISP, 25 G (BD SAFETYGLIDE SHIELDED NEEDLE) 25G X 1" MISC Use 1 each monthly. for B12 injections    . nitroGLYCERIN (NITROSTAT) 0.4 MG SL tablet Place under the tongue.    Marland Kitchen omeprazole (PRILOSEC) 20 MG capsule Take 20 mg by mouth 2 (two) times daily before a meal.    . orphenadrine (NORFLEX) 100 MG tablet Limit  1 tab po / day or bid  If tolerated 60 tablet 2  . oxyCODONE (OXY IR/ROXICODONE) 5 MG immediate release tablet LIMIT 1 TAB BY MOUTH 2 TO 4 TIMES PER DAY FOR BREAKTHROUGH PAIN WHILE TAKING METHADONE IF TOLERATED  0  . pioglitazone (ACTOS) 15 MG tablet Take by mouth.    . prasugrel (EFFIENT) 10 MG TABS tablet Take 10 mg by mouth daily.    . pravastatin (PRAVACHOL) 40 MG tablet Take 40 mg by mouth daily.    . SYRINGE-NEEDLE, DISP, 3 ML (MEDSAVER SYRINGE 3CC/23GX1") 23G X 1" 3 ML MISC Use as directed with  cyanocobalamin    . traZODone (DESYREL) 150 MG tablet Take 1 tablet (150 mg total) by mouth at bedtime. 90 tablet 1   No current facility-administered medications for this visit.     Neurologic: Headache: No Seizure: No Paresthesias:No  Musculoskeletal: Strength & Muscle Tone: within normal limits Gait & Station: normal Patient leans: N/A  Psychiatric Specialty Exam: ROS  Blood pressure (!) 154/73, pulse (!) 106, temperature 98.5 F (36.9 C), temperature source Oral, weight 209 lb (94.8 kg).Body mass index is 30.86 kg/m.  General Appearance: Casual  Eye Contact:  Fair  Speech:  Clear and Coherent  Volume:  Normal  Mood:  Anxious and Depressed  Affect:  Congruent  Thought Process:  Coherent  Orientation:  Full (Time, Place, and Person)  Thought Content:  Logical  Suicidal Thoughts:  No  Homicidal Thoughts:  No  Memory:   Immediate;   Fair Recent;   Fair Remote;   Fair  Judgement:  Fair  Insight:  Fair  Psychomotor Activity:  Normal  Concentration:  Concentration: Fair and Attention Span: Fair  Recall:  [employer] of Knowledge:Fair  Language: Fair  Akathisia:  No  Handed:  Right  AIMS (if indicated):    Assets:  Communication Skills Desire for Improvement Social Support  ADL's:  Intact  Cognition: WNL  Sleep:      Treatment Plan Summary: Medication management  Discussed with patient about his medications. He will continue on Wellbutrin XL 150 mg in the morning. Will take Lexapro 20 mg at noon. A she is taking trazodone 150-75 mg at bedtime to help with the sleep.   Follow-up in 2 month or earlier depending on his symptoms   More than 50% of the time spent in psychoeducation, counseling and coordination of care.    This note was generated in part or whole with voice recognition software. Voice regonition is usually quite accurate but there are transcription errors that can and very often do occur. I apologize for any typographical errors that were not detected and corrected.   Rainey Pines, MD 2/5/20182:54 PM

## 2016-09-23 ENCOUNTER — Other Ambulatory Visit: Payer: Self-pay | Admitting: Pain Medicine

## 2016-09-23 DIAGNOSIS — G8929 Other chronic pain: Secondary | ICD-10-CM

## 2016-09-23 DIAGNOSIS — M5441 Lumbago with sciatica, right side: Principal | ICD-10-CM

## 2016-09-26 ENCOUNTER — Encounter: Payer: Self-pay | Admitting: *Deleted

## 2016-09-29 ENCOUNTER — Ambulatory Visit
Admission: RE | Admit: 2016-09-29 | Discharge: 2016-09-29 | Disposition: A | Discharge status: Home / Self Care | Payer: 59 | Source: Ambulatory Visit | Attending: Unknown Physician Specialty | Admitting: Unknown Physician Specialty

## 2016-09-29 ENCOUNTER — Encounter
Admission: RE | Disposition: A | Discharge status: Home / Self Care | Payer: Self-pay | Source: Ambulatory Visit | Attending: Unknown Physician Specialty

## 2016-09-29 ENCOUNTER — Encounter: Payer: Self-pay | Admitting: Anesthesiology

## 2016-09-29 ENCOUNTER — Ambulatory Visit: Payer: 59 | Admitting: Anesthesiology

## 2016-09-29 DIAGNOSIS — M1990 Unspecified osteoarthritis, unspecified site: Secondary | ICD-10-CM | POA: Diagnosis not present

## 2016-09-29 DIAGNOSIS — K2970 Gastritis, unspecified, without bleeding: Secondary | ICD-10-CM | POA: Insufficient documentation

## 2016-09-29 DIAGNOSIS — Z87891 Personal history of nicotine dependence: Secondary | ICD-10-CM | POA: Insufficient documentation

## 2016-09-29 DIAGNOSIS — F329 Major depressive disorder, single episode, unspecified: Secondary | ICD-10-CM | POA: Insufficient documentation

## 2016-09-29 DIAGNOSIS — Z8711 Personal history of peptic ulcer disease: Secondary | ICD-10-CM | POA: Diagnosis not present

## 2016-09-29 DIAGNOSIS — G709 Myoneural disorder, unspecified: Secondary | ICD-10-CM | POA: Diagnosis not present

## 2016-09-29 DIAGNOSIS — K319 Disease of stomach and duodenum, unspecified: Secondary | ICD-10-CM | POA: Insufficient documentation

## 2016-09-29 DIAGNOSIS — Z79899 Other long term (current) drug therapy: Secondary | ICD-10-CM | POA: Insufficient documentation

## 2016-09-29 DIAGNOSIS — I10 Essential (primary) hypertension: Secondary | ICD-10-CM | POA: Insufficient documentation

## 2016-09-29 DIAGNOSIS — E119 Type 2 diabetes mellitus without complications: Secondary | ICD-10-CM | POA: Insufficient documentation

## 2016-09-29 DIAGNOSIS — Z955 Presence of coronary angioplasty implant and graft: Secondary | ICD-10-CM | POA: Diagnosis not present

## 2016-09-29 DIAGNOSIS — N289 Disorder of kidney and ureter, unspecified: Secondary | ICD-10-CM | POA: Insufficient documentation

## 2016-09-29 DIAGNOSIS — Z794 Long term (current) use of insulin: Secondary | ICD-10-CM | POA: Diagnosis not present

## 2016-09-29 DIAGNOSIS — D649 Anemia, unspecified: Secondary | ICD-10-CM | POA: Diagnosis not present

## 2016-09-29 DIAGNOSIS — F419 Anxiety disorder, unspecified: Secondary | ICD-10-CM | POA: Insufficient documentation

## 2016-09-29 DIAGNOSIS — M5136 Other intervertebral disc degeneration, lumbar region: Secondary | ICD-10-CM | POA: Diagnosis not present

## 2016-09-29 DIAGNOSIS — G8929 Other chronic pain: Secondary | ICD-10-CM | POA: Insufficient documentation

## 2016-09-29 DIAGNOSIS — R1013 Epigastric pain: Secondary | ICD-10-CM | POA: Diagnosis present

## 2016-09-29 HISTORY — PX: ESOPHAGOGASTRODUODENOSCOPY (EGD) WITH PROPOFOL: SHX5813

## 2016-09-29 LAB — GLUCOSE, CAPILLARY: GLUCOSE-CAPILLARY: 117 mg/dL — AB (ref 65–99)

## 2016-09-29 SURGERY — ESOPHAGOGASTRODUODENOSCOPY (EGD) WITH PROPOFOL
Anesthesia: General

## 2016-09-29 MED ORDER — FENTANYL CITRATE (PF) 100 MCG/2ML IJ SOLN
INTRAMUSCULAR | Status: AC
  Filled 2016-09-29: qty 2

## 2016-09-29 MED ORDER — LIDOCAINE HCL (PF) 2 % IJ SOLN
INTRAMUSCULAR | Status: AC
  Filled 2016-09-29: qty 2

## 2016-09-29 MED ORDER — SODIUM CHLORIDE 0.9 % IV SOLN: INTRAVENOUS | Status: DC

## 2016-09-29 MED ORDER — FENTANYL CITRATE (PF) 100 MCG/2ML IJ SOLN
INTRAMUSCULAR | Status: DC | PRN
  Administered 2016-09-29 (×2): 50 ug via INTRAVENOUS

## 2016-09-29 MED ORDER — SODIUM CHLORIDE 0.9 % IV SOLN
INTRAVENOUS | Status: DC
  Administered 2016-09-29: 08:00:00 via INTRAVENOUS

## 2016-09-29 MED ORDER — EPHEDRINE SULFATE 50 MG/ML IJ SOLN
INTRAMUSCULAR | Status: AC
  Filled 2016-09-29: qty 1

## 2016-09-29 MED ORDER — PHENYLEPHRINE HCL 10 MG/ML IJ SOLN
INTRAMUSCULAR | Status: AC
  Filled 2016-09-29: qty 1

## 2016-09-29 MED ORDER — GLYCOPYRROLATE 0.2 MG/ML IJ SOLN
INTRAMUSCULAR | Status: DC | PRN
  Administered 2016-09-29: 0.2 mg via INTRAVENOUS

## 2016-09-29 MED ORDER — PROPOFOL 500 MG/50ML IV EMUL
INTRAVENOUS | Status: DC | PRN
  Administered 2016-09-29: 100 ug/kg/min via INTRAVENOUS

## 2016-09-29 MED ORDER — PROPOFOL 500 MG/50ML IV EMUL
INTRAVENOUS | Status: AC
  Filled 2016-09-29: qty 50

## 2016-09-29 MED ORDER — LIDOCAINE HCL (PF) 2 % IJ SOLN
INTRAMUSCULAR | Status: DC | PRN
  Administered 2016-09-29: 50 mg

## 2016-09-29 MED ORDER — GLYCOPYRROLATE 0.2 MG/ML IJ SOLN
INTRAMUSCULAR | Status: AC
  Filled 2016-09-29: qty 1

## 2016-09-29 MED ORDER — PROPOFOL 10 MG/ML IV BOLUS
INTRAVENOUS | Status: DC | PRN
  Administered 2016-09-29: 50 mg via INTRAVENOUS

## 2016-09-29 NOTE — Anesthesia Post-op Follow-up Note (Cosign Needed)
Anesthesia QCDR form completed.        

## 2016-09-29 NOTE — Op Note (Signed)
[facility] Patient Name: Todd Banks Procedure Date: 09/29/2016 7:35 AM MRN: [id number] Account #: [id number] Date of Birth: [birth year] Admit Type: Outpatient Age: 66 Room: [facility] ENDO ROOM 1 Gender: Male Note Status: Finalized Procedure:            Upper GI endoscopy Indications:          Epigastric abdominal pain Providers:            Manya Silvas, MD Referring MD:         Irven Easterly. Kary Kos, MD (Referring MD) Medicines:            Propofol per Anesthesia Complications:        No immediate complications. Procedure:            Pre-Anesthesia Assessment:                       - After reviewing the risks and benefits, the patient                        was deemed in satisfactory condition to undergo the                        procedure.                       After obtaining informed consent, the endoscope was                        passed under direct vision. Throughout the procedure,                        the patient's blood pressure, pulse, and oxygen                        saturations were monitored continuously. The Endoscope                        was introduced through the mouth, and advanced to the                        second part of duodenum. The upper GI endoscopy was                        accomplished without difficulty. The patient tolerated                        the procedure well. Findings:      The examined esophagus was normal. GEJ 40cm.      Two localized, 12 mm non-bleeding thin linear erosions were found in the       greater curve gastric body. There were no stigmata of recent bleeding.       Biopsies were taken with a cold forceps for histology. Biopsies were       taken with a cold forceps for Helicobacter pylori testing.      Patchy mildly erythematous mucosa without bleeding was found in the       gastric antrum. Biopsies were taken with a cold forceps for histology.       Biopsies were taken with a cold forceps for  Helicobacter pylori testing.      The examined duodenum was normal.  Impression:           - Normal esophagus.                       - Non-bleeding erosive gastropathy. Biopsied.                       - Erythematous mucosa in the antrum. Biopsied.                       - Normal examined duodenum. Recommendation:       - Await pathology results. Manya Silvas, MD 09/29/2016 7:50:35 AM This report has been signed electronically. Number of Addenda: 0 Note Initiated On: 09/29/2016 7:35 AM      [facility]

## 2016-09-29 NOTE — Transfer of Care (Signed)
Immediate Anesthesia Transfer of Care Note  Patient: Todd Banks  Procedure(s) Performed: Procedure(s): ESOPHAGOGASTRODUODENOSCOPY (EGD) WITH PROPOFOL (N/A)  Patient Location: PACU  Anesthesia Type:General  Level of Consciousness: sedated  Airway & Oxygen Therapy: Patient Spontanous Breathing  Post-op Assessment: Report given to RN  Post vital signs: Reviewed and stable  Last Vitals:  Vitals:   09/29/16 0706  BP: 120/60  Pulse: 62  Resp: 16  Temp: 36.6 C    Last Pain:  Vitals:   09/29/16 0706  TempSrc: Tympanic         Complications: No apparent anesthesia complications

## 2016-09-29 NOTE — H&P (Signed)
Primary Care Physician:  Maryland Pink, MD Primary Gastroenterologist:  Dr. Vira Agar  Pre-Procedure History & Physical: HPI:  Todd Banks is a 66 y.o. male is here for an endoscopy.   Past Medical History:  Diagnosis Date  . Anemia   . Anxiety   . Chronic back pain   . Constipation   . DDD (degenerative disc disease), lumbar   . Depression   . Diabetes mellitus without complication ([place])   . Diabetes mellitus, type II ([place])   . Dizziness   . Heart disease   . Hypertension   . Intermittent self-catheterization of bladder   . Kidney stones   . PUD (peptic ulcer disease)   . Shingles     Past Surgical History:  Procedure Laterality Date  . BACK SURGERY    . CARDIAC CATHETERIZATION N/A 08/28/2015   Procedure: Left Heart Cath and Coronary Angiography;  Surgeon: Isaias Cowman, MD;  Location: [facility];  Service: Cardiovascular;  Laterality: N/A;  . CHOLECYSTECTOMY    . CORONARY STENT PLACEMENT    . HAND SURGERY Right     Prior to Admission medications   Medication Sig Start Date End Date Taking? Authorizing Provider  buPROPion (WELLBUTRIN SR) 150 MG 12 hr tablet Take 1 tablet (150 mg total) by mouth daily. 08/18/16  Yes Rainey Pines, MD  docusate sodium (COLACE) 50 MG capsule Take by mouth.   Yes Historical Provider, MD  escitalopram (LEXAPRO) 20 MG tablet Take 1 tablet (20 mg total) by mouth daily. 08/18/16  Yes Rainey Pines, MD  insulin glargine (LANTUS) 100 UNIT/ML injection Inject 70 Units into the skin at bedtime.    Yes Historical Provider, MD  insulin lispro (HUMALOG) 100 UNIT/ML injection 20 Units 3 (three) times daily before meals. Sliding scale   Yes Historical Provider, MD  isosorbide dinitrate (ISORDIL) 30 MG tablet Take 30 mg by mouth 2 (two) times daily.   Yes Historical Provider, MD  metFORMIN (GLUCOPHAGE) 1000 MG tablet Take 1,000 mg by mouth 2 (two) times daily with a meal.   Yes Historical Provider, MD  methadone (DOLOPHINE) 10 MG tablet Take  by mouth. 11/22/13  Yes Historical Provider, MD  metoprolol succinate (TOPROL-XL) 25 MG 24 hr tablet Take 25 mg by mouth daily.   Yes Historical Provider, MD  omeprazole (PRILOSEC) 20 MG capsule Take 20 mg by mouth 2 (two) times daily before a meal.   Yes Historical Provider, MD  orphenadrine (NORFLEX) 100 MG tablet Limit  1 tab po / day or bid  If tolerated 03/04/16  Yes Mohammed Kindle, MD  oxyCODONE (OXY IR/ROXICODONE) 5 MG immediate release tablet LIMIT 1 TAB BY MOUTH 2 TO 4 TIMES PER DAY FOR BREAKTHROUGH PAIN WHILE TAKING METHADONE IF TOLERATED 12/19/15  Yes Historical Provider, MD  pioglitazone (ACTOS) 15 MG tablet Take by mouth. 09/30/15 09/29/16 Yes Historical Provider, MD  pravastatin (PRAVACHOL) 40 MG tablet Take 40 mg by mouth daily.   Yes Historical Provider, MD  traZODone (DESYREL) 150 MG tablet Take 1 tablet (150 mg total) by mouth at bedtime. 08/18/16  Yes Rainey Pines, MD  aspirin 325 MG tablet Take 325 mg by mouth daily.    Historical Provider, MD  cyanocobalamin (,VITAMIN B-12,) 1000 MCG/ML injection  12/28/15   Historical Provider, MD  gemfibrozil (LOPID) 600 MG tablet Take 600 mg by mouth 2 (two) times daily before a meal.    Historical Provider, MD  glucose blood (ONE TOUCH ULTRA TEST) test strip Use as instructed  5 times daily. 02/16/14   Historical Provider, MD  Insulin Pen Needle (EXEL COMFORT POINT PEN NEEDLE) 29G X 12MM MISC Use one four times daily. 09/30/15   Historical Provider, MD  isosorbide mononitrate (IMDUR) 30 MG 24 hr tablet  02/05/16   Historical Provider, MD  NEEDLE, DISP, 25 G (BD SAFETYGLIDE SHIELDED NEEDLE) 25G X 1" MISC Use 1 each monthly. for B12 injections 06/22/15   Historical Provider, MD  nitroGLYCERIN (NITROSTAT) 0.4 MG SL tablet Place under the tongue. 09/04/15   Historical Provider, MD  prasugrel (EFFIENT) 10 MG TABS tablet Take 10 mg by mouth daily.    Historical Provider, MD  SYRINGE-NEEDLE, DISP, 3 ML (MEDSAVER SYRINGE 3CC/23GX1") 23G X 1" 3 ML MISC Use as directed  with  cyanocobalamin 12/31/15   Historical Provider, MD    Allergies as of 08/18/2016 - Review Complete 08/18/2016  Allergen Reaction Noted  . Cephalexin Hives 10/24/2014  . Cephalosporins Swelling 11/20/2014  . Ampicillin Swelling 10/24/2014  . Cefazolin Swelling 10/24/2014  . Cefuroxime axetil Swelling 11/20/2014  . Ciprofloxacin Swelling 10/24/2014  . Clindamycin  11/20/2014  . Doxycycline Swelling 10/24/2014  . Erythromycin Swelling 10/24/2014  . Latex  10/24/2014  . Levofloxacin Swelling 10/24/2014  . Other Swelling 11/20/2014  . Penicillins Swelling 11/20/2014  . Pork (porcine) protein  10/24/2014  . Prevacid [lansoprazole]  10/24/2014    Family History  Problem Relation Age of Onset  . Diabetes Mother   . Heart disease Mother   . Hypertension Mother   . Depression Father   . Heart disease Father   . Hypertension Father     Social History   Social History  . Marital status: Married    Spouse name: N/A  . Number of children: N/A  . Years of education: N/A   Occupational History  . Not on file.   Social History Main Topics  . Smoking status: Former Smoker    Quit date: 07/22/1993  . Smokeless tobacco: Never Used  . Alcohol use No  . Drug use: No  . Sexual activity: Not Currently   Other Topics Concern  . Not on file   Social History Narrative  . No narrative on file    Review of Systems: See HPI, otherwise negative ROS  Physical Exam: BP 120/60   Pulse 62   Temp 97.9 F (36.6 C) (Tympanic)   Resp 16   Ht 5\' 9"  (1.753 m)   Wt 94.8 kg (209 lb)   SpO2 97%   BMI 30.86 kg/m  General:   Alert,  pleasant and cooperative in NAD Head:  Normocephalic and atraumatic. Neck:  Supple; no masses or thyromegaly. Lungs:  Clear throughout to auscultation.    Heart:  Regular rate and rhythm. Abdomen:  Soft, nontender and nondistended. Normal bowel sounds, without guarding, and without rebound.   Neurologic:  Alert and  oriented x4;  grossly normal  neurologically.  Impression/Plan: Todd Banks is here for an endoscopy to be performed for abdominal pain.  Risks, benefits, limitations, and alternatives regarding  endoscopy have been reviewed with the patient.  Questions have been answered.  All parties agreeable.   Gaylyn Cheers, MD  09/29/2016, 7:32 AM

## 2016-09-29 NOTE — Anesthesia Postprocedure Evaluation (Signed)
Anesthesia Post Note  Patient: Todd Banks  Procedure(s) Performed: Procedure(s) (LRB): ESOPHAGOGASTRODUODENOSCOPY (EGD) WITH PROPOFOL (N/A)  Patient location during evaluation: Endoscopy Anesthesia Type: General Level of consciousness: awake and alert Pain management: pain level controlled Vital Signs Assessment: post-procedure vital signs reviewed and stable Respiratory status: spontaneous breathing, nonlabored ventilation, respiratory function stable and patient connected to nasal cannula oxygen Cardiovascular status: blood pressure returned to baseline and stable Postop Assessment: no signs of nausea or vomiting Anesthetic complications: no     Last Vitals:  Vitals:   09/29/16 0810 09/29/16 0820  BP: (!) 116/59 (!) 105/53  Pulse: 64 (!) 59  Resp: 15 11  Temp:      Last Pain:  Vitals:   09/29/16 0706  TempSrc: Tympanic                 [name] S

## 2016-09-29 NOTE — Anesthesia Preprocedure Evaluation (Signed)
Anesthesia Evaluation  Patient identified by MRN, date of birth, ID band Patient awake    Reviewed: Allergy & Precautions, NPO status , Patient's Chart, lab work & pertinent test results, reviewed documented beta blocker date and time   Airway Mallampati: II  TM Distance: >3 FB     Dental  (+) Chipped   Pulmonary former smoker,           Cardiovascular hypertension, Pt. on medications and Pt. on home beta blockers + Cardiac Stents and + DOE       Neuro/Psych PSYCHIATRIC DISORDERS Anxiety Depression  Neuromuscular disease    GI/Hepatic PUD,   Endo/Other  diabetes, Type 2  Renal/GU Renal disease     Musculoskeletal  (+) Arthritis ,   Abdominal   Peds  Hematology  (+) anemia ,   Anesthesia Other Findings   Reproductive/Obstetrics                             Anesthesia Physical Anesthesia Plan  ASA: III  Anesthesia Plan: General   Post-op Pain Management:    Induction: Intravenous  Airway Management Planned:   Additional Equipment:   Intra-op Plan:   Post-operative Plan:   Informed Consent: I have reviewed the patients History and Physical, chart, labs and discussed the procedure including the risks, benefits and alternatives for the proposed anesthesia with the patient or authorized representative who has indicated his/her understanding and acceptance.     Plan Discussed with: CRNA  Anesthesia Plan Comments:         Anesthesia Quick Evaluation

## 2016-09-30 ENCOUNTER — Encounter: Payer: Self-pay | Admitting: Unknown Physician Specialty

## 2016-10-02 LAB — SURGICAL PATHOLOGY

## 2016-10-06 ENCOUNTER — Ambulatory Visit: Payer: Medicare Other | Admitting: Psychiatry

## 2016-11-10 ENCOUNTER — Ambulatory Visit: Payer: Medicare Other | Admitting: Psychiatry

## 2016-12-03 ENCOUNTER — Ambulatory Visit: Payer: Medicare Other | Admitting: Psychiatry

## 2016-12-17 ENCOUNTER — Ambulatory Visit: Payer: Medicare Other | Admitting: Psychiatry

## 2017-01-05 ENCOUNTER — Ambulatory Visit (INDEPENDENT_AMBULATORY_CARE_PROVIDER_SITE_OTHER): Payer: 59 | Admitting: Psychiatry

## 2017-01-05 ENCOUNTER — Encounter: Payer: Self-pay | Admitting: Psychiatry

## 2017-01-05 VITALS — BP 151/67 | HR 71 | Temp 98.5°F | Wt 212.8 lb

## 2017-01-05 DIAGNOSIS — G894 Chronic pain syndrome: Secondary | ICD-10-CM | POA: Diagnosis not present

## 2017-01-05 DIAGNOSIS — F331 Major depressive disorder, recurrent, moderate: Secondary | ICD-10-CM | POA: Diagnosis not present

## 2017-01-05 MED ORDER — TRAZODONE HCL 150 MG PO TABS: 150.0000 mg | ORAL_TABLET | Freq: Every day | ORAL | 1 refills | Status: DC

## 2017-01-05 MED ORDER — ESCITALOPRAM OXALATE 20 MG PO TABS: 20.0000 mg | ORAL_TABLET | Freq: Every day | ORAL | 1 refills | Status: DC

## 2017-01-05 MED ORDER — BUPROPION HCL ER (SR) 150 MG PO TB12: 150.0000 mg | ORAL_TABLET | Freq: Every day | ORAL | 1 refills | Status: DC

## 2017-01-05 NOTE — Progress Notes (Signed)
Psychiatric MD Progress Note  Patient Identification: Todd Banks MRN:  [id number] Date of Evaluation:  01/05/2017 Referral Source: Dr Toy Care Chief Complaint:   Chief Complaint    Follow-up; Medication Refill     Visit Diagnosis:    ICD-10-CM   1. MDD (major depressive disorder), recurrent episode, moderate (HCC) F33.1   2. Chronic pain syndrome G89.4     History of Present Illness:    Pt  is a 66 year old married male who presented for follow up.Marland Kitchen He reported that he has history of depression and was following with Dr. Toy Care  in [place].Patient reported that he continues to feel tired and has been Becoming more depressed recently as he was diagnosed with some cardiac issues and has been taking more medications. He reported that he also lost sensations after his back surgery and he is unable to have sexual activity with his wife. He reported that he is becoming depressed due to the same. His wife is not complaining but he feels that he does not have the relationship with her. He reported that he also lost some bladder and bowel function. He reported that he has been talking to his primary care physician about the same.   He shouldn't reported that he has been compliant with his medications. He reported that the medications are helping with his depression. His wife works in [employer]. She will leave early in the morning. He reported that he feels depressed occasionally. He currently denied having any suicidal homicidal ideations or plans. He was discussing about his family issues in detail.         Associated Signs/Symptoms: Depression Symptoms:  depressed mood, anxiety, decreased appetite, (Hypo) Manic Symptoms:  Irritable Mood, Labiality of Mood, Anxiety Symptoms:  Excessive Worry, Psychotic Symptoms:  none PTSD Symptoms: Negative NA  Past Psychiatric History:  Admitted to [facility]- past No suicide attempt  Previous Psychotropic Medications: zoloft   Substance Abuse  History in the last 12 months:  No.  Consequences of Substance Abuse: Negative NA  Past Medical History:  Past Medical History:  Diagnosis Date  . Anemia   . Anxiety   . Chronic back pain   . Constipation   . DDD (degenerative disc disease), lumbar   . Depression   . Diabetes mellitus without complication ([place])   . Diabetes mellitus, type II ([place])   . Dizziness   . Heart disease   . Hypertension   . Intermittent self-catheterization of bladder   . Kidney stones   . PUD (peptic ulcer disease)   . Shingles     Past Surgical History:  Procedure Laterality Date  . BACK SURGERY    . CARDIAC CATHETERIZATION N/A 08/28/2015   Procedure: Left Heart Cath and Coronary Angiography;  Surgeon: Isaias Cowman, MD;  Location: [facility];  Service: Cardiovascular;  Laterality: N/A;  . CHOLECYSTECTOMY    . CORONARY STENT PLACEMENT    . ESOPHAGOGASTRODUODENOSCOPY (EGD) WITH PROPOFOL N/A 09/29/2016   Procedure: ESOPHAGOGASTRODUODENOSCOPY (EGD) WITH PROPOFOL;  Surgeon: Manya Silvas, MD;  Location: [facility];  Service: Endoscopy;  Laterality: N/A;  . HAND SURGERY Right     Family Psychiatric History:  Father- alcohol Son, daughter- Depression   Family History:  Family History  Problem Relation Age of Onset  . Diabetes Mother   . Heart disease Mother   . Hypertension Mother   . Depression Father   . Heart disease Father   . Hypertension Father     Social History:  Social History   Social History  . Marital status: Married    Spouse name: N/A  . Number of children: N/A  . Years of education: N/A   Social History Main Topics  . Smoking status: Former Smoker    Quit date: 07/22/1993  . Smokeless tobacco: Never Used  . Alcohol use No  . Drug use: No  . Sexual activity: Not Currently   Other Topics Concern  . None   Social History Narrative  . 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   Married - 77 +   Has 3 girls, 1 boys.   Allergies:   Allergies  Allergen Reactions  . Cephalexin Hives    Other reaction(s): Unknown Other reaction(s): Unknown  . Cephalosporins Swelling    Other Reaction: reacts to Keflex  . Ampicillin Swelling    Other reaction(s): Unknown  . Cefazolin Swelling    Other reaction(s): Other (qualifier value)  . Cefuroxime Axetil Swelling  . Ciprofloxacin Swelling    Other reaction(s): Respiratory distress (finding), [place]  . Clindamycin Swelling  . Doxycycline Swelling    Other reaction(s): Unknown  . Erythromycin Swelling    Other reaction(s): Unknown  . Latex Other (See Comments)    Other reaction(s): BLISTERS  . Levofloxacin Swelling    Other reaction(s): Altered mental status (finding), Itching of Skin  . Other Swelling    All Mycins..  . Penicillins Swelling  . Pork (Porcine) Protein     Other reaction(s): Other (qualifier value)  . Prevacid [Lansoprazole]     Other reaction(s): Unknown Other reaction(s): Unknown    Metabolic Disorder Labs: No results found for: HGBA1C, MPG No results found for: PROLACTIN No results found for: CHOL, TRIG, HDL, CHOLHDL, VLDL, LDLCALC   Current Medications: Current Outpatient Prescriptions  Medication Sig Dispense Refill  . aspirin 325 MG tablet Take 325 mg by mouth daily.    Marland Kitchen buPROPion (WELLBUTRIN SR) 150 MG 12 hr tablet Take 1 tablet (150 mg total) by mouth daily. 90 tablet 1  . cyanocobalamin (,VITAMIN B-12,) 1000 MCG/ML injection     . docusate sodium (COLACE) 50 MG capsule Take by mouth.    . escitalopram (LEXAPRO) 20 MG tablet Take 1 tablet (20 mg total) by mouth daily. 90 tablet 1  . gemfibrozil (LOPID) 600 MG tablet Take 600 mg by mouth 2 (two) times daily before a meal.    . glucose blood (ONE TOUCH ULTRA TEST) test strip Use as instructed 5 times daily.    . insulin glargine (LANTUS) 100 UNIT/ML injection Inject 70 Units into the skin at bedtime.     . insulin lispro (HUMALOG) 100  UNIT/ML injection 20 Units 3 (three) times daily before meals. Sliding scale    . Insulin Pen Needle (EXEL COMFORT POINT PEN NEEDLE) 29G X 12MM MISC Use one four times daily.    . isosorbide dinitrate (ISORDIL) 30 MG tablet Take 30 mg by mouth 2 (two) times daily.    . isosorbide mononitrate (IMDUR) 30 MG 24 hr tablet     . metFORMIN (GLUCOPHAGE) 1000 MG tablet Take 1,000 mg by mouth 2 (two) times daily with a meal.    . methadone (DOLOPHINE) 10 MG tablet Take by mouth.    . metoprolol succinate (TOPROL-XL) 25 MG 24 hr tablet Take 25 mg by mouth daily.    Marland Kitchen NEEDLE, DISP, 25 G (BD SAFETYGLIDE SHIELDED NEEDLE) 25G X 1" MISC Use 1 each monthly.  for B12 injections    . nitroGLYCERIN (NITROSTAT) 0.4 MG SL tablet Place under the tongue.    Marland Kitchen omeprazole (PRILOSEC) 20 MG capsule Take 20 mg by mouth 2 (two) times daily before a meal.    . orphenadrine (NORFLEX) 100 MG tablet Limit  1 tab po / day or bid  If tolerated 60 tablet 2  . oxyCODONE (OXY IR/ROXICODONE) 5 MG immediate release tablet LIMIT 1 TAB BY MOUTH 2 TO 4 TIMES PER DAY FOR BREAKTHROUGH PAIN WHILE TAKING METHADONE IF TOLERATED  0  . prasugrel (EFFIENT) 10 MG TABS tablet Take 10 mg by mouth daily.    . pravastatin (PRAVACHOL) 40 MG tablet Take 40 mg by mouth daily.    Marland Kitchen RANEXA 500 MG 12 hr tablet Take 500 mg by mouth 2 (two) times daily.  0  . SYRINGE-NEEDLE, DISP, 3 ML (MEDSAVER SYRINGE 3CC/23GX1") 23G X 1" 3 ML MISC Use as directed with  cyanocobalamin    . traZODone (DESYREL) 150 MG tablet Take 1 tablet (150 mg total) by mouth at bedtime. 90 tablet 1  . pioglitazone (ACTOS) 15 MG tablet Take by mouth.     No current facility-administered medications for this visit.     Neurologic: Headache: No Seizure: No Paresthesias:No  Musculoskeletal: Strength & Muscle Tone: within normal limits Gait & Station: normal Patient leans: N/A  Psychiatric Specialty Exam: ROS  Blood pressure (!) 151/67, pulse 71, temperature 98.5 F (36.9 C),  temperature source Oral, weight 212 lb 12.8 oz (96.5 kg).Body mass index is 31.43 kg/m.  General Appearance: Casual  Eye Contact:  Fair  Speech:  Clear and Coherent  Volume:  Normal  Mood:  Anxious and Depressed  Affect:  Congruent  Thought Process:  Coherent  Orientation:  Full (Time, Place, and Person)  Thought Content:  Logical  Suicidal Thoughts:  No  Homicidal Thoughts:  No  Memory:  Immediate;   Fair Recent;   Fair Remote;   Fair  Judgement:  Fair  Insight:  Fair  Psychomotor Activity:  Normal  Concentration:  Concentration: Fair and Attention Span: Fair  Recall:  [employer] of Knowledge:Fair  Language: Fair  Akathisia:  No  Handed:  Right  AIMS (if indicated):    Assets:  Communication Skills Desire for Improvement Social Support  ADL's:  Intact  Cognition: WNL  Sleep:      Treatment Plan Summary: Medication management  Discussed with patient about his medications. He will continue on Wellbutrin XL 150 mg in the morning. Will take Lexapro 20 mg at noon. Pt  taking trazodone 150-75 mg at bedtime to help with the sleep.   Follow-up in 2 month or earlier depending on his symptoms   More than 50% of the time spent in psychoeducation, counseling and coordination of care.    This note was generated in part or whole with voice recognition software. Voice regonition is usually quite accurate but there are transcription errors that can and very often do occur. I apologize for any typographical errors that were not detected and corrected.   Rainey Pines, MD 6/25/20184:30 PM

## 2017-01-22 ENCOUNTER — Other Ambulatory Visit: Payer: Self-pay | Admitting: Physician Assistant

## 2017-01-22 DIAGNOSIS — M5441 Lumbago with sciatica, right side: Principal | ICD-10-CM

## 2017-01-22 DIAGNOSIS — G8929 Other chronic pain: Secondary | ICD-10-CM

## 2017-01-22 DIAGNOSIS — M5442 Lumbago with sciatica, left side: Principal | ICD-10-CM

## 2017-02-03 ENCOUNTER — Ambulatory Visit
Admission: RE | Admit: 2017-02-03 | Discharge: 2017-02-03 | Disposition: A | Discharge status: Home / Self Care | Payer: 59 | Source: Ambulatory Visit | Attending: Physician Assistant | Admitting: Physician Assistant

## 2017-02-03 ENCOUNTER — Other Ambulatory Visit
Admission: RE | Admit: 2017-02-03 | Discharge: 2017-02-03 | Disposition: A | Discharge status: Home / Self Care | Payer: 59 | Source: Ambulatory Visit | Attending: Physician Assistant | Admitting: Physician Assistant

## 2017-02-03 DIAGNOSIS — G8929 Other chronic pain: Secondary | ICD-10-CM

## 2017-02-03 DIAGNOSIS — Z01812 Encounter for preprocedural laboratory examination: Secondary | ICD-10-CM | POA: Diagnosis present

## 2017-02-03 DIAGNOSIS — Z9889 Other specified postprocedural states: Secondary | ICD-10-CM | POA: Diagnosis not present

## 2017-02-03 DIAGNOSIS — M5442 Lumbago with sciatica, left side: Secondary | ICD-10-CM | POA: Diagnosis present

## 2017-02-03 DIAGNOSIS — M5441 Lumbago with sciatica, right side: Secondary | ICD-10-CM | POA: Diagnosis present

## 2017-02-03 DIAGNOSIS — M48061 Spinal stenosis, lumbar region without neurogenic claudication: Secondary | ICD-10-CM | POA: Diagnosis not present

## 2017-02-03 DIAGNOSIS — M5126 Other intervertebral disc displacement, lumbar region: Secondary | ICD-10-CM | POA: Insufficient documentation

## 2017-02-03 LAB — CREATININE, SERUM
CREATININE: 1.52 mg/dL — AB (ref 0.61–1.24)
GFR calc Af Amer: 54 mL/min — ABNORMAL LOW (ref >60)
GFR, EST NON AFRICAN AMERICAN: 46 mL/min — AB (ref >60)

## 2017-02-03 MED ORDER — GADOBENATE DIMEGLUMINE 529 MG/ML IV SOLN
20.0000 mL | Freq: Once | INTRAVENOUS | Status: AC | PRN
  Administered 2017-02-03: 19 mL via INTRAVENOUS

## 2017-02-09 ENCOUNTER — Encounter: Payer: Self-pay | Admitting: Emergency Medicine

## 2017-02-09 ENCOUNTER — Emergency Department
Admission: EM | Admit: 2017-02-09 | Discharge: 2017-02-09 | Disposition: A | Discharge status: Home / Self Care | Payer: 59 | Attending: Emergency Medicine | Admitting: Emergency Medicine

## 2017-02-09 DIAGNOSIS — E119 Type 2 diabetes mellitus without complications: Secondary | ICD-10-CM | POA: Insufficient documentation

## 2017-02-09 DIAGNOSIS — Z7982 Long term (current) use of aspirin: Secondary | ICD-10-CM | POA: Insufficient documentation

## 2017-02-09 DIAGNOSIS — Z87891 Personal history of nicotine dependence: Secondary | ICD-10-CM | POA: Diagnosis not present

## 2017-02-09 DIAGNOSIS — Z79899 Other long term (current) drug therapy: Secondary | ICD-10-CM | POA: Diagnosis not present

## 2017-02-09 DIAGNOSIS — Z794 Long term (current) use of insulin: Secondary | ICD-10-CM | POA: Diagnosis not present

## 2017-02-09 DIAGNOSIS — Z91040 Latex allergy status: Secondary | ICD-10-CM | POA: Diagnosis not present

## 2017-02-09 DIAGNOSIS — I10 Essential (primary) hypertension: Secondary | ICD-10-CM | POA: Insufficient documentation

## 2017-02-09 DIAGNOSIS — Z7902 Long term (current) use of antithrombotics/antiplatelets: Secondary | ICD-10-CM | POA: Insufficient documentation

## 2017-02-09 DIAGNOSIS — N368 Other specified disorders of urethra: Secondary | ICD-10-CM | POA: Diagnosis not present

## 2017-02-09 DIAGNOSIS — N4889 Other specified disorders of penis: Secondary | ICD-10-CM | POA: Diagnosis present

## 2017-02-09 LAB — BASIC METABOLIC PANEL
ANION GAP: 10 (ref 5–15)
BUN: 25 mg/dL — ABNORMAL HIGH (ref 6–20)
CO2: 26 mmol/L (ref 22–32)
CREATININE: 1.05 mg/dL (ref 0.61–1.24)
Calcium: 9.7 mg/dL (ref 8.9–10.3)
Chloride: 100 mmol/L — ABNORMAL LOW (ref 101–111)
GFR calc non Af Amer: >60 mL/min (ref >60)
Glucose, Bld: 166 mg/dL — ABNORMAL HIGH (ref 65–99)
Potassium: 4.9 mmol/L (ref 3.5–5.1)
Sodium: 136 mmol/L (ref 135–145)

## 2017-02-09 LAB — URINALYSIS, COMPLETE (UACMP) WITH MICROSCOPIC
BACTERIA UA: NONE SEEN
BILIRUBIN URINE: NEGATIVE
GLUCOSE, UA: NEGATIVE mg/dL
KETONES UR: NEGATIVE mg/dL
LEUKOCYTES UA: NEGATIVE
NITRITE: NEGATIVE
PROTEIN: NEGATIVE mg/dL
Specific Gravity, Urine: 1.023 (ref 1.005–1.030)
Squamous Epithelial / HPF: NONE SEEN
pH: 5 (ref 5.0–8.0)

## 2017-02-09 LAB — CBC
HCT: 40.9 % (ref 40.0–52.0)
HEMOGLOBIN: 13.9 g/dL (ref 13.0–18.0)
MCH: 29.9 pg (ref 26.0–34.0)
MCHC: 34 g/dL (ref 32.0–36.0)
MCV: 88.1 fL (ref 80.0–100.0)
Platelets: 270 10*3/uL (ref 150–440)
RBC: 4.64 MIL/uL (ref 4.40–5.90)
RDW: 13 % (ref 11.5–14.5)
WBC: 6.9 10*3/uL (ref 3.8–10.6)

## 2017-02-09 NOTE — Discharge Instructions (Signed)
Although no certain cause was found, we discussed he will probably bleeding from some sort of a slight injury from self-catheterization on blood thinners. Please stop the thinner Effient as well as aspirin for about 3 days. If you've not had any bleeding in 3 days you may restart.  Return to the emergency room immediately for any new, worsening, uncontrolled bleeding, any abdominal pain, fever, chest pain, or any other symptoms concerning to you.

## 2017-02-09 NOTE — ED Notes (Signed)
Blue top sent if needed 

## 2017-02-09 NOTE — ED Provider Notes (Signed)
[facility] Emergency Department Provider Note ____________________________________________   I have reviewed the triage vital signs and the triage nursing note.  HISTORY  Chief Complaint Penis bleeding, self caths   Historian Patient  HPI Todd Banks is a 66 y.o. male with a history of intermittent self-catheterization, as well as on Effient and aspirin for history of heart disease, presents after self cathetering this afternoon with bright red blood from the penis tip. He states he was frank blood. There is no pain associated with it. He initially tried to hold pressure and then when he let go additional blood squirted from the penis. Eventually did stop.  He's having no abdominal pain. No nausea or vomiting. He's never had this before.  States he has not seen his urologist in quite some time, and he is able to self catheter whenever he needed several times per day.      Past Medical History:  Diagnosis Date  . Anemia   . Anxiety   . Chronic back pain   . Constipation   . DDD (degenerative disc disease), lumbar   . Depression   . Diabetes mellitus without complication ([place])   . Diabetes mellitus, type II ([place])   . Dizziness   . Heart disease   . Hypertension   . Intermittent self-catheterization of bladder   . Kidney stones   . PUD (peptic ulcer disease)   . Shingles     Patient Active Problem List   Diagnosis Date Noted  . Obesity (BMI 30.0-34.9) 04/16/2016  . S/P cardiac catheterization 09/04/2015  . Uncontrolled type 2 diabetes mellitus with hyperglycemia, with long-term current use of insulin ([place]) 03/04/2015  . Neuropathy due to secondary diabetes ([place]) 12/17/2014  . DDD (degenerative disc disease), lumbosacral 11/19/2014  .  lumbar 11/19/2014  . DJD (degenerative joint disease)shoulder 11/19/2014  . Lumbar post-laminectomy syndrome 11/19/2014  . Chest pain on exertion 02/15/2014  . Anemia 01/09/2014  . Chronic epigastric pain  01/09/2014  . Constipation, chronic 01/09/2014  . DOE (dyspnea on exertion) 01/09/2014  . Elevated alkaline phosphatase level 01/09/2014  . Occult blood positive stool 01/09/2014  . Bulging lumbar disc 11/11/2013  . Hyperlipemia 11/11/2013  . HTN (hypertension) 11/11/2013  . H/O cardiac catheterization 11/05/1995  . History of PTCA 01/03/1993    Past Surgical History:  Procedure Laterality Date  . BACK SURGERY    . CARDIAC CATHETERIZATION N/A 08/28/2015   Procedure: Left Heart Cath and Coronary Angiography;  Surgeon: Isaias Cowman, MD;  Location: [place] [facility];  Service: Cardiovascular;  Laterality: N/A;  . CHOLECYSTECTOMY    . CORONARY STENT PLACEMENT    . ESOPHAGOGASTRODUODENOSCOPY (EGD) WITH PROPOFOL N/A 09/29/2016   Procedure: ESOPHAGOGASTRODUODENOSCOPY (EGD) WITH PROPOFOL;  Surgeon: Manya Silvas, MD;  Location: [facility] ENDOSCOPY;  Service: Endoscopy;  Laterality: N/A;  . HAND SURGERY Right     Prior to Admission medications   Medication Sig Start Date End Date Taking? Authorizing Provider  aspirin 325 MG tablet Take 325 mg by mouth daily.    [provider]  buPROPion (WELLBUTRIN SR) 150 MG 12 hr tablet Take 1 tablet (150 mg total) by mouth daily. 01/05/17   Rainey Pines, MD  cyanocobalamin (,VITAMIN B-12,) 1000 MCG/ML injection  12/28/15   [provider]  docusate sodium (COLACE) 50 MG capsule Take by mouth.    [provider]  escitalopram (LEXAPRO) 20 MG tablet Take 1 tablet (20 mg total) by mouth daily. 01/05/17   Rainey Pines, MD  gemfibrozil (LOPID) 600 MG tablet Take 600 mg by mouth 2 (two) times daily before a meal.    [provider]  glucose blood (ONE TOUCH ULTRA TEST) test strip Use as instructed 5 times daily. 02/16/14   [provider]  insulin glargine (LANTUS) 100 UNIT/ML injection Inject 70 Units into the skin at bedtime.     [provider]  insulin lispro (HUMALOG) 100 UNIT/ML injection 20 Units 3  (three) times daily before meals. Sliding scale    [provider]  Insulin Pen Needle (EXEL COMFORT POINT PEN NEEDLE) 29G X 12MM MISC Use one four times daily. 09/30/15   [provider]  isosorbide dinitrate (ISORDIL) 30 MG tablet Take 30 mg by mouth 2 (two) times daily.    [provider]  isosorbide mononitrate (IMDUR) 30 MG 24 hr tablet  02/05/16   [provider]  metFORMIN (GLUCOPHAGE) 1000 MG tablet Take 1,000 mg by mouth 2 (two) times daily with a meal.    [provider]  methadone (DOLOPHINE) 10 MG tablet Take by mouth. 11/22/13   [provider]  metoprolol succinate (TOPROL-XL) 25 MG 24 hr tablet Take 25 mg by mouth daily.    [provider]  NEEDLE, DISP, 25 G (BD SAFETYGLIDE SHIELDED NEEDLE) 25G X 1" MISC Use 1 each monthly. for B12 injections 06/22/15   [provider]  nitroGLYCERIN (NITROSTAT) 0.4 MG SL tablet Place under the tongue. 09/04/15   [provider]  omeprazole (PRILOSEC) 20 MG capsule Take 20 mg by mouth 2 (two) times daily before a meal.    [provider]  orphenadrine (NORFLEX) 100 MG tablet Limit  1 tab po / day or bid  If tolerated 03/04/16   Mohammed Kindle, MD  oxyCODONE (OXY IR/ROXICODONE) 5 MG immediate release tablet LIMIT 1 TAB BY MOUTH 2 TO 4 TIMES PER DAY FOR BREAKTHROUGH PAIN WHILE TAKING METHADONE IF TOLERATED 12/19/15   [provider]  pioglitazone (ACTOS) 15 MG tablet Take by mouth. 09/30/15 09/29/16  [provider]  prasugrel (EFFIENT) 10 MG TABS tablet Take 10 mg by mouth daily.    [provider]  pravastatin (PRAVACHOL) 40 MG tablet Take 40 mg by mouth daily.    [provider]  RANEXA 500 MG 12 hr tablet Take 500 mg by mouth 2 (two) times daily. 12/18/16   [provider]  SYRINGE-NEEDLE, DISP, 3 ML (MEDSAVER SYRINGE 3CC/23GX1") 23G X 1" 3 ML MISC Use as directed with  cyanocobalamin 12/31/15   [provider]   traZODone (DESYREL) 150 MG tablet Take 1 tablet (150 mg total) by mouth at bedtime. 01/05/17   Rainey Pines, MD    Allergies  Allergen Reactions  . Cephalexin Hives    Other reaction(s): Unknown Other reaction(s): Unknown  . Cephalosporins Swelling    Other Reaction: reacts to Keflex  . Ampicillin Swelling    Other reaction(s): Unknown  . Cefazolin Swelling    Other reaction(s): Other (qualifier value)  . Cefuroxime Axetil Swelling  . Ciprofloxacin Swelling    Other reaction(s): Respiratory distress (finding), Weal  . Clindamycin Swelling  . Doxycycline Swelling    Other reaction(s): Unknown  . Erythromycin Swelling    Other reaction(s): Unknown  . Latex Other (See Comments)    Other reaction(s): BLISTERS  . Levofloxacin Swelling    Other reaction(s): Altered mental status (finding), Itching of Skin  . Other Swelling    All Mycins..  . Penicillins Swelling  . Pork (  Porcine) Protein     Other reaction(s): Other (qualifier value)  . Prevacid [Lansoprazole]     Other reaction(s): Unknown Other reaction(s): Unknown    Family History  Problem Relation Age of Onset  . Diabetes Mother   . Heart disease Mother   . Hypertension Mother   . Depression Father   . Heart disease Father   . Hypertension Father     Social History Social History  Substance Use Topics  . Smoking status: Former Smoker    Quit date: 07/22/1993  . Smokeless tobacco: Never Used  . Alcohol use No    Review of Systems  Constitutional: Negative for fever. Eyes: Negative for visual changes. ENT: Negative for sore throat. Cardiovascular: Negative for chest pain. Respiratory: Negative for shortness of breath. Gastrointestinal: Negative for abdominal pain, vomiting and diarrhea. Genitourinary: Negative for dysuria. Musculoskeletal: Negative for back pain. Skin: Negative for rash. Neurological: Negative for headache.  ____________________________________________   PHYSICAL EXAM:  VITAL  SIGNS: ED Triage Vitals [02/09/17 1426]  Enc Vitals Group     BP (!) 163/103     Pulse Rate 89     Resp 18     Temp 98 F (36.7 C)     Temp Source Oral     SpO2 98 %     Weight 198 lb (89.8 kg)     Height 5\' 8"  (1.727 m)     Head Circumference      Peak Flow      Pain Score      Pain Loc      Pain Edu?      Excl. in [place]?      Constitutional: Alert and oriented. Well appearing and in no distress. HEENT   Head: Normocephalic and atraumatic.      Eyes: Conjunctivae are normal. Pupils equal and round.       Ears:         Nose: No congestion/rhinnorhea.   Mouth/Throat: Mucous membranes are moist.   Neck: No stridor. Cardiovascular/Chest: Normal rate, regular rhythm.  No murmurs, rubs, or gallops. Respiratory: Normal respiratory effort without tachypnea nor retractions. Breath sounds are clear and equal bilaterally. No wheezes/rales/rhonchi. Gastrointestinal: Soft. No distention, no guarding, no rebound. Nontender.    Genitourinary/rectal: Musculoskeletal: Nontender with normal range of motion in all extremities. No joint effusions.  No lower extremity tenderness.  No edema. Neurologic:  Normal speech and language. No gross or focal neurologic deficits are appreciated. Skin:  Skin is warm, dry and intact. No rash noted. Psychiatric: Mood and affect are normal. Speech and behavior are normal. Patient exhibits appropriate insight and judgment.   ____________________________________________  LABS (pertinent positives/negatives)  Labs Reviewed  BASIC METABOLIC PANEL - Abnormal; Notable for the following:       Result Value   Chloride 100 (*)    Glucose, Bld 166 (*)    BUN 25 (*)    All other components within normal limits  URINALYSIS, COMPLETE (UACMP) WITH MICROSCOPIC - Abnormal; Notable for the following:    Color, Urine YELLOW (*)    APPearance CLEAR (*)    Hgb urine dipstick MODERATE (*)    All other components within normal limits  CBC     ____________________________________________    EKG I, Lisa Roca, MD, the attending physician have personally viewed and interpreted all ECGs.  None ____________________________________________  RADIOLOGY All Xrays were viewed by me. Imaging interpreted by Radiologist.  None __________________________________________  PROCEDURES  Procedure(s) performed: None  Critical Care performed:  None  ____________________________________________   ED COURSE / ASSESSMENT AND PLAN  Pertinent labs & imaging results that were available during my care of the patient were reviewed by me and considered in my medical decision making (see chart for details).   Mr. Ranganathan evaluation for blood from the urethra. A motorist is a reassuring as well as clinical exam and vital signs.  No acute anemia. Does not sound like he is having intra-abdominal symptoms. He is on a blood thinner, suspect possible traumatic bleed which was under control by the time he came here.  He does self catheter and states that he feels like he has to go and so he was able to catheter here a urine sample.  We discussed risks versus benefit in terms of being off the blood thinners for 3 days while allowing the body to heal from R site was irritated/bleeding.  We discussed following up with primary care doctor, as well as his urologist.  Next step might include bladder/urethral scope.  And discussed with the patient, chose to place a Foley catheter to minimize the amount of trauma from intermittent catheterization for couple of days.   CONSULTATIONS:   None Patient / Family / Caregiver informed of clinical course, medical decision-making process, and agree with plan.   I discussed return precautions, follow-up instructions, and discharge instructions with patient and/or family.  Discharge Instructions : Although no certain cause was found, we discussed he will probably bleeding from some sort of a slight injury  from self-catheterization on blood thinners. Please stop the thinner Effient as well as aspirin for about 3 days. If you've not had any bleeding in 3 days you may restart.  Return to the emergency room immediately for any new, worsening, uncontrolled bleeding, any abdominal pain, fever, chest pain, or any other symptoms concerning to you.  ___________________________________________   FINAL CLINICAL IMPRESSION(S) / ED DIAGNOSES   Final diagnoses:  Urethral bleeding              Note: This dictation was prepared with Dragon dictation. Any transcriptional errors that result from this process are unintentional    Lisa Roca, MD 02/09/17 [id number]

## 2017-02-09 NOTE — ED Notes (Signed)
Pt able to provide urine sample.  Sample sent to lab.  Pt states he is hungry.  EDP okay with pt eating.  Daughter went to cafeteria to get patient food.

## 2017-02-09 NOTE — ED Notes (Signed)
Pt provided with education on leg bag and indwelling foley catheter.

## 2017-02-09 NOTE — ED Triage Notes (Signed)
Pt reports he self caths. Pt states today his penis began to pour blood during the cath. Bleeding currently controlled. No blood noted to tip of penis until pt squeezed and then blood began to trickle. Denies pain.

## 2017-02-11 ENCOUNTER — Other Ambulatory Visit: Payer: Self-pay | Admitting: Psychiatry

## 2017-02-16 ENCOUNTER — Other Ambulatory Visit: Payer: Self-pay | Admitting: Neurosurgery

## 2017-02-16 DIAGNOSIS — M5416 Radiculopathy, lumbar region: Secondary | ICD-10-CM

## 2017-02-16 DIAGNOSIS — R292 Abnormal reflex: Secondary | ICD-10-CM

## 2017-02-18 ENCOUNTER — Other Ambulatory Visit: Payer: Self-pay | Admitting: Student

## 2017-02-18 DIAGNOSIS — M48061 Spinal stenosis, lumbar region without neurogenic claudication: Secondary | ICD-10-CM

## 2017-02-18 DIAGNOSIS — M5416 Radiculopathy, lumbar region: Secondary | ICD-10-CM

## 2017-02-24 ENCOUNTER — Ambulatory Visit: Payer: Self-pay | Admitting: Urology

## 2017-03-05 ENCOUNTER — Encounter: Payer: Self-pay | Admitting: Urology

## 2017-03-05 ENCOUNTER — Ambulatory Visit (INDEPENDENT_AMBULATORY_CARE_PROVIDER_SITE_OTHER): Payer: 59 | Admitting: Urology

## 2017-03-05 VITALS — BP 107/56 | HR 73 | Ht 68.0 in | Wt 204.3 lb

## 2017-03-05 DIAGNOSIS — N529 Male erectile dysfunction, unspecified: Secondary | ICD-10-CM

## 2017-03-05 DIAGNOSIS — N319 Neuromuscular dysfunction of bladder, unspecified: Secondary | ICD-10-CM | POA: Diagnosis not present

## 2017-03-05 DIAGNOSIS — R351 Nocturia: Secondary | ICD-10-CM | POA: Diagnosis not present

## 2017-03-05 DIAGNOSIS — N401 Enlarged prostate with lower urinary tract symptoms: Secondary | ICD-10-CM | POA: Diagnosis not present

## 2017-03-05 DIAGNOSIS — Z125 Encounter for screening for malignant neoplasm of prostate: Secondary | ICD-10-CM | POA: Diagnosis not present

## 2017-03-05 DIAGNOSIS — R310 Gross hematuria: Secondary | ICD-10-CM

## 2017-03-05 DIAGNOSIS — K625 Hemorrhage of anus and rectum: Secondary | ICD-10-CM | POA: Diagnosis not present

## 2017-03-05 DIAGNOSIS — N138 Other obstructive and reflux uropathy: Secondary | ICD-10-CM | POA: Diagnosis not present

## 2017-03-05 NOTE — Progress Notes (Signed)
03/05/2017 8:23 AM   Todd Banks March 20, 1951 [id number]  Referring provider: Maryland Pink, MD [address] [facility] [place], [place] [address]  Chief Complaint  Patient presents with  . New Patient (Initial Visit)    HPI: Patient is a 66 year old Caucasian male who experienced gross hematuria after self cathing referred by [facility] ED.    He states that he has been managing a neurogenic bladder with CIC after a back surgery ~ 18 years ago.  He had borrowed his son's father-in-laws straight cath, which was a Coude, and he experienced gross hematuria.  He usually uses a straight cath for CIC.  The hematuria continued and he became concerned.  It was then he went to the ED.  The bleeding stopped once he was seen in the ED.    He is cathing himself 10 times in a 24 hour period.  He is cathing 4 times nightly.  He has not been seen by urology in quite some time.    He also states he has not seen his PCP is quite some time.    He has also been experiencing ED for the last several years.  He is no longer having spontaneous erections.  He is not having penile curvature or penile pain.    He also mentioned that he has been seeing dark blood from his rectum for the last month.    PMH: Past Medical History:  Diagnosis Date  . Anemia   . Anxiety   . Chronic back pain   . Constipation   . DDD (degenerative disc disease), lumbar   . Depression   . Diabetes mellitus without complication ([place])   . Diabetes mellitus, type II ([place])   . Dizziness   . Heart disease   . Hypertension   . Intermittent self-catheterization of bladder   . Kidney stones   . PUD (peptic ulcer disease)   . Shingles     Surgical History: Past Surgical History:  Procedure Laterality Date  . BACK SURGERY    . CARDIAC CATHETERIZATION N/A 08/28/2015   Procedure: Left Heart Cath and Coronary Angiography;  Surgeon: Isaias Cowman, MD;  Location: [place] [facility];  Service: Cardiovascular;   Laterality: N/A;  . CHOLECYSTECTOMY    . CORONARY STENT PLACEMENT    . ESOPHAGOGASTRODUODENOSCOPY (EGD) WITH PROPOFOL N/A 09/29/2016   Procedure: ESOPHAGOGASTRODUODENOSCOPY (EGD) WITH PROPOFOL;  Surgeon: Manya Silvas, MD;  Location: [facility] ENDOSCOPY;  Service: Endoscopy;  Laterality: N/A;  . HAND SURGERY Right     Home Medications:  Allergies as of 03/05/2017      Reactions   Cephalexin Hives   Other reaction(s): Unknown Other reaction(s): Unknown   Cephalosporins Swelling   Other Reaction: reacts to Keflex   Ampicillin Swelling   Other reaction(s): Unknown   Cefazolin Swelling   Other reaction(s): Other (qualifier value)   Cefuroxime Axetil Swelling   Ciprofloxacin Swelling   Other reaction(s): Respiratory distress (finding), Weal   Clindamycin Swelling   Doxycycline Swelling   Other reaction(s): Unknown   Erythromycin Swelling   Other reaction(s): Unknown   Latex Other (See Comments)   Other reaction(s): BLISTERS   Levofloxacin Swelling   Other reaction(s): Altered mental status (finding), Itching of Skin   Other Swelling   All Mycins..   Penicillins Swelling   Pork (porcine) Protein    Other reaction(s): Other (qualifier value)   Prevacid [lansoprazole]    Other reaction(s): Unknown Other reaction(s): Unknown  Medication List       Accurate as of 03/05/17 11:59 PM. Always use your most recent med list.          aspirin 325 MG tablet Take 325 mg by mouth daily.   BD SAFETYGLIDE SHIELDED NEEDLE 25G X 1" Misc Generic drug:  NEEDLE (DISP) 25 G Use 1 each monthly. for B12 injections   buPROPion 150 MG 12 hr tablet Commonly known as:  WELLBUTRIN SR Take 1 tablet (150 mg total) by mouth daily.   cyanocobalamin 1000 MCG/ML injection Commonly known as:  (VITAMIN B-12)   docusate sodium 50 MG capsule Commonly known as:  COLACE Take by mouth.   escitalopram 20 MG tablet Commonly known as:  LEXAPRO Take 1 tablet (20 mg total) by mouth daily.   EXEL  COMFORT POINT PEN NEEDLE 29G X 12MM Misc Generic drug:  Insulin Pen Needle Use one four times daily.   gemfibrozil 600 MG tablet Commonly known as:  LOPID Take 600 mg by mouth 2 (two) times daily before a meal.   insulin glargine 100 UNIT/ML injection Commonly known as:  LANTUS Inject 70 Units into the skin at bedtime.   insulin lispro 100 UNIT/ML injection Commonly known as:  HUMALOG 20 Units 3 (three) times daily before meals. Sliding scale   isosorbide dinitrate 30 MG tablet Commonly known as:  ISORDIL Take 30 mg by mouth 2 (two) times daily.   isosorbide mononitrate 30 MG 24 hr tablet Commonly known as:  IMDUR   MEDSAVER SYRINGE 3CC/23GX1" 23G X 1" 3 ML Misc Generic drug:  SYRINGE-NEEDLE (DISP) 3 ML Use as directed with  cyanocobalamin   metFORMIN 1000 MG tablet Commonly known as:  GLUCOPHAGE Take 1,000 mg by mouth 2 (two) times daily with a meal.   methadone 10 MG tablet Commonly known as:  DOLOPHINE Take by mouth.   metoprolol succinate 25 MG 24 hr tablet Commonly known as:  TOPROL-XL Take 25 mg by mouth daily.   nitroGLYCERIN 0.4 MG SL tablet Commonly known as:  NITROSTAT Place under the tongue.   omeprazole 20 MG capsule Commonly known as:  PRILOSEC Take 20 mg by mouth 2 (two) times daily before a meal.   ONE TOUCH ULTRA TEST test strip Generic drug:  glucose blood Use as instructed 5 times daily.   orphenadrine 100 MG tablet Commonly known as:  NORFLEX Limit  1 tab po / day or bid  If tolerated   oxyCODONE 5 MG immediate release tablet Commonly known as:  Oxy IR/ROXICODONE LIMIT 1 TAB BY MOUTH 2 TO 4 TIMES PER DAY FOR BREAKTHROUGH PAIN WHILE TAKING METHADONE IF TOLERATED   pioglitazone 15 MG tablet Commonly known as:  ACTOS Take by mouth.   prasugrel 10 MG Tabs tablet Commonly known as:  EFFIENT Take 10 mg by mouth daily.   pravastatin 40 MG tablet Commonly known as:  PRAVACHOL Take 40 mg by mouth daily.   RANEXA 500 MG 12 hr  tablet Generic drug:  ranolazine Take 500 mg by mouth 2 (two) times daily.   traZODone 150 MG tablet Commonly known as:  DESYREL Take 1 tablet (150 mg total) by mouth at bedtime.            Discharge Care Instructions        Start     Ordered   03/05/17 0000  PSA     03/05/17 1348   03/05/17 0000  Testosterone     03/05/17 1348  Allergies:  Allergies  Allergen Reactions  . Cephalexin Hives    Other reaction(s): Unknown Other reaction(s): Unknown  . Cephalosporins Swelling    Other Reaction: reacts to Keflex  . Ampicillin Swelling    Other reaction(s): Unknown  . Cefazolin Swelling    Other reaction(s): Other (qualifier value)  . Cefuroxime Axetil Swelling  . Ciprofloxacin Swelling    Other reaction(s): Respiratory distress (finding), Weal  . Clindamycin Swelling  . Doxycycline Swelling    Other reaction(s): Unknown  . Erythromycin Swelling    Other reaction(s): Unknown  . Latex Other (See Comments)    Other reaction(s): BLISTERS  . Levofloxacin Swelling    Other reaction(s): Altered mental status (finding), Itching of Skin  . Other Swelling    All Mycins..  . Penicillins Swelling  . Pork (Porcine) Protein     Other reaction(s): Other (qualifier value)  . Prevacid [Lansoprazole]     Other reaction(s): Unknown Other reaction(s): Unknown    Family History: Family History  Problem Relation Age of Onset  . Diabetes Mother   . Heart disease Mother   . Hypertension Mother   . Depression Father   . Heart disease Father   . Hypertension Father   . Prostate cancer Neg Hx   . Bladder Cancer Neg Hx   . Kidney cancer Neg Hx     Social History:  reports that he quit smoking about 23 years ago. He has never used smokeless tobacco. He reports that he does not drink alcohol or use drugs.  ROS: UROLOGY Frequent Urination?: Yes Hard to postpone urination?: No Burning/pain with urination?: No Get up at night to urinate?: Yes Leakage of urine?:  No Urine stream starts and stops?: Yes Trouble starting stream?: Yes Do you have to strain to urinate?: No Blood in urine?: No Urinary tract infection?: No Sexually transmitted disease?: No Injury to kidneys or bladder?: No Painful intercourse?: No Weak stream?: Yes Erection problems?: Yes Penile pain?: No  Gastrointestinal Nausea?: No Vomiting?: No Indigestion/heartburn?: No Diarrhea?: No Constipation?: Yes  Constitutional Fever: No Night sweats?: Yes Weight loss?: No Fatigue?: Yes  Skin Skin rash/lesions?: No Itching?: No  Eyes Blurred vision?: Yes Double vision?: No  Ears/Nose/Throat Sore throat?: No Sinus problems?: Yes  Hematologic/Lymphatic Swollen glands?: No Easy bruising?: Yes  Cardiovascular Leg swelling?: No Chest pain?: Yes  Respiratory Cough?: No Shortness of breath?: Yes  Endocrine Excessive thirst?: No  Musculoskeletal Back pain?: Yes Joint pain?: Yes  Neurological Headaches?: No Dizziness?: No  Psychologic Depression?: Yes Anxiety?: Yes  Physical Exam: BP (!) 107/56 (BP Location: Left Arm, Patient Position: Sitting, Cuff Size: Normal)   Pulse 73   Ht 5\' 8"  (1.727 m)   Wt 204 lb 4.8 oz (92.7 kg)   BMI 31.06 kg/m   Constitutional: Well nourished. Alert and oriented, No acute distress. HEENT: [place] AT, moist mucus membranes. Trachea midline, no masses. Cardiovascular: No clubbing, cyanosis, or edema. Respiratory: Normal respiratory effort, no increased work of breathing. GI: Abdomen is soft, non tender, non distended, no abdominal masses. Liver and spleen not palpable.  No hernias appreciated.  Stool sample for occult testing is not indicated.   GU: No CVA tenderness.  No bladder fullness or masses.  Normal phallus.  Urethral meatus is patent.  No penile discharge. No penile lesions or rashes. Scrotum without lesions, cysts, rashes and/or edema.  Testicles are located scrotally bilaterally. No masses are appreciated in the  testicles. Left and right epididymis are normal. Rectal: Patient with  normal  sphincter tone. Anus and perineum without scarring or rashes. No rectal masses are appreciated. Prostate is approximately 45 grams, no nodules are appreciated. Seminal vesicles are normal. Skin: No rashes, bruises or suspicious lesions. Lymph: No cervical or inguinal adenopathy. Neurologic: Grossly intact, no focal deficits, moving all 4 extremities. Psychiatric: Normal mood and affect.  Laboratory Data: Lab Results  Component Value Date   WBC 6.9 02/09/2017   HGB 13.9 02/09/2017   HCT 40.9 02/09/2017   MCV 88.1 02/09/2017   PLT 270 02/09/2017    Lab Results  Component Value Date   CREATININE 1.05 02/09/2017   I have reviewed the labs.   Assessment & Plan:    1. Neurogenic Bladder  - patient will be referred to a catheter service so his insurance can cover his catheters   2. PSA screening  - I explained to the patient that the AUA panel feels that in men age 19 to 12 years, there was sufficient certainty that the benefits of screening could outweigh the harms that a recommendation of shared decision-making in this age group was justified.  - he states he would like his PSA checked at this time  - RTC pending PSA results  3. Erectile dysfunction  - I explained to the patient that in order to achieve an erection it takes good functioning of the nervous system (parasympathetic and rs, sympathetic, sensory and motor), good blood flow into the erectile tissue of the penis and a desire to have sex  - I explained that conditions like diabetes, hypertension, coronary artery disease, peripheral vascular disease, smoking, alcohol consumption, age, sleep apnea and BPH can diminish the ability to have an erection  - I explained the ED may be a risk marker for underlying CVD and he should follow up with PCP for further studies   - we will obtain a serum testosterone level at this time; if it is abnormal we will  need to repeat the study for confirmation  - A recent study published in Sex Med 2018 Apr 13 revealed moderate to vigorous aerobic exercise for 40 minutes 4 times per week can decrease erectile problems caused by physical inactivity, obesity, hypertension, metabolic syndrome and/or cardiovascular diseases  - RTC pending testosterone results  4. BPH with retention  - voiding very little on his own  5. Nocturia  - I explained to the patient that nocturia is often multi-factorial and difficult to treat.  Sleeping disorders, heart conditions, peripheral vascular disease, diabetes, an enlarged prostate for men, an urethral stricture causing bladder outlet obstruction and/or certain medications can contribute to nocturia.  - I have suggested that the patient avoid caffeine after noon and alcohol in the evening.  He or she may also benefit from fluid restrictions after 6:00 in the evening and voiding just prior to bedtime.  - I have explained that research studies have showed that over 84% of patients with sleep apnea reported frequent nighttime urination.   With sleep apnea, oxygen decreases, carbon dioxide increases, the blood become more acidic, the heart rate drops and blood vessels in the lung constrict.  The body is then alerted that something is very wrong. The sleeper must wake enough to reopen the airway. By this time, the heart is racing and experiences a false signal of fluid overload. The heart excretes a hormone-like protein that tells the body to get rid of sodium and water, resulting in nocturia.  -  I also informed the patient that a recent study noted that decreasing  sodium intake to 2.3 grams daily, if they don't have issues with hyponatremia, can also reduce the number of nightly voids  - The patient may benefit from a discussion with his or her primary care physician to see if he or she has risk factors for sleep apnea or other sleep disturbances and obtaining a sleep study.  6. Rectal  bleeding  - patient STRONGLY ENCOURAGED to contact Dr. Barbarann Ehlers office and schedule an appointment as he has not been seen in quite some time   7. Gross hematuria  - most likely due to trauma from Coude catheter as he has usually uses straight caths   Return for pending PSA and testosterone results.  These notes generated with voice recognition software. I apologize for typographical errors.  [name], [facility] [address], [place], [place] [address] [phone]

## 2017-03-06 ENCOUNTER — Other Ambulatory Visit: Payer: Self-pay

## 2017-03-06 ENCOUNTER — Ambulatory Visit: Payer: 59

## 2017-03-06 ENCOUNTER — Telehealth: Payer: Self-pay

## 2017-03-06 ENCOUNTER — Ambulatory Visit: Admission: RE | Admit: 2017-03-06 | Payer: 59 | Source: Ambulatory Visit

## 2017-03-06 DIAGNOSIS — E291 Testicular hypofunction: Secondary | ICD-10-CM

## 2017-03-06 LAB — PSA: PROSTATE SPECIFIC AG, SERUM: 0.4 ng/mL (ref 0.0–4.0)

## 2017-03-06 LAB — TESTOSTERONE: Testosterone: 227 ng/dL — ABNORMAL LOW (ref 264–916)

## 2017-03-06 NOTE — Telephone Encounter (Signed)
Spoke with pt in reference to lab results. Pt voiced understanding. Lab appt made and orders placed.

## 2017-03-06 NOTE — Telephone Encounter (Signed)
-----   Message from Nori Riis, PA-C sent at 03/06/2017  7:41 AM EDT ----- Please let Mr. Todd Banks know that his PSA was normal, but his testosterone is low.  He will need to have a morning blood draw before 9 AM for conformation.

## 2017-03-09 ENCOUNTER — Other Ambulatory Visit: Payer: Medicare Other

## 2017-03-09 ENCOUNTER — Encounter: Payer: Self-pay | Admitting: Psychiatry

## 2017-03-09 ENCOUNTER — Ambulatory Visit (INDEPENDENT_AMBULATORY_CARE_PROVIDER_SITE_OTHER): Payer: 59 | Admitting: Psychiatry

## 2017-03-09 ENCOUNTER — Encounter: Payer: Self-pay | Admitting: Urology

## 2017-03-09 VITALS — BP 114/67 | HR 83 | Temp 98.0°F | Wt 205.0 lb

## 2017-03-09 DIAGNOSIS — F331 Major depressive disorder, recurrent, moderate: Secondary | ICD-10-CM

## 2017-03-09 DIAGNOSIS — G894 Chronic pain syndrome: Secondary | ICD-10-CM | POA: Diagnosis not present

## 2017-03-09 MED ORDER — ESCITALOPRAM OXALATE 20 MG PO TABS: 20.0000 mg | ORAL_TABLET | Freq: Every day | ORAL | 1 refills | Status: DC

## 2017-03-09 MED ORDER — BUPROPION HCL ER (SR) 150 MG PO TB12: 150.0000 mg | ORAL_TABLET | Freq: Every day | ORAL | 1 refills | Status: DC

## 2017-03-09 MED ORDER — TRAZODONE HCL 150 MG PO TABS: 150.0000 mg | ORAL_TABLET | Freq: Every day | ORAL | 1 refills | Status: DC

## 2017-03-09 NOTE — Progress Notes (Signed)
Psychiatric MD Progress Note  Patient Identification: Todd Banks MRN:  [id number] Date of Evaluation:  03/09/2017 Referral Source: Dr Toy Care Chief Complaint:   Chief Complaint    Follow-up; Medication Refill     Visit Diagnosis:    ICD-10-CM   1. MDD (major depressive disorder), recurrent episode, moderate (HCC) F33.1   2. Chronic pain syndrome G89.4     History of Present Illness:    Pt  is a 66 year old married male who presented for follow up. He reported that he Is unable to work as he went fishing with his brother and hurt his back. He reported that he has been taking pain medications as prescribed by Dr. Primus Bravo in [place]. Patient reported that he has been compliant with his medications. He denied having any side effects. He stated that he sleeps well with the help of the trazodone. He denied having any perceptual disturbances. He currently lives with his family. He appears calm and alert during the interview. We discussed about his medication in detail.   Patient denied having any mood swings anger anxiety or paranoia. He denied having any perceptual disturbances.        Associated Signs/Symptoms: Depression Symptoms:  depressed mood, anxiety, decreased appetite, (Hypo) Manic Symptoms:  Irritable Mood, Labiality of Mood, Anxiety Symptoms:  Excessive Worry, Psychotic Symptoms:  none PTSD Symptoms: Negative NA  Past Psychiatric History:  Admitted to [facility]- past No suicide attempt  Previous Psychotropic Medications: zoloft   Substance Abuse History in the last 12 months:  No.  Consequences of Substance Abuse: Negative NA  Past Medical History:  Past Medical History:  Diagnosis Date  . Anemia   . Anxiety   . Chronic back pain   . Constipation   . DDD (degenerative disc disease), lumbar   . Depression   . Diabetes mellitus without complication ([place])   . Diabetes mellitus, type II ([place])   . Dizziness   . Heart disease   . Hypertension   .  Intermittent self-catheterization of bladder   . Kidney stones   . PUD (peptic ulcer disease)   . Shingles     Past Surgical History:  Procedure Laterality Date  . BACK SURGERY    . CARDIAC CATHETERIZATION N/A 08/28/2015   Procedure: Left Heart Cath and Coronary Angiography;  Surgeon: Isaias Cowman, MD;  Location: [place] [facility];  Service: Cardiovascular;  Laterality: N/A;  . CHOLECYSTECTOMY    . CORONARY STENT PLACEMENT    . ESOPHAGOGASTRODUODENOSCOPY (EGD) WITH PROPOFOL N/A 09/29/2016   Procedure: ESOPHAGOGASTRODUODENOSCOPY (EGD) WITH PROPOFOL;  Surgeon: Manya Silvas, MD;  Location: [facility] ENDOSCOPY;  Service: Endoscopy;  Laterality: N/A;  . HAND SURGERY Right     Family Psychiatric History:  Father- alcohol Son, daughter- Depression   Family History:  Family History  Problem Relation Age of Onset  . Diabetes Mother   . Heart disease Mother   . Hypertension Mother   . Depression Father   . Heart disease Father   . Hypertension Father   . Prostate cancer Neg Hx   . Bladder Cancer Neg Hx   . Kidney cancer Neg Hx     Social History:   Social History   Social History  . Marital status: Married    Spouse name: N/A  . Number of children: N/A  . Years of education: N/A   Social History Main Topics  . Smoking status: Former Smoker    Quit date: 07/22/1993  . Smokeless tobacco: Never Used  .  Alcohol use No  . Drug use: No  . Sexual activity: Not Currently   Other Topics Concern  . None   Social History Narrative  . 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  Married - 27 +   Has 3 girls, 1 boys.   Allergies:   Allergies  Allergen Reactions  . Cephalexin Hives    Other reaction(s): Unknown Other reaction(s): Unknown  . Cephalosporins Swelling    Other Reaction: reacts to Keflex  . Ampicillin Swelling    Other reaction(s): Unknown  . Cefazolin Swelling    Other reaction(s): Other  (qualifier value)  . Cefuroxime Axetil Swelling  . Ciprofloxacin Swelling    Other reaction(s): Respiratory distress (finding), Weal  . Clindamycin Swelling  . Doxycycline Swelling    Other reaction(s): Unknown  . Erythromycin Swelling    Other reaction(s): Unknown  . Latex Other (See Comments)    Other reaction(s): BLISTERS  . Levofloxacin Swelling    Other reaction(s): Altered mental status (finding), Itching of Skin  . Other Swelling    All Mycins..  . Penicillins Swelling  . Pork (Porcine) Protein     Other reaction(s): Other (qualifier value)  . Prevacid [Lansoprazole]     Other reaction(s): Unknown Other reaction(s): Unknown    Metabolic Disorder Labs: No results found for: HGBA1C, MPG No results found for: PROLACTIN No results found for: CHOL, TRIG, HDL, CHOLHDL, VLDL, LDLCALC   Current Medications: Current Outpatient Prescriptions  Medication Sig Dispense Refill  . aspirin 325 MG tablet Take 325 mg by mouth daily.    Marland Kitchen buPROPion (WELLBUTRIN SR) 150 MG 12 hr tablet Take 1 tablet (150 mg total) by mouth daily. 90 tablet 1  . cyanocobalamin (,VITAMIN B-12,) 1000 MCG/ML injection     . docusate sodium (COLACE) 50 MG capsule Take by mouth.    . escitalopram (LEXAPRO) 20 MG tablet Take 1 tablet (20 mg total) by mouth daily. 90 tablet 1  . gemfibrozil (LOPID) 600 MG tablet Take 600 mg by mouth 2 (two) times daily before a meal.    . glucose blood (ONE TOUCH ULTRA TEST) test strip Use as instructed 5 times daily.    . insulin glargine (LANTUS) 100 UNIT/ML injection Inject 70 Units into the skin at bedtime.     . insulin lispro (HUMALOG) 100 UNIT/ML injection 20 Units 3 (three) times daily before meals. Sliding scale    . Insulin Pen Needle (EXEL COMFORT POINT PEN NEEDLE) 29G X 12MM MISC Use one four times daily.    . isosorbide dinitrate (ISORDIL) 30 MG tablet Take 30 mg by mouth 2 (two) times daily.    . isosorbide mononitrate (IMDUR) 30 MG 24 hr tablet     . metFORMIN  (GLUCOPHAGE) 1000 MG tablet Take 1,000 mg by mouth 2 (two) times daily with a meal.    . methadone (DOLOPHINE) 10 MG tablet Take by mouth.    . metoprolol succinate (TOPROL-XL) 25 MG 24 hr tablet Take 25 mg by mouth daily.    Marland Kitchen NEEDLE, DISP, 25 G (BD SAFETYGLIDE SHIELDED NEEDLE) 25G X 1" MISC Use 1 each monthly. for B12 injections    . nitroGLYCERIN (NITROSTAT) 0.4 MG SL tablet Place under the tongue.    Marland Kitchen omeprazole (PRILOSEC) 20 MG capsule Take 20 mg by mouth 2 (two) times daily before a meal.    . orphenadrine (NORFLEX) 100 MG tablet Limit  1 tab po / day or  bid  If tolerated 60 tablet 2  . oxyCODONE (OXY IR/ROXICODONE) 5 MG immediate release tablet LIMIT 1 TAB BY MOUTH 2 TO 4 TIMES PER DAY FOR BREAKTHROUGH PAIN WHILE TAKING METHADONE IF TOLERATED  0  . prasugrel (EFFIENT) 10 MG TABS tablet Take 10 mg by mouth daily.    . pravastatin (PRAVACHOL) 40 MG tablet Take 40 mg by mouth daily.    Marland Kitchen RANEXA 500 MG 12 hr tablet Take 500 mg by mouth 2 (two) times daily.  0  . SYRINGE-NEEDLE, DISP, 3 ML (MEDSAVER SYRINGE 3CC/23GX1") 23G X 1" 3 ML MISC Use as directed with  cyanocobalamin    . traZODone (DESYREL) 150 MG tablet Take 1 tablet (150 mg total) by mouth at bedtime. 90 tablet 1  . pioglitazone (ACTOS) 15 MG tablet Take by mouth.     No current facility-administered medications for this visit.     Neurologic: Headache: No Seizure: No Paresthesias:No  Musculoskeletal: Strength & Muscle Tone: within normal limits Gait & Station: normal Patient leans: N/A  Psychiatric Specialty Exam: ROS  Blood pressure 114/67, pulse 83, temperature 98 F (36.7 C), temperature source Oral, weight 205 lb (93 kg).Body mass index is 31.17 kg/m.  General Appearance: Casual  Eye Contact:  Fair  Speech:  Clear and Coherent  Volume:  Normal  Mood:  Anxious and Depressed  Affect:  Congruent  Thought Process:  Coherent  Orientation:  Full (Time, Place, and Person)  Thought Content:  Logical  Suicidal  Thoughts:  No  Homicidal Thoughts:  No  Memory:  Immediate;   Fair Recent;   Fair Remote;   Fair  Judgement:  Fair  Insight:  Fair  Psychomotor Activity:  Normal  Concentration:  Concentration: Fair and Attention Span: Fair  Recall:  [employer] of Knowledge:Fair  Language: Fair  Akathisia:  No  Handed:  Right  AIMS (if indicated):    Assets:  Communication Skills Desire for Improvement Social Support  ADL's:  Intact  Cognition: WNL  Sleep:      Treatment Plan Summary: Medication management  Discussed with patient about his medications.  He will continue on Wellbutrin XL 150 mg in the morning. Will take Lexapro 20 mg at noon. Pt  taking trazodone 150 mg at bedtime to help with the sleep.   Follow-up in 2 month or earlier depending on his symptoms   More than 50% of the time spent in psychoeducation, counseling and coordination of care.    This note was generated in part or whole with voice recognition software. Voice regonition is usually quite accurate but there are transcription errors that can and very often do occur. I apologize for any typographical errors that were not detected and corrected.   Rainey Pines, MD 8/27/20183:22 PM

## 2017-03-13 ENCOUNTER — Telehealth: Payer: Self-pay | Admitting: Urology

## 2017-03-13 ENCOUNTER — Other Ambulatory Visit: Payer: Medicare Other

## 2017-03-13 DIAGNOSIS — E291 Testicular hypofunction: Secondary | ICD-10-CM

## 2017-03-13 NOTE — Telephone Encounter (Signed)
Please send my note to Dr. Kary Kos.

## 2017-03-14 LAB — TESTOSTERONE: TESTOSTERONE: 184 ng/dL — AB (ref 264–916)

## 2017-03-17 ENCOUNTER — Telehealth: Payer: Self-pay

## 2017-03-17 DIAGNOSIS — E291 Testicular hypofunction: Secondary | ICD-10-CM

## 2017-03-17 NOTE — Telephone Encounter (Signed)
-----   Message from Nori Riis, PA-C sent at 03/14/2017  6:37 PM EDT ----- Please let Mr. Diskin know that his testosterone is low.  We will need one more blood draw before 10 AM for conformation.

## 2017-03-17 NOTE — Telephone Encounter (Signed)
LMOM

## 2017-03-18 NOTE — Telephone Encounter (Signed)
Spoke with pt in reference to lab results. Pt voiced understanding. Lab appt made and orders placed.

## 2017-03-19 NOTE — Telephone Encounter (Signed)
faxed

## 2017-03-20 ENCOUNTER — Other Ambulatory Visit: Payer: Self-pay

## 2017-03-26 ENCOUNTER — Other Ambulatory Visit: Payer: Medicare Other

## 2017-03-26 DIAGNOSIS — E291 Testicular hypofunction: Secondary | ICD-10-CM

## 2017-03-27 ENCOUNTER — Telehealth: Payer: Self-pay

## 2017-03-27 LAB — TESTOSTERONE: TESTOSTERONE: 214 ng/dL — AB (ref 264–916)

## 2017-03-27 NOTE — Telephone Encounter (Signed)
Labs were added at [facility].

## 2017-03-27 NOTE — Telephone Encounter (Signed)
-----   Message from Nori Riis, PA-C sent at 03/27/2017  8:23 AM EDT ----- Please add a FSH/LH and prolactin level to his blood work.

## 2017-03-31 ENCOUNTER — Telehealth: Payer: Self-pay

## 2017-03-31 LAB — FSH/LH
FSH: 10.6 m[IU]/mL (ref 1.5–12.4)
LH: 9.2 m[IU]/mL — ABNORMAL HIGH (ref 1.7–8.6)

## 2017-03-31 LAB — PROLACTIN: PROLACTIN: 19 ng/mL — AB (ref 4.0–15.2)

## 2017-03-31 LAB — SPECIMEN STATUS REPORT

## 2017-03-31 NOTE — Telephone Encounter (Signed)
Pt has chronic retention.

## 2017-03-31 NOTE — Telephone Encounter (Signed)
LMOM

## 2017-03-31 NOTE — Telephone Encounter (Signed)
-----  Message from Nori Riis, PA-C sent at 03/29/2017  6:15 PM EDT ----- Please notify Mr. Schoch that he has met the criteria for testosterone deficiency.  He will need an appointment to discuss treatment options.

## 2017-04-01 ENCOUNTER — Ambulatory Visit: Admission: RE | Admit: 2017-04-01 | Payer: 59 | Source: Ambulatory Visit

## 2017-04-01 NOTE — Telephone Encounter (Signed)
No answer

## 2017-04-02 NOTE — Telephone Encounter (Signed)
Not able to get in touch with pt. Will send a letter.

## 2017-04-09 ENCOUNTER — Ambulatory Visit: Admission: RE | Admit: 2017-04-09 | Payer: 59 | Source: Ambulatory Visit

## 2017-04-09 ENCOUNTER — Ambulatory Visit: Payer: 59

## 2017-04-13 NOTE — Progress Notes (Signed)
04/15/2017 3:04 PM   Todd Banks 1950-11-13 [id number]  Referring provider: Maryland Pink, MD [address] [facility] [place], [place] [address]  Chief Complaint  Patient presents with  . Follow-up    1 month Hypogonadism Discuss Treatment Options    HPI: Patient is a 66 year old Caucasian male with a neurogenic bladder, ED, BPH with LU TS, nocturia and testosterone deficiency who presents today for follow-up.  Testosterone deficiency Patient is experiencing a decrease in libido, a lack of energy, a decrease in strength, a loss in height, a decreased enjoyment in life,  sadness and/or grumpiness, erections being less strong and a recent deterioration in their work performance.  This is indicated by his responses to the ADAM questionnaire.   He is not having spontaneous erections at night.   He has not been diagnosed with sleep apnea.   He is reporting obesity, poor memory, medications that affect testosterone production, such as high-dose glucocorticoids for a prolonged period and sustained-release opioids and type 2 diabetes mellitus.  His pretreatment testosterone levels were 184 ng/dL on 03/13/2017 and 214 ng/dL on 03/26/2017.  His LH and prolactin were slightly elevated.  FSH was normal.       Androgen Deficiency in the Aging Male    [place] Name 04/15/17 1600         Androgen Deficiency in the Aging Male   Do you have a decrease in libido (sex drive) Yes     Do you have lack of energy Yes     Do you have a decrease in strength and/or endurance Yes     Have you lost height Yes     Have you noticed a decreased "enjoyment of life" Yes     Are you sad and/or grumpy Yes     Are your erections less strong Yes     Have you noticed a recent deterioration in your ability to play sports No     Are you falling asleep after dinner Yes     Has there been a recent deterioration in your work performance Yes          BPH with retention/neurogenic bladder His IPSS score  today is 23, which is severe lower urinary tract symptomatology.  He is terrible with his quality life due to his urinary symptoms. He self catheters 90% of the time.  He has had to self cath for several years.  He denies any dysuria, hematuria or suprapubic pain.  He also denies any recent fevers, chills, nausea or vomiting.  He does not have a family history of PCa.   He states that he has been managing a neurogenic bladder with CIC after a back surgery ~ 18 years ago.  He is cathing himself 10 times in a 24 hour period.  He is cathing 4 times nightly.        IPSS    Row Name 04/15/17 1600         International Prostate Symptom Score   How often have you had the sensation of not emptying your bladder? Almost always     How often have you had to urinate less than every two hours? About half the time     How often have you found you stopped and started again several times when you urinated? Almost always     How often have you found it difficult to postpone urination? Not at All     How often have you had  a weak urinary stream? Almost always     How often have you had to strain to start urination? Not at All     How many times did you typically get up at night to urinate? 5 Times     Total IPSS Score 23       Quality of Life due to urinary symptoms   If you were to spend the rest of your life with your urinary condition just the way it is now how would you feel about that? Terrible        Score:  1-7 Mild 8-19 Moderate 20-35 Severe   Erectile dysfunction His SHIM score is 5, which is severe ED.   He has been having difficulty with erections for several years.   His major complaint is no erections.  His libido is diminished.   His risk factors for ED are age, testosterone deficiency, spinal injury, DM, HTN, HLD, CAD, stress,  anxiety, depression, alcohol abuse, smoking, antidepressants and pain medications.  He denies any painful erections or curvatures with his erections.   He is no  longer having spontaneous erections.       SHIM    Row Name 04/15/17 1603         SHIM: Over the last 6 months:   How do you rate your confidence that you could get and keep an erection? Very Low     When you had erections with sexual stimulation, how often were your erections hard enough for penetration (entering your partner)? Almost Never or Never     During sexual intercourse, how often were you able to maintain your erection after you had penetrated (entered) your partner? Almost Never or Never     During sexual intercourse, how difficult was it to maintain your erection to completion of intercourse? Extremely Difficult     When you attempted sexual intercourse, how often was it satisfactory for you? Almost Never or Never       SHIM Total Score   SHIM 5        Score: 1-7 Severe ED 8-11 Moderate ED 12-16 Mild-Moderate ED 17-21 Mild ED 22-25 No ED  PMH: Past Medical History:  Diagnosis Date  . Anemia   . Anxiety   . Chronic back pain   . Constipation   . Coronary artery disease   . DDD (degenerative disc disease), lumbar   . Depression   . Diabetes mellitus without complication ([place])   . Diabetes mellitus, type II ([place])   . Dizziness   . Heart disease   . Hypertension   . Intermittent self-catheterization of bladder   . Kidney stones   . PUD (peptic ulcer disease)   . Shingles     Surgical History: Past Surgical History:  Procedure Laterality Date  . BACK SURGERY    . CARDIAC CATHETERIZATION N/A 08/28/2015   Procedure: Left Heart Cath and Coronary Angiography;  Surgeon: Isaias Cowman, MD;  Location: [facility];  Service: Cardiovascular;  Laterality: N/A;  . CHOLECYSTECTOMY    . CORONARY ANGIOPLASTY    . CORONARY STENT PLACEMENT    . ESOPHAGOGASTRODUODENOSCOPY (EGD) WITH PROPOFOL N/A 09/29/2016   Procedure: ESOPHAGOGASTRODUODENOSCOPY (EGD) WITH PROPOFOL;  Surgeon: Manya Silvas, MD;  Location: [facility] END[facility]COPY;  Service: Endoscopy;   Laterality: N/A;  . HAND SURGERY Right     Home Medications:  Allergies as of 04/15/2017      Reactions   Cephalexin Hives   Other reaction(s): Unknown Other reaction(s): Unknown  Cephalosporins Swelling   Other Reaction: reacts to Keflex   Ampicillin Swelling   Other reaction(s): Unknown   Cefazolin Swelling   Other reaction(s): Other (qualifier value)   Cefuroxime Axetil Swelling   Ciprofloxacin Swelling   Other reaction(s): Respiratory distress (finding), Weal   Clindamycin Swelling   Doxycycline Swelling   Other reaction(s): Unknown   Erythromycin Swelling   Other reaction(s): Unknown   Latex Other (See Comments)   Other reaction(s): BLISTERS   Levofloxacin Swelling   Other reaction(s): Altered mental status (finding), Itching of Skin   Other Swelling   All Mycins..   Penicillins Swelling   Pork (porcine) Protein    Other reaction(s): Other (qualifier value)   Prevacid [lansoprazole]    Other reaction(s): Unknown Other reaction(s): Unknown      Medication List       Accurate as of 04/15/17 11:59 PM. Always use your most recent med list.          aspirin 325 MG tablet Take 325 mg by mouth daily.   BD SAFETYGLIDE SHIELDED NEEDLE 25G X 1" Misc Generic drug:  NEEDLE (DISP) 25 G Use 1 each monthly. for B12 injections   buPROPion 150 MG 12 hr tablet Commonly known as:  WELLBUTRIN SR Take 1 tablet (150 mg total) by mouth daily.   cyanocobalamin 1000 MCG/ML injection Commonly known as:  (VITAMIN B-12)   docusate sodium 50 MG capsule Commonly known as:  COLACE Take by mouth.   escitalopram 20 MG tablet Commonly known as:  LEXAPRO Take 1 tablet (20 mg total) by mouth daily.   EXEL COMFORT POINT PEN NEEDLE 29G X 12MM Misc Generic drug:  Insulin Pen Needle Use one four times daily.   gemfibrozil 600 MG tablet Commonly known as:  LOPID Take 600 mg by mouth 2 (two) times daily before a meal.   insulin glargine 100 UNIT/ML injection Commonly known as:   LANTUS Inject 70 Units into the skin at bedtime.   insulin lispro 100 UNIT/ML injection Commonly known as:  HUMALOG 15 Units 3 (three) times daily before meals. Sliding scale   isosorbide dinitrate 30 MG tablet Commonly known as:  ISORDIL Take 30 mg by mouth 2 (two) times daily.   isosorbide mononitrate 30 MG 24 hr tablet Commonly known as:  IMDUR   MEDSAVER SYRINGE 3CC/23GX1" 23G X 1" 3 ML Misc Generic drug:  SYRINGE-NEEDLE (DISP) 3 ML Use as directed with  cyanocobalamin   metFORMIN 1000 MG tablet Commonly known as:  GLUCOPHAGE Take 1,000 mg by mouth 2 (two) times daily with a meal.   metoprolol succinate 25 MG 24 hr tablet Commonly known as:  TOPROL-XL Take 25 mg by mouth daily.   nitroGLYCERIN 0.4 MG SL tablet Commonly known as:  NITROSTAT Place under the tongue.   omeprazole 20 MG capsule Commonly known as:  PRILOSEC Take 20 mg by mouth 2 (two) times daily before a meal.   ONE TOUCH ULTRA TEST test strip Generic drug:  glucose blood Use as instructed 5 times daily.   orphenadrine 100 MG tablet Commonly known as:  NORFLEX Limit  1 tab po / day or bid  If tolerated   oxyCODONE 5 MG immediate release tablet Commonly known as:  Oxy IR/ROXICODONE LIMIT 1 TAB BY MOUTH 2 TO 4 TIMES PER DAY FOR BREAKTHROUGH PAIN WHILE TAKING METHADONE IF TOLERATED   pioglitazone 15 MG tablet Commonly known as:  ACTOS Take by mouth.   prasugrel 10 MG Tabs tablet Commonly known as:  EFFIENT Take 10 mg by mouth daily.   pravastatin 40 MG tablet Commonly known as:  PRAVACHOL Take 40 mg by mouth daily.   RANEXA 500 MG 12 hr tablet Generic drug:  ranolazine Take 500 mg by mouth 2 (two) times daily.   traZODone 150 MG tablet Commonly known as:  DESYREL Take 1 tablet (150 mg total) by mouth at bedtime.       Allergies:  Allergies  Allergen Reactions  . Cephalexin Hives    Other reaction(s): Unknown Other reaction(s): Unknown  . Cephalosporins Swelling    Other  Reaction: reacts to Keflex  . Ampicillin Swelling    Other reaction(s): Unknown  . Cefazolin Swelling    Other reaction(s): Other (qualifier value)  . Cefuroxime Axetil Swelling  . Ciprofloxacin Swelling    Other reaction(s): Respiratory distress (finding), Weal  . Clindamycin Swelling  . Doxycycline Swelling    Other reaction(s): Unknown  . Erythromycin Swelling    Other reaction(s): Unknown  . Latex Other (See Comments)    Other reaction(s): BLISTERS  . Levofloxacin Swelling    Other reaction(s): Altered mental status (finding), Itching of Skin  . Other Swelling    All Mycins..  . Penicillins Swelling  . Pork (Porcine) Protein     Other reaction(s): Other (qualifier value)  . Prevacid [Lansoprazole]     Other reaction(s): Unknown Other reaction(s): Unknown    Family History: Family History  Problem Relation Age of Onset  . Diabetes Mother   . Heart disease Mother   . Hypertension Mother   . Depression Father   . Heart disease Father   . Hypertension Father   . Prostate cancer Neg Hx   . Bladder Cancer Neg Hx   . Kidney cancer Neg Hx     Social History:  reports that he quit smoking about 23 years ago. He has never used smokeless tobacco. He reports that he does not drink alcohol or use drugs.  ROS: UROLOGY Frequent Urination?: No Hard to postpone urination?: No Burning/pain with urination?: No Get up at night to urinate?: No Leakage of urine?: No Urine stream starts and stops?: No Trouble starting stream?: No Do you have to strain to urinate?: No Blood in urine?: No Urinary tract infection?: No Sexually transmitted disease?: No Injury to kidneys or bladder?: No Painful intercourse?: No Weak stream?: No Erection problems?: No Penile pain?: No  Gastrointestinal Nausea?: No Vomiting?: No Indigestion/heartburn?: No Diarrhea?: No Constipation?: No  Constitutional Fever: No Night sweats?: No Weight loss?: No Fatigue?: No  Skin Skin  rash/lesions?: No Itching?: No  Eyes Blurred vision?: No Double vision?: No  Ears/Nose/Throat Sore throat?: No Sinus problems?: No  Hematologic/Lymphatic Swollen glands?: No Easy bruising?: No  Cardiovascular Leg swelling?: No Chest pain?: No  Respiratory Cough?: No Shortness of breath?: Yes  Endocrine Excessive thirst?: No  Musculoskeletal Back pain?: Yes Joint pain?: No  Neurological Headaches?: No Dizziness?: No  Psychologic Depression?: Yes Anxiety?: No  Physical Exam: BP 127/66   Pulse 76   Ht '5\' 8"'$  (1.727 m)   Wt 211 lb 12.8 oz (96.1 kg)   BMI 32.20 kg/m   Constitutional: Well nourished. Alert and oriented, No acute distress. HEENT: [place] AT, moist mucus membranes. Trachea midline, no masses. Cardiovascular: No clubbing, cyanosis, or edema. Respiratory: Normal respiratory effort, no increased work of breathing. Skin: No rashes, bruises or suspicious lesions. Lymph: No cervical or inguinal adenopathy. Neurologic: Grossly intact, no focal deficits, moving all 4 extremities. Psychiatric: Normal mood and affect.  Laboratory Data: PSA History  0.4 in 02/2017   Lab Results  Component Value Date   WBC 6.9 02/09/2017   HGB 13.9 02/09/2017   HCT 40.9 02/09/2017   MCV 88.1 02/09/2017   PLT 270 02/09/2017    Lab Results  Component Value Date   CREATININE 1.05 02/09/2017   I have reviewed the labs.   Assessment & Plan:    1. Testosterone deficiency  - Patient has met criteria for testosterone deficiency  - As patient has multiple comorbidities I would like to start him on Clomid to see if he responds fairly to this medication rather than exogenous testosterone at this time - will check LFT's prior to initiating the medication  2. Neurogenic Bladder  - patient was referred to a catheter service so his insurance can cover his catheters   3. Erectile dysfunction  - did discuss intracavernosal injections, the patient is not agitated at this  time  5. BPH with retention  - voiding very little on his own  6. Nocturia  - I explained to the patient that nocturia is often multi-factorial and difficult to treat.  Sleeping disorders, heart conditions, peripheral vascular disease, diabetes, an enlarged prostate for men, an urethral stricture causing bladder outlet obstruction and/or certain medications can contribute to nocturia.  - I have suggested that the patient avoid caffeine after noon and alcohol in the evening.  He or she may also benefit from fluid restrictions after 6:00 in the evening and voiding just prior to bedtime.  - I have explained that research studies have showed that over 84% of patients with sleep apnea reported frequent nighttime urination.   With sleep apnea, oxygen decreases, carbon dioxide increases, the blood become more acidic, the heart rate drops and blood vessels in the lung constrict.  The body is then alerted that something is very wrong. The sleeper must wake enough to reopen the airway. By this time, the heart is racing and experiences a false signal of fluid overload. The heart excretes a hormone-like protein that tells the body to get rid of sodium and water, resulting in nocturia.  -  I also informed the patient that a recent study noted that decreasing sodium intake to 2.3 grams daily, if they don't have issues with hyponatremia, can also reduce the number of nightly voids  - The patient may benefit from a discussion with his or her primary care physician to see if he or she has risk factors for sleep apnea or other sleep disturbances and obtaining a sleep study - has not seen his PCP  6. Rectal bleeding  - patient STRONGLY ENCOURAGED to contact Dr. Barbarann Ehlers office and schedule an appointment as he has not been seen in quite some time - has not seen PCP  7. History of  hematuria  - most likely due to trauma from Coude catheter as he has usually uses straight caths - no further hematuria   Return in  about 1 month (around 05/16/2017) for Testosterone before 10 AM.  These notes generated with voice recognition software. I apologize for typographical errors.  [name], [facility] [address] [place], [place] [address] [phone]

## 2017-04-15 ENCOUNTER — Ambulatory Visit (INDEPENDENT_AMBULATORY_CARE_PROVIDER_SITE_OTHER): Payer: 59 | Admitting: Urology

## 2017-04-15 ENCOUNTER — Encounter: Payer: Self-pay | Admitting: Urology

## 2017-04-15 VITALS — BP 127/66 | HR 76 | Ht 68.0 in | Wt 211.8 lb

## 2017-04-15 DIAGNOSIS — N529 Male erectile dysfunction, unspecified: Secondary | ICD-10-CM | POA: Diagnosis not present

## 2017-04-15 DIAGNOSIS — Z87448 Personal history of other diseases of urinary system: Secondary | ICD-10-CM | POA: Diagnosis not present

## 2017-04-15 DIAGNOSIS — R351 Nocturia: Secondary | ICD-10-CM

## 2017-04-15 DIAGNOSIS — N401 Enlarged prostate with lower urinary tract symptoms: Secondary | ICD-10-CM

## 2017-04-15 DIAGNOSIS — K625 Hemorrhage of anus and rectum: Secondary | ICD-10-CM | POA: Diagnosis not present

## 2017-04-15 DIAGNOSIS — E349 Endocrine disorder, unspecified: Secondary | ICD-10-CM

## 2017-04-15 DIAGNOSIS — N319 Neuromuscular dysfunction of bladder, unspecified: Secondary | ICD-10-CM

## 2017-04-15 DIAGNOSIS — N138 Other obstructive and reflux uropathy: Secondary | ICD-10-CM | POA: Diagnosis not present

## 2017-04-16 ENCOUNTER — Telehealth: Payer: Self-pay

## 2017-04-16 DIAGNOSIS — E291 Testicular hypofunction: Secondary | ICD-10-CM

## 2017-04-16 LAB — HEPATIC FUNCTION PANEL
ALK PHOS: 109 IU/L (ref 39–117)
ALT: 6 IU/L (ref 0–44)
AST: 14 IU/L (ref 0–40)
Albumin: 4.3 g/dL (ref 3.6–4.8)
BILIRUBIN, DIRECT: 0.08 mg/dL (ref 0.00–0.40)
TOTAL PROTEIN: 6.7 g/dL (ref 6.0–8.5)

## 2017-04-16 NOTE — Telephone Encounter (Signed)
LMOM

## 2017-04-16 NOTE — Telephone Encounter (Signed)
-----   Message from Nori Riis, PA-C sent at 04/16/2017  8:02 AM EDT ----- Please let Mr. Souder know that his liver function is good.  We can start Clomid 50 mg, 1/2 tablet daily # 30 and we need to recheck a morning testosterone before 10 AM in one month.

## 2017-04-17 ENCOUNTER — Ambulatory Visit
Admission: RE | Admit: 2017-04-17 | Discharge: 2017-04-17 | Disposition: A | Discharge status: Home / Self Care | Payer: 59 | Source: Ambulatory Visit | Attending: Neurosurgery | Admitting: Neurosurgery

## 2017-04-17 DIAGNOSIS — R292 Abnormal reflex: Secondary | ICD-10-CM | POA: Insufficient documentation

## 2017-04-17 DIAGNOSIS — M5416 Radiculopathy, lumbar region: Secondary | ICD-10-CM

## 2017-04-17 HISTORY — DX: Atherosclerotic heart disease of native coronary artery without angina pectoris: I25.10

## 2017-04-17 LAB — GLUCOSE, CAPILLARY: GLUCOSE-CAPILLARY: 77 mg/dL (ref 65–99)

## 2017-04-17 LAB — APTT: APTT: 29 s (ref 24–36)

## 2017-04-17 LAB — PROTIME-INR
INR: 0.94
PROTHROMBIN TIME: 12.5 s (ref 11.4–15.2)

## 2017-04-17 MED ORDER — ACETAMINOPHEN 500 MG PO TABS
1000.0000 mg | ORAL_TABLET | Freq: Four times a day (QID) | ORAL | Status: DC | PRN
  Administered 2017-04-17: 1000 mg via ORAL
  Filled 2017-04-17: qty 2

## 2017-04-17 MED ORDER — LIDOCAINE HCL (PF) 1 % IJ SOLN
5.0000 mL | Freq: Once | INTRAMUSCULAR | Status: AC
  Administered 2017-04-17: 5 mL
  Filled 2017-04-17: qty 5

## 2017-04-17 MED ORDER — IOPAMIDOL (ISOVUE-200) INJECTION 41%
20.0000 mL | Freq: Once | INTRAVENOUS | Status: AC | PRN
  Administered 2017-04-17: 20 mL
  Filled 2017-04-17: qty 50

## 2017-04-17 MED ORDER — ACETAMINOPHEN 500 MG PO TABS
ORAL_TABLET | ORAL | Status: AC
  Filled 2017-04-17: qty 2

## 2017-04-17 NOTE — Progress Notes (Signed)
Pt provided I/O cath kit. Pt placed and drained catheter independently sitting on side of bed. 350 ml Amber clear urine voided.

## 2017-04-17 NOTE — Discharge Instructions (Signed)
Myelogram, Care After °These instructions give you information about caring for yourself after your procedure. Your doctor may also give you more specific instructions. Call your doctor if you have any problems or questions after your procedure. °Follow these instructions at home: °· Drink enough fluid to keep your pee (urine) clear or pale yellow. °· Rest as told by your doctor. °· Lie flat with your head slightly raised (elevated). °· Do not bend, lift, or do any hard activities for 24-48 hours or as told by your doctor. °· Take over-the-counter and prescription medicines only as told by your doctor. °· Take care of and remove your bandage (dressing) as told by your doctor. °· Bathe or shower as told by your doctor. °Contact a health care provider if: °· You have a fever. °· You have a headache that lasts longer than 24 hours. °· You feel sick to your stomach (nauseous). °· You throw up (vomit). °· Your neck is stiff. °· Your legs feel numb. °· You cannot pee. °· You cannot poop (have a bowel movement). °· You have a rash. °· You are itchy or sneezing. °Get help right away if: °· You have new symptoms or your symptoms get worse. °· You have a seizure. °· You have trouble breathing. °This information is not intended to replace advice given to you by your health care provider. Make sure you discuss any questions you have with your health care provider. °Document Released: 04/08/2008 Document Revised: 02/28/2016 Document Reviewed: 04/12/2015 °Elsevier Interactive Patient Education © 2018 [employer] ° °

## 2017-04-17 NOTE — Telephone Encounter (Signed)
LMOM

## 2017-04-20 MED ORDER — CLOMIPHENE CITRATE 50 MG PO TABS: ORAL_TABLET | ORAL | 0 refills | Status: DC

## 2017-04-20 NOTE — Telephone Encounter (Signed)
Line busy. Will send a letter and medication to pharmacy.

## 2017-05-04 ENCOUNTER — Ambulatory Visit: Payer: Medicare Other | Admitting: Psychiatry

## 2017-06-08 ENCOUNTER — Ambulatory Visit: Payer: Medicare Other | Admitting: Psychiatry

## 2017-06-09 ENCOUNTER — Encounter: Payer: Self-pay | Admitting: Psychiatry

## 2017-06-09 ENCOUNTER — Ambulatory Visit (INDEPENDENT_AMBULATORY_CARE_PROVIDER_SITE_OTHER): Payer: 59 | Admitting: Psychiatry

## 2017-06-09 ENCOUNTER — Other Ambulatory Visit: Payer: Self-pay

## 2017-06-09 VITALS — BP 118/67 | HR 89 | Temp 98.0°F | Wt 205.6 lb

## 2017-06-09 DIAGNOSIS — F331 Major depressive disorder, recurrent, moderate: Secondary | ICD-10-CM

## 2017-06-09 DIAGNOSIS — G894 Chronic pain syndrome: Secondary | ICD-10-CM | POA: Diagnosis not present

## 2017-06-09 MED ORDER — TRAZODONE HCL 150 MG PO TABS
150.0000 mg | ORAL_TABLET | Freq: Every day | ORAL | 1 refills | Status: DC
Start: 2017-06-09 — End: 2017-09-17

## 2017-06-09 MED ORDER — BUPROPION HCL ER (XL) 300 MG PO TB24: 300.0000 mg | ORAL_TABLET | Freq: Every day | ORAL | 1 refills | Status: DC

## 2017-06-09 MED ORDER — ESCITALOPRAM OXALATE 20 MG PO TABS: 20.0000 mg | ORAL_TABLET | Freq: Every day | ORAL | 1 refills | Status: DC

## 2017-06-09 NOTE — Progress Notes (Signed)
[place] MD/PA/NP OP Progress Note  06/09/2017 1:48 PM Todd Banks  MRN:  [id number]  Chief Complaint: "I am ok."  Chief Complaint    Follow-up; Medication Refill     HPI: Todd Banks is a 66 year old Caucasian male who presented to the clinic today for a follow-up visit. Todd Banks continues to struggle with some depressive symptoms.  Pt reports that he feels down most of the time.  He reports low energy on a regular basis.  He also reports some problem with his motivation.  He however reports that he has been doing some chores around the house, watches TV, tries to spend time with his wife.  He reports his appetite is fair.  He reports his sleep is okay on the trazodone.  He continues to be compliant on both Lexapro and Wellbutrin for his depressive symptoms.  He denies any side effects to the medications.  He does have a history of sexual dysfunction.  He reports that it started after his back surgery, and is likely a consequence of the surgery rather than a side effect of the current medications that he is on.  He however  reports that he tries to spend quality time with his wife and the relationship has been going okay.  Todd Banks reports that he continues to be in pain most of the time.  He is currently under pain management for the same.  He reports he tries to limit the use of pain pills as much as he can.  He denies misusing or abusing it.  He also denies other substance abuse or alcohol abuse at this time.    Visit Diagnosis:    ICD-10-CM   1. Major depressive disorder, recurrent episode, moderate (HCC) F33.1 escitalopram (LEXAPRO) 20 MG tablet    traZODone (DESYREL) 150 MG tablet    buPROPion (WELLBUTRIN XL) 300 MG 24 hr tablet  2. Chronic pain syndrome G89.4     Past Psychiatric History: History of past inpatient admissions to [facility].  Patient denies past suicide attempts.  Past trial of Zoloft.  Past Medical History:  Past Medical History:  Diagnosis Date  . Anemia   . Anxiety   .  Chronic back pain   . Constipation   . Coronary artery disease   . DDD (degenerative disc disease), lumbar   . Depression   . Diabetes mellitus without complication ([place])   . Diabetes mellitus, type II ([place])   . Dizziness   . Heart disease   . Hypertension   . Intermittent self-catheterization of bladder   . Kidney stones   . PUD (peptic ulcer disease)   . Shingles     Past Surgical History:  Procedure Laterality Date  . BACK SURGERY    . CARDIAC CATHETERIZATION N/A 08/28/2015   Procedure: Left Heart Cath and Coronary Angiography;  Surgeon: Isaias Cowman, MD;  Location: [place] [facility];  Service: Cardiovascular;  Laterality: N/A;  . CHOLECYSTECTOMY    . CORONARY ANGIOPLASTY    . CORONARY STENT PLACEMENT    . ESOPHAGOGASTRODUODENOSCOPY (EGD) WITH PROPOFOL N/A 09/29/2016   Procedure: ESOPHAGOGASTRODUODENOSCOPY (EGD) WITH PROPOFOL;  Surgeon: Manya Silvas, MD;  Location: [facility] ENDOSCOPY;  Service: Endoscopy;  Laterality: N/A;  . HAND SURGERY Right     Family Psychiatric History: Father-alcohol, son and daughter-depression.  Family History:  Family History  Problem Relation Age of Onset  . Diabetes Mother   . Heart disease Mother   . Hypertension Mother   . Depression Father   . Heart  disease Father   . Hypertension Father   . Prostate cancer Neg Hx   . Bladder Cancer Neg Hx   . Kidney cancer Neg Hx     Social History: He is married.  He is on SSI.  He has 4 children.  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 Social History   Socioeconomic History  . Marital status: Married    Spouse name: lynette  . Number of children: 4  . Years of education: None  . Highest education level: Some college, no degree  Social Needs  . Financial resource strain: Not hard at all  . Food insecurity - worry: Never true  . Food insecurity - inability: Never true  . Transportation needs - medical: No  . Transportation needs - non-medical:  No  Occupational History    Comment: disabled  Tobacco Use  . Smoking status: Former Smoker    Last attempt to quit: 07/22/1993    Years since quitting: 23.8  . Smokeless tobacco: Never Used  Substance and Sexual Activity  . Alcohol use: No    Alcohol/week: 0.0 oz  . Drug use: No  . Sexual activity: Not Currently  Other Topics Concern  . None  Social History Narrative  . None    Allergies:  Allergies  Allergen Reactions  . Cephalexin Hives    Other reaction(s): Unknown Other reaction(s): Unknown  . Cephalosporins Swelling    Other Reaction: reacts to Keflex  . Ampicillin Swelling    Other reaction(s): Unknown  . Cefazolin Swelling    Other reaction(s): Other (qualifier value)  . Cefuroxime Axetil Swelling  . Ciprofloxacin Swelling    Other reaction(s): Respiratory distress (finding), Weal  . Clindamycin Swelling  . Doxycycline Swelling    Other reaction(s): Unknown  . Erythromycin Swelling    Other reaction(s): Unknown  . Latex Other (See Comments)    Other reaction(s): BLISTERS  . Levofloxacin Swelling    Other reaction(s): Altered mental status (finding), Itching of Skin  . Other Swelling    All Mycins..  . Penicillins Swelling  . Pork (Porcine) Protein     Other reaction(s): Other (qualifier value)  . Prevacid [Lansoprazole]     Other reaction(s): Unknown Other reaction(s): Unknown    Metabolic Disorder Labs: No results found for: HGBA1C, MPG Lab Results  Component Value Date   PROLACTIN 19.0 (H) 03/26/2017   No results found for: CHOL, TRIG, HDL, CHOLHDL, VLDL, LDLCALC No results found for: TSH  Therapeutic Level Labs: No results found for: LITHIUM No results found for: VALPROATE No components found for:  CBMZ  Current Medications: Current Outpatient Medications  Medication Sig Dispense Refill  . aspirin 325 MG tablet Take 325 mg by mouth daily.    . clomiPHENE (CLOMID) 50 MG tablet 1/2 tab daily 30 tablet 0  . cyanocobalamin (,VITAMIN B-12,)  1000 MCG/ML injection     . docusate sodium (COLACE) 50 MG capsule Take by mouth.    . escitalopram (LEXAPRO) 20 MG tablet Take 1 tablet (20 mg total) by mouth daily. 90 tablet 1  . gemfibrozil (LOPID) 600 MG tablet Take 600 mg by mouth 2 (two) times daily before a meal.    . glucose blood (ONE TOUCH ULTRA TEST) test strip Use as instructed 5 times daily.    . insulin glargine (LANTUS) 100 UNIT/ML injection Inject 70 Units into the skin at bedtime.     . insulin lispro (HUMALOG) 100 UNIT/ML injection 15  Units 3 (three) times daily before meals. Sliding scale    . Insulin Pen Needle (EXEL COMFORT POINT PEN NEEDLE) 29G X 12MM MISC Use one four times daily.    . isosorbide dinitrate (ISORDIL) 30 MG tablet Take 30 mg by mouth 2 (two) times daily.    . isosorbide mononitrate (IMDUR) 30 MG 24 hr tablet     . metFORMIN (GLUCOPHAGE) 1000 MG tablet Take 1,000 mg by mouth 2 (two) times daily with a meal.    . metoprolol succinate (TOPROL-XL) 25 MG 24 hr tablet Take 25 mg by mouth daily.    Marland Kitchen NEEDLE, DISP, 25 G (BD SAFETYGLIDE SHIELDED NEEDLE) 25G X 1" MISC Use 1 each monthly. for B12 injections    . nitroGLYCERIN (NITROSTAT) 0.4 MG SL tablet Place under the tongue.    Marland Kitchen omeprazole (PRILOSEC) 20 MG capsule Take 20 mg by mouth 2 (two) times daily before a meal.    . orphenadrine (NORFLEX) 100 MG tablet Limit  1 tab po / day or bid  If tolerated 60 tablet 2  . oxyCODONE (OXY IR/ROXICODONE) 5 MG immediate release tablet LIMIT 1 TAB BY MOUTH 2 TO 4 TIMES PER DAY FOR BREAKTHROUGH PAIN WHILE TAKING METHADONE IF TOLERATED  0  . prasugrel (EFFIENT) 10 MG TABS tablet Take 10 mg by mouth daily.    . pravastatin (PRAVACHOL) 40 MG tablet Take 40 mg by mouth daily.    Marland Kitchen RANEXA 500 MG 12 hr tablet Take 500 mg by mouth 2 (two) times daily.  0  . SYRINGE-NEEDLE, DISP, 3 ML (MEDSAVER SYRINGE 3CC/23GX1") 23G X 1" 3 ML MISC Use as directed with  cyanocobalamin    . traZODone (DESYREL) 150 MG tablet Take 1 tablet (150 mg  total) by mouth at bedtime. 90 tablet 1  . buPROPion (WELLBUTRIN XL) 300 MG 24 hr tablet Take 1 tablet (300 mg total) by mouth daily. 90 tablet 1  . pioglitazone (ACTOS) 15 MG tablet Take by mouth.     No current facility-administered medications for this visit.      Musculoskeletal: Strength & Muscle Tone: within normal limits Gait & Station: normal Patient leans: N/A  Psychiatric Specialty Exam: Review of Systems  Musculoskeletal: Positive for back pain (managed with pain meds).  Psychiatric/Behavioral: Positive for depression. Negative for hallucinations, substance abuse and suicidal ideas. The patient is not nervous/anxious and does not have insomnia.   All other systems reviewed and are negative.   Blood pressure 118/67, pulse 89, temperature 98 F (36.7 C), temperature source Oral, weight 205 lb 9.6 oz (93.3 kg).Body mass index is 31.26 kg/m.  General Appearance: Casual  Eye Contact:  Fair  Speech:  Clear and Coherent  Volume:  Normal  Mood:  Dysphoric  Affect:  Congruent  Thought Process:  Goal Directed and Descriptions of Associations: Intact  Orientation:  Full (Time, Place, and Person)  Thought Content: Logical   Suicidal Thoughts:  No  Homicidal Thoughts:  No  Memory:  Immediate;   Fair Recent;   Fair Remote;   Fair  Judgement:  Fair  Insight:  Fair  Psychomotor Activity:  Normal  Concentration:  Concentration: Fair and Attention Span: Fair  Recall:  [employer] of Knowledge: Fair  Language: Fair  Akathisia:  No  Handed:  Right  AIMS (if indicated): NA  Assets:  Communication Skills Desire for Improvement Housing Social Support Talents/Skills Transportation  ADL's:  Intact  Cognition: WNL  Sleep:  Fair   Screenings: PHQ2-9  Office Visit from 03/04/2016 in [place] Procedure visit from 01/21/2016 in [place] Clinical Support from 12/31/2015 in [place] Office Visit from 11/29/2015 in [place] Clinical Support from 10/04/2015 in [facility]  PHQ-2 Total Score  0  0  0  0  0       Assessment and Plan: Todd Banks is a 70 y old CM who has a hx of depression as well as chronic pain , who presented to the clinic here for a follow up visit. Todd Banks continues to report some depressive sx. He agrees with increasing his medications , will continue to make medication changes as noted below.  Plan For depression Increase Wellbutrin XL to 300 mg po daily. Continue lexapro 20 mg po daily. PHQ9 =3 . However he reported some continued depressive sx as per clinical interview.  For insomnia Trazodone 150 mg po daily.  For chronic pain He continues to follow up with pain management. Based on clinical interview as well as screening tools-the risk of substance abuse potential is low.  However this judgment regarding the risk of addiction/aberrant medication use carries the caution that it is a probabilistic statement constructed by the inherent limitations of both the clinical data and the scientific researches upon which it is based, and therefore cannot be definitive. Screening tools used are - Clinical interview                                             Screener and opioid assessment for patients with pain- SOAPP                                              Drug abuse screening Test - DAST                                              CAGE questionnaire                                              Opioid Risk Tool - ORT  Will release evaluation notes to Dr.Crisp per patient request and he has signed a consent.  Follow up in 2 months or sooner if needed.  More than 50 % of the time was spent for psychoeducation and supportive psychotherapy and care coordination.  This note was generated in part or whole with voice  recognition software. Voice recognition is usually quite accurate but there are transcription errors that can and very often do occur. I apologize for any typographical errors that were not detected and corrected.       Ursula Alert, MD 06/09/2017, 1:48 PM

## 2017-08-06 ENCOUNTER — Encounter: Payer: Self-pay | Admitting: *Deleted

## 2017-08-07 ENCOUNTER — Encounter
Admission: RE | Disposition: A | Discharge status: Home / Self Care | Payer: Self-pay | Source: Ambulatory Visit | Attending: Unknown Physician Specialty

## 2017-08-07 ENCOUNTER — Ambulatory Visit: Payer: 59 | Admitting: Anesthesiology

## 2017-08-07 ENCOUNTER — Ambulatory Visit: Payer: Medicare Other | Admitting: Psychiatry

## 2017-08-07 ENCOUNTER — Ambulatory Visit
Admission: RE | Admit: 2017-08-07 | Discharge: 2017-08-07 | Disposition: A | Discharge status: Home / Self Care | Payer: 59 | Source: Ambulatory Visit | Attending: Unknown Physician Specialty | Admitting: Unknown Physician Specialty

## 2017-08-07 DIAGNOSIS — M5136 Other intervertebral disc degeneration, lumbar region: Secondary | ICD-10-CM | POA: Diagnosis not present

## 2017-08-07 DIAGNOSIS — K219 Gastro-esophageal reflux disease without esophagitis: Secondary | ICD-10-CM | POA: Insufficient documentation

## 2017-08-07 DIAGNOSIS — Z09 Encounter for follow-up examination after completed treatment for conditions other than malignant neoplasm: Secondary | ICD-10-CM | POA: Insufficient documentation

## 2017-08-07 DIAGNOSIS — Z888 Allergy status to other drugs, medicaments and biological substances status: Secondary | ICD-10-CM | POA: Insufficient documentation

## 2017-08-07 DIAGNOSIS — Z7982 Long term (current) use of aspirin: Secondary | ICD-10-CM | POA: Insufficient documentation

## 2017-08-07 DIAGNOSIS — Z87891 Personal history of nicotine dependence: Secondary | ICD-10-CM | POA: Diagnosis not present

## 2017-08-07 DIAGNOSIS — Z91040 Latex allergy status: Secondary | ICD-10-CM | POA: Diagnosis not present

## 2017-08-07 DIAGNOSIS — Z880 Allergy status to penicillin: Secondary | ICD-10-CM | POA: Insufficient documentation

## 2017-08-07 DIAGNOSIS — I119 Hypertensive heart disease without heart failure: Secondary | ICD-10-CM | POA: Diagnosis not present

## 2017-08-07 DIAGNOSIS — Z79899 Other long term (current) drug therapy: Secondary | ICD-10-CM | POA: Insufficient documentation

## 2017-08-07 DIAGNOSIS — K5900 Constipation, unspecified: Secondary | ICD-10-CM | POA: Diagnosis not present

## 2017-08-07 DIAGNOSIS — Z91018 Allergy to other foods: Secondary | ICD-10-CM | POA: Insufficient documentation

## 2017-08-07 DIAGNOSIS — Z955 Presence of coronary angioplasty implant and graft: Secondary | ICD-10-CM | POA: Insufficient documentation

## 2017-08-07 DIAGNOSIS — Z79891 Long term (current) use of opiate analgesic: Secondary | ICD-10-CM | POA: Diagnosis not present

## 2017-08-07 DIAGNOSIS — I2510 Atherosclerotic heart disease of native coronary artery without angina pectoris: Secondary | ICD-10-CM | POA: Insufficient documentation

## 2017-08-07 DIAGNOSIS — F419 Anxiety disorder, unspecified: Secondary | ICD-10-CM | POA: Diagnosis not present

## 2017-08-07 DIAGNOSIS — K2970 Gastritis, unspecified, without bleeding: Secondary | ICD-10-CM | POA: Insufficient documentation

## 2017-08-07 DIAGNOSIS — Z794 Long term (current) use of insulin: Secondary | ICD-10-CM | POA: Insufficient documentation

## 2017-08-07 DIAGNOSIS — R897 Abnormal histological findings in specimens from other organs, systems and tissues: Secondary | ICD-10-CM | POA: Insufficient documentation

## 2017-08-07 DIAGNOSIS — E119 Type 2 diabetes mellitus without complications: Secondary | ICD-10-CM | POA: Insufficient documentation

## 2017-08-07 DIAGNOSIS — F329 Major depressive disorder, single episode, unspecified: Secondary | ICD-10-CM | POA: Diagnosis not present

## 2017-08-07 DIAGNOSIS — Z881 Allergy status to other antibiotic agents status: Secondary | ICD-10-CM | POA: Insufficient documentation

## 2017-08-07 DIAGNOSIS — G8929 Other chronic pain: Secondary | ICD-10-CM | POA: Diagnosis not present

## 2017-08-07 DIAGNOSIS — M549 Dorsalgia, unspecified: Secondary | ICD-10-CM | POA: Insufficient documentation

## 2017-08-07 DIAGNOSIS — Z1211 Encounter for screening for malignant neoplasm of colon: Secondary | ICD-10-CM | POA: Diagnosis present

## 2017-08-07 DIAGNOSIS — K259 Gastric ulcer, unspecified as acute or chronic, without hemorrhage or perforation: Secondary | ICD-10-CM | POA: Diagnosis present

## 2017-08-07 DIAGNOSIS — Z8711 Personal history of peptic ulcer disease: Secondary | ICD-10-CM | POA: Diagnosis not present

## 2017-08-07 HISTORY — PX: ESOPHAGOGASTRODUODENOSCOPY (EGD) WITH PROPOFOL: SHX5813

## 2017-08-07 HISTORY — PX: COLONOSCOPY WITH PROPOFOL: SHX5780

## 2017-08-07 LAB — GLUCOSE, CAPILLARY: GLUCOSE-CAPILLARY: 104 mg/dL — AB (ref 65–99)

## 2017-08-07 SURGERY — COLONOSCOPY WITH PROPOFOL
Anesthesia: General

## 2017-08-07 MED ORDER — PROPOFOL 500 MG/50ML IV EMUL
INTRAVENOUS | Status: DC | PRN
  Administered 2017-08-07: 160 ug/kg/min via INTRAVENOUS

## 2017-08-07 MED ORDER — MIDAZOLAM HCL 2 MG/2ML IJ SOLN
INTRAMUSCULAR | Status: DC | PRN
  Administered 2017-08-07: 1 mg via INTRAVENOUS

## 2017-08-07 MED ORDER — LIDOCAINE HCL (PF) 1 % IJ SOLN: 2.0000 mL | Freq: Once | INTRAMUSCULAR | Status: DC

## 2017-08-07 MED ORDER — LIDOCAINE HCL (PF) 1 % IJ SOLN
INTRAMUSCULAR | Status: AC
  Filled 2017-08-07: qty 2

## 2017-08-07 MED ORDER — LIDOCAINE HCL (PF) 2 % IJ SOLN
INTRAMUSCULAR | Status: AC
  Filled 2017-08-07: qty 10

## 2017-08-07 MED ORDER — PROPOFOL 500 MG/50ML IV EMUL
INTRAVENOUS | Status: AC
  Filled 2017-08-07: qty 50

## 2017-08-07 MED ORDER — PROPOFOL 10 MG/ML IV BOLUS
INTRAVENOUS | Status: DC | PRN
  Administered 2017-08-07: 20 mg via INTRAVENOUS
  Administered 2017-08-07: 40 mg via INTRAVENOUS

## 2017-08-07 MED ORDER — FENTANYL CITRATE (PF) 100 MCG/2ML IJ SOLN
INTRAMUSCULAR | Status: AC
Start: 2017-08-07 — End: 2017-08-07
  Filled 2017-08-07: qty 2

## 2017-08-07 MED ORDER — FENTANYL CITRATE (PF) 100 MCG/2ML IJ SOLN: INTRAMUSCULAR | Status: DC | PRN

## 2017-08-07 MED ORDER — FENTANYL CITRATE (PF) 100 MCG/2ML IJ SOLN
INTRAMUSCULAR | Status: DC | PRN
  Administered 2017-08-07: 50 ug via INTRAVENOUS

## 2017-08-07 MED ORDER — PHENYLEPHRINE HCL 10 MG/ML IJ SOLN
INTRAMUSCULAR | Status: DC | PRN
  Administered 2017-08-07: 150 ug via INTRAVENOUS
  Administered 2017-08-07: 100 ug via INTRAVENOUS

## 2017-08-07 MED ORDER — MIDAZOLAM HCL 2 MG/2ML IJ SOLN
INTRAMUSCULAR | Status: AC
  Filled 2017-08-07: qty 2

## 2017-08-07 MED ORDER — SODIUM CHLORIDE 0.9 % IV SOLN: INTRAVENOUS | Status: DC

## 2017-08-07 MED ORDER — LIDOCAINE HCL (CARDIAC) 20 MG/ML IV SOLN
INTRAVENOUS | Status: DC | PRN
  Administered 2017-08-07: 100 mg via INTRATRACHEAL

## 2017-08-07 MED ORDER — SODIUM CHLORIDE 0.9 % IV SOLN
INTRAVENOUS | Status: DC
  Administered 2017-08-07: 08:00:00 via INTRAVENOUS
  Administered 2017-08-07: 0.3 mL via INTRAVENOUS

## 2017-08-07 MED ORDER — EPHEDRINE SULFATE 50 MG/ML IJ SOLN
INTRAMUSCULAR | Status: DC | PRN
  Administered 2017-08-07 (×2): 10 mg via INTRAVENOUS

## 2017-08-07 NOTE — Transfer of Care (Signed)
Immediate Anesthesia Transfer of Care Note  Patient: Todd Banks  Procedure(s) Performed: COLONOSCOPY WITH PROPOFOL (N/A ) ESOPHAGOGASTRODUODENOSCOPY (EGD) WITH PROPOFOL (N/A )  Patient Location: PACU  Anesthesia Type:General  Level of Consciousness: awake  Airway & Oxygen Therapy: Patient Spontanous Breathing and Patient connected to nasal cannula oxygen  Post-op Assessment: Report given to RN and Post -op Vital signs reviewed and stable  Post vital signs: Reviewed and stable  Last Vitals:  Vitals:   08/07/17 0702 08/07/17 0810  BP: 117/65 112/69  Pulse: 79 80  Resp: 16 17  Temp: (!) 36.2 C (!) 36.4 C  SpO2: 98% 98%    Last Pain:  Vitals:   08/07/17 0810  TempSrc: Tympanic  PainSc:          Complications: No apparent anesthesia complications

## 2017-08-07 NOTE — Op Note (Signed)
[facility] Gastroenterology Patient Name: Todd Banks Procedure Date: 08/07/2017 7:37 AM MRN: [id number] Account #: [id number] Date of Birth: 1950/10/30 Admit Type: Outpatient Age: 67 Room: [facility] ENDO ROOM 3 Gender: Male Note Status: Finalized Procedure:            Upper GI endoscopy Indications:          Follow-up of acute gastric ulcer Providers:            Manya Silvas, MD Referring MD:         Irven Easterly. Kary Kos, MD (Referring MD) Medicines:            Propofol per Anesthesia Complications:        No immediate complications. Procedure:            Pre-Anesthesia Assessment:                       - After reviewing the risks and benefits, the patient                        was deemed in satisfactory condition to undergo the                        procedure.                       After obtaining informed consent, the endoscope was                        passed under direct vision. Throughout the procedure,                        the patient's blood pressure, pulse, and oxygen                        saturations were monitored continuously. The                        Colonoscope was introduced through the mouth, and                        advanced to the second part of duodenum. The upper GI                        endoscopy was accomplished without difficulty. The                        patient tolerated the procedure well. Findings:      The examined esophagus was normal. GEJ 40cm      Patchy mildly erythematous mucosa without bleeding was found in the       gastric antrum. Biopsies were taken with a cold forceps for histology.       Biopsies were taken with a cold forceps for Helicobacter pylori testing.       No evidence of any ulcer remaining anywhere.      The examined duodenum was normal. Impression:           - Normal esophagus.                       - Erythematous mucosa in the antrum. Biopsied.                       -  Normal examined  duodenum. Recommendation:       - Await pathology results. Manya Silvas, MD 08/07/2017 7:51:39 AM This report has been signed electronically. Number of Addenda: 0 Note Initiated On: 08/07/2017 7:37 AM      [facility]

## 2017-08-07 NOTE — Anesthesia Postprocedure Evaluation (Signed)
Anesthesia Post Note  Patient: Todd Banks  Procedure(s) Performed: COLONOSCOPY WITH PROPOFOL (N/A ) ESOPHAGOGASTRODUODENOSCOPY (EGD) WITH PROPOFOL (N/A )  Patient location during evaluation: Endoscopy Anesthesia Type: General Level of consciousness: awake and alert Pain management: pain level controlled Vital Signs Assessment: post-procedure vital signs reviewed and stable Respiratory status: spontaneous breathing and respiratory function stable Cardiovascular status: stable Anesthetic complications: no     Last Vitals:  Vitals:   08/07/17 0702 08/07/17 0810  BP: 117/65 112/69  Pulse: 79 80  Resp: 16 17  Temp: (!) 36.2 C (!) 36.4 C  SpO2: 98% 98%    Last Pain:  Vitals:   08/07/17 0810  TempSrc: Tympanic  PainSc:                  Chenee Munns K

## 2017-08-07 NOTE — H&P (Signed)
Primary Care Physician:  Maryland Pink, MD Primary Gastroenterologist:  Dr. Vira Agar  Pre-Procedure History & Physical: HPI:  Todd Banks is a 67 y.o. male is here for an endoscopy and colonoscopy.   Past Medical History:  Diagnosis Date  . Anemia   . Anxiety   . Chronic back pain   . Constipation   . Coronary artery disease   . DDD (degenerative disc disease), lumbar   . Depression   . Diabetes mellitus without complication ([place])   . Diabetes mellitus, type II ([place])   . Dizziness   . Heart disease   . Hypertension   . Intermittent self-catheterization of bladder   . Kidney stones   . PUD (peptic ulcer disease)   . Shingles     Past Surgical History:  Procedure Laterality Date  . BACK SURGERY    . CARDIAC CATHETERIZATION N/A 08/28/2015   Procedure: Left Heart Cath and Coronary Angiography;  Surgeon: Isaias Cowman, MD;  Location: [place] [facility];  Service: Cardiovascular;  Laterality: N/A;  . CHOLECYSTECTOMY    . CORONARY ANGIOPLASTY    . CORONARY STENT PLACEMENT    . ESOPHAGOGASTRODUODENOSCOPY (EGD) WITH PROPOFOL N/A 09/29/2016   Procedure: ESOPHAGOGASTRODUODENOSCOPY (EGD) WITH PROPOFOL;  Surgeon: Manya Silvas, MD;  Location: [facility] ENDOSCOPY;  Service: Endoscopy;  Laterality: N/A;  . HAND SURGERY Right     Prior to Admission medications   Medication Sig Start Date End Date Taking? Authorizing Provider  aspirin 325 MG tablet Take 325 mg by mouth daily.    [provider]  buPROPion (WELLBUTRIN XL) 300 MG 24 hr tablet Take 1 tablet (300 mg total) by mouth daily. 06/09/17   Ursula Alert, MD  clomiPHENE (CLOMID) 50 MG tablet 1/2 tab daily 04/20/17   [name] A, PA-C  cyanocobalamin (,VITAMIN B-12,) 1000 MCG/ML injection  12/28/15   [provider]  docusate sodium (COLACE) 50 MG capsule Take by mouth.    [provider]  escitalopram (LEXAPRO) 20 MG tablet Take 1 tablet (20 mg total) by mouth daily. 06/09/17   Ursula Alert, MD  gemfibrozil (LOPID) 600 MG tablet Take 600 mg by mouth 2 (two) times daily before a meal.    [provider]  glucose blood (ONE TOUCH ULTRA TEST) test strip Use as instructed 5 times daily. 02/16/14   [provider]  insulin glargine (LANTUS) 100 UNIT/ML injection Inject 70 Units into the skin at bedtime.     [provider]  insulin lispro (HUMALOG) 100 UNIT/ML injection 15 Units 3 (three) times daily before meals. Sliding scale    [provider]  Insulin Pen Needle (EXEL COMFORT POINT PEN NEEDLE) 29G X 12MM MISC Use one four times daily. 09/30/15   [provider]  isosorbide dinitrate (ISORDIL) 30 MG tablet Take 30 mg by mouth 2 (two) times daily.    [provider]  isosorbide mononitrate (IMDUR) 30 MG 24 hr tablet  02/05/16   [provider]  metFORMIN (GLUCOPHAGE) 1000 MG tablet Take 1,000 mg by mouth 2 (two) times daily with a meal.    [provider]  metoprolol succinate (TOPROL-XL) 25 MG 24 hr tablet Take 25 mg by mouth daily.    [provider]  NEEDLE, DISP, 25 G (BD SAFETYGLIDE SHIELDED NEEDLE) 25G X 1" MISC Use 1 each monthly. for B12 injections 06/22/15   [provider]  nitroGLYCERIN (NITROSTAT) 0.4 MG SL tablet Place under the tongue. 09/04/15   [provider]  omeprazole (PRILOSEC) 20 MG capsule Take 20 mg by mouth 2 (two) times daily before a meal.    [provider]  orphenadrine (NORFLEX) 100 MG tablet Limit  1 tab po / day or bid  If tolerated 03/04/16   Mohammed Kindle, MD  oxyCODONE (OXY IR/ROXICODONE) 5 MG immediate release tablet LIMIT 1 TAB BY MOUTH 2 TO 4 TIMES PER DAY FOR BREAKTHROUGH PAIN WHILE TAKING METHADONE IF TOLERATED 12/19/15   [provider]  pioglitazone (ACTOS) 15 MG tablet Take by mouth. 09/30/15 09/29/16  [provider]  prasugrel (EFFIENT) 10 MG TABS tablet Take 10 mg by mouth daily.    [provider]  pravastatin  (PRAVACHOL) 40 MG tablet Take 40 mg by mouth daily.    [provider]  RANEXA 500 MG 12 hr tablet Take 500 mg by mouth 2 (two) times daily. 12/18/16   [provider]  SYRINGE-NEEDLE, DISP, 3 ML (MEDSAVER SYRINGE 3CC/23GX1") 23G X 1" 3 ML MISC Use as directed with  cyanocobalamin 12/31/15   [provider]  traZODone (DESYREL) 150 MG tablet Take 1 tablet (150 mg total) by mouth at bedtime. 06/09/17   Ursula Alert, MD    Allergies as of 06/01/2017 - Review Complete 04/17/2017  Allergen Reaction Noted  . Cephalexin Hives 10/24/2014  . Cephalosporins Swelling 11/20/2014  . Ampicillin Swelling 10/24/2014  . Cefazolin Swelling 10/24/2014  . Cefuroxime axetil Swelling 11/20/2014  . Ciprofloxacin Swelling 10/24/2014  . Clindamycin Swelling 11/20/2014  . Doxycycline Swelling 10/24/2014  . Erythromycin Swelling 10/24/2014  . Latex Other (See Comments) 10/24/2014  . Levofloxacin Swelling 10/24/2014  . Other Swelling 11/20/2014  . Penicillins Swelling 11/20/2014  . Pork (porcine) protein  10/24/2014  . Prevacid [lansoprazole]  10/24/2014    Family History  Problem Relation Age of Onset  . Diabetes Mother   . Heart disease Mother   . Hypertension Mother   . Depression Father   . Heart disease Father   . Hypertension Father   . Prostate cancer Neg Hx   . Bladder Cancer Neg Hx   . Kidney cancer Neg Hx     Social History   Socioeconomic History  . Marital status: Married    Spouse name: lynette  . Number of children: 4  . Years of education: Not on file  . Highest education level: Some college, no degree  Social Needs  . Financial resource strain: Not hard at all  . Food insecurity - worry: Never true  . Food insecurity - inability: Never true  . Transportation needs - medical: No  . Transportation needs - non-medical: No  Occupational History    Comment: disabled  Tobacco Use  . Smoking status: Former Smoker    Last attempt to quit: 07/22/1993     Years since quitting: 24.0  . Smokeless tobacco: Never Used  Substance and Sexual Activity  . Alcohol use: No    Alcohol/week: 0.0 oz  . Drug use: No  . Sexual activity: Not Currently  Other Topics Concern  . Not on file  Social History Narrative  . Not on file    Review of Systems: See HPI, otherwise negative ROS  Physical Exam: BP 117/65   Pulse 79   Temp (!) 97.2 F (36.2 C) (Tympanic)   Resp 16   Ht 5\' 8"  (1.727 m)   Wt 93 kg (205 lb)   SpO2 98%   BMI 31.17 kg/m  General:   Alert,  pleasant and cooperative  in NAD Head:  Normocephalic and atraumatic. Neck:  Supple; no masses or thyromegaly. Lungs:  Clear throughout to auscultation.    Heart:  Regular rate and rhythm. Abdomen:  Soft, nontender and nondistended. Normal bowel sounds, without guarding, and without rebound.   Neurologic:  Alert and  oriented x4;  grossly normal neurologically.  Impression/Plan: Todd Banks is here for an endoscopy and colonoscopy to be performed for screening colonoscopy and follow up gastric ulcer.  Risks, benefits, limitations, and alternatives regarding  endoscopy and colonoscopy have been reviewed with the patient.  Questions have been answered.  All parties agreeable.   Gaylyn Cheers, MD  08/07/2017, 7:31 AM

## 2017-08-07 NOTE — Anesthesia Post-op Follow-up Note (Signed)
Anesthesia QCDR form completed.        

## 2017-08-07 NOTE — Op Note (Signed)
[facility] Patient Name: Todd Banks Procedure Date: 08/07/2017 7:36 AM MRN: [id number] Account #: [id number] Date of Birth: [birth year] Admit Type: Outpatient Age: 67 Room: [facility] ENDO ROOM 3 Gender: Male Note Status: Finalized Procedure:            Colonoscopy Providers:            Manya Silvas, MD Referring MD:         Irven Easterly. Kary Kos, MD (Referring MD) Medicines:            Propofol per Anesthesia Complications:        No immediate complications. Procedure:            Pre-Anesthesia Assessment:                       - After reviewing the risks and benefits, the patient                        was deemed in satisfactory condition to undergo the                        procedure.                       After obtaining informed consent, the colonoscope was                        passed under direct vision. Throughout the procedure,                        the patient's blood pressure, pulse, and oxygen                        saturations were monitored continuously. The                        Colonoscope was introduced through the anus and                        advanced to the the cecum, identified by appendiceal                        orifice and ileocecal valve. The colonoscopy was                        performed without difficulty. The patient tolerated the                        procedure well. The quality of the bowel preparation                        was adequate to identify polyps. Findings:      A diminutive polyp was found in the sigmoid colon. The polyp was       sessile. The polyp was removed with a jumbo cold forceps. Resection and       retrieval were complete.      The exam was otherwise without abnormality. Impression:           - One diminutive polyp in the sigmoid colon, removed  with a jumbo cold forceps. Resected and retrieved.                       - The examination was otherwise  normal. Recommendation:       - Await pathology results. Manya Silvas, MD 08/07/2017 8:10:21 AM This report has been signed electronically. Number of Addenda: 0 Note Initiated On: 08/07/2017 7:36 AM Scope Withdrawal Time: 0 hours 9 minutes 18 seconds  Total Procedure Duration: 0 hours 15 minutes 9 seconds       [facility]

## 2017-08-07 NOTE — Anesthesia Procedure Notes (Signed)
Date/Time: 08/07/2017 7:35 AM Performed by: Allean Found, CRNA Pre-anesthesia Checklist: Patient identified, Emergency Drugs available, Suction available, Patient being monitored and Timeout performed Patient Re-evaluated:Patient Re-evaluated prior to induction Oxygen Delivery Method: Nasal cannula Placement Confirmation: positive ETCO2 Dental Injury: Teeth and Oropharynx as per pre-operative assessment

## 2017-08-07 NOTE — Anesthesia Preprocedure Evaluation (Signed)
Anesthesia Evaluation  Patient identified by MRN, date of birth, ID band Patient awake    Reviewed: Allergy & Precautions, NPO status , Patient's Chart, lab work & pertinent test results, reviewed documented beta blocker date and time   History of Anesthesia Complications Negative for: history of anesthetic complications  Airway Mallampati: III       Dental  (+) Missing, Chipped   Pulmonary neg sleep apnea, neg COPD, former smoker,           Cardiovascular hypertension, Pt. on medications and Pt. on home beta blockers + CAD and + Cardiac Stents  (-) dysrhythmias (-) Valvular Problems/Murmurs     Neuro/Psych Anxiety Depression    GI/Hepatic Neg liver ROS, PUD, GERD  Medicated and Controlled,  Endo/Other  diabetes, Type 2, Oral Hypoglycemic Agents, Insulin Dependent  Renal/GU Renal disease (stones)     Musculoskeletal   Abdominal   Peds  Hematology  (+) anemia ,   Anesthesia Other Findings   Reproductive/Obstetrics                             Anesthesia Physical Anesthesia Plan  ASA: III  Anesthesia Plan: General   Post-op Pain Management:    Induction: Intravenous  PONV Risk Score and Plan: 2 and Propofol infusion and TIVA  Airway Management Planned: Nasal Cannula  Additional Equipment:   Intra-op Plan:   Post-operative Plan:   Informed Consent: I have reviewed the patients History and Physical, chart, labs and discussed the procedure including the risks, benefits and alternatives for the proposed anesthesia with the patient or authorized representative who has indicated his/her understanding and acceptance.     Plan Discussed with:   Anesthesia Plan Comments:         Anesthesia Quick Evaluation

## 2017-08-10 ENCOUNTER — Encounter: Payer: Self-pay | Admitting: Unknown Physician Specialty

## 2017-08-10 LAB — SURGICAL PATHOLOGY

## 2017-08-26 IMAGING — CT CT HEAD W/O CM
4 series · 17 of 47 positions shown, 19 images · non-contrast
Comparison: MRI 06/26/2009

CLINICAL DATA: Fell 2 days ago with trauma to the forehead. Anti
coagulated.

EXAM:
CT HEAD WITHOUT CONTRAST
TECHNIQUE: Contiguous axial images were obtained from the base of the skull
through the vertex without intravenous contrast.

[Series 2: head wo · axial · 0.42mm/px · z∈[+224,+334]mm · 7 of 30 slices shown, 9 images]
[im 4/30  brain]
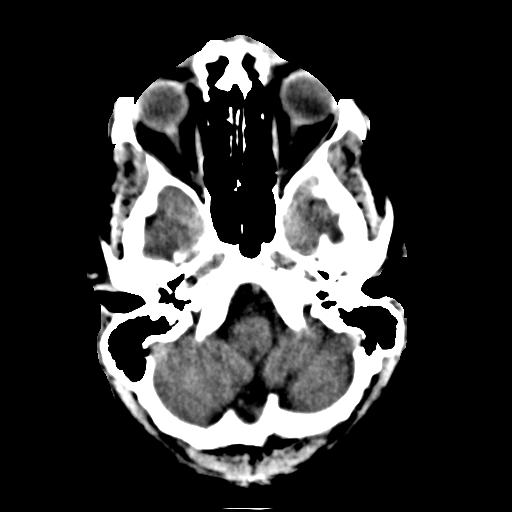
[im 4/30  bone]
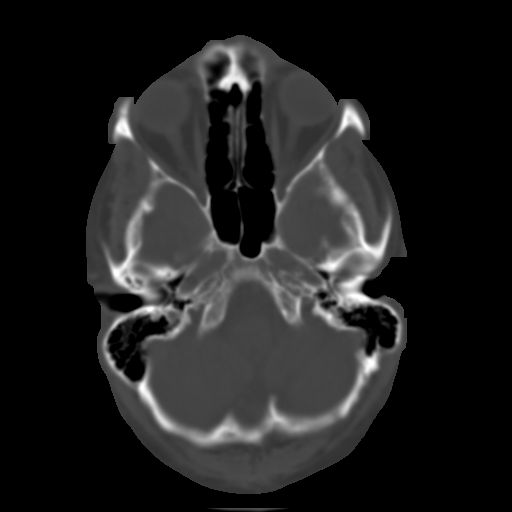
[im 8/30  brain]
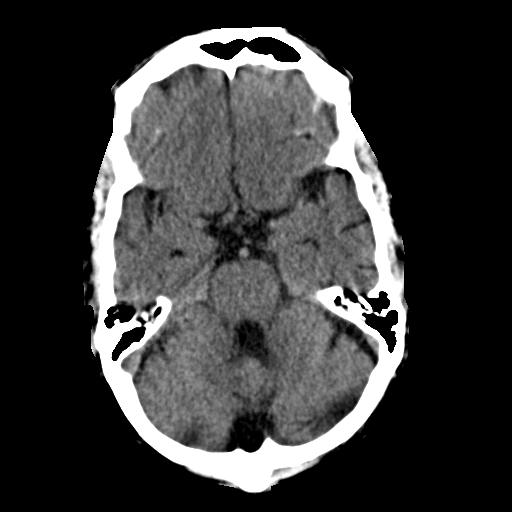
[im 11/30  brain]
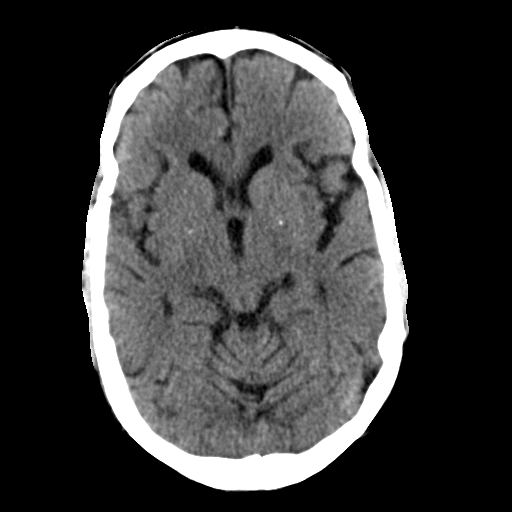
[im 15/30  brain]
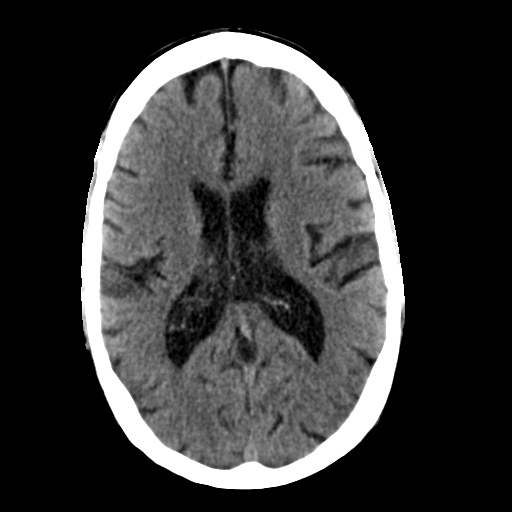
[im 19/30  brain]
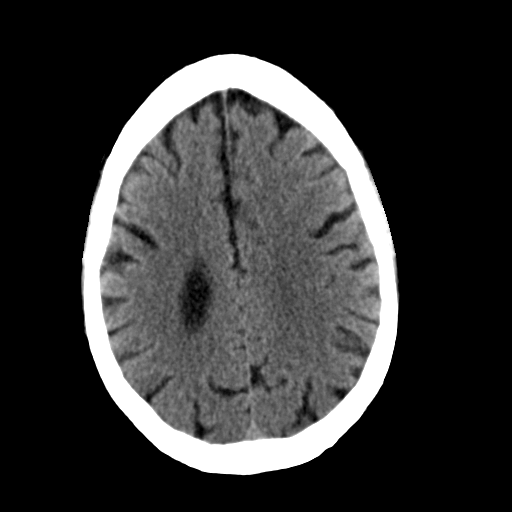
[im 19/30  bone]
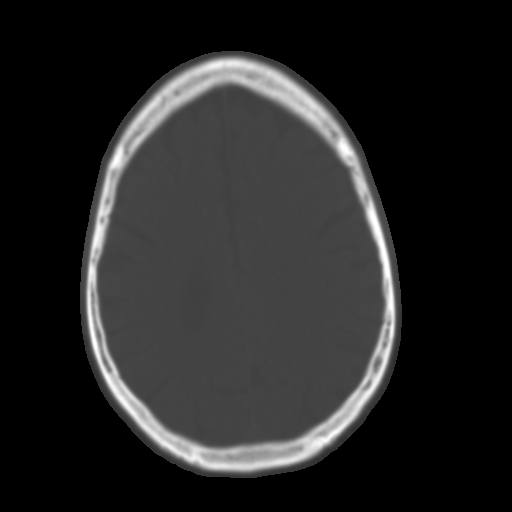
[im 22/30  brain]
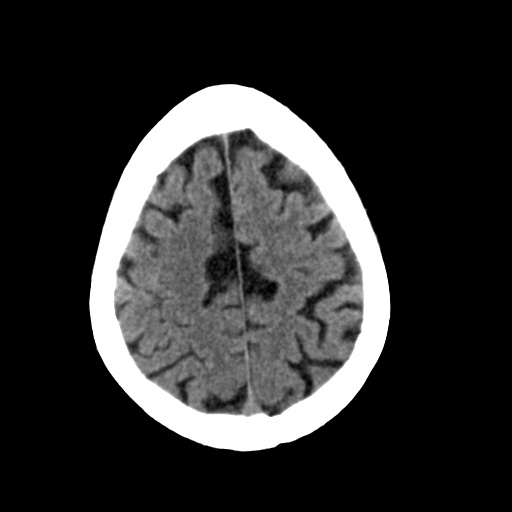
[im 26/30  brain]
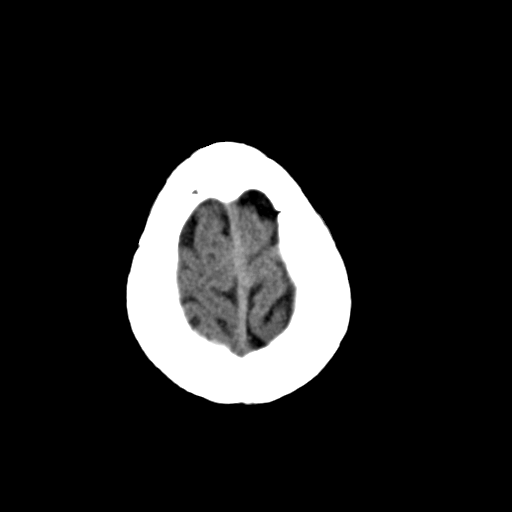

[Series 3: head bone · axial · 0.42mm/px · z∈[+224,+274]mm · 4 of 74 slices shown]
[im 8/74  bone]
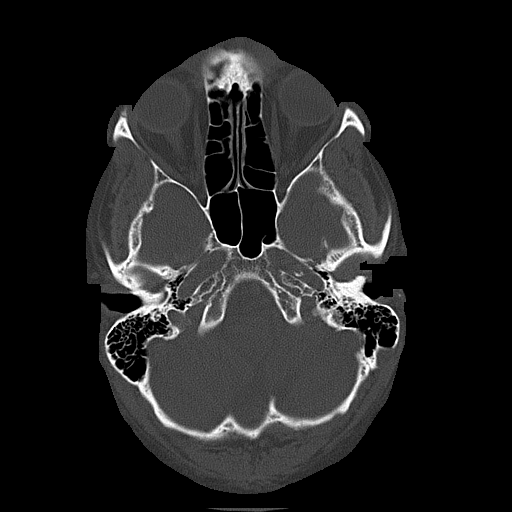
[im 15/74  bone]
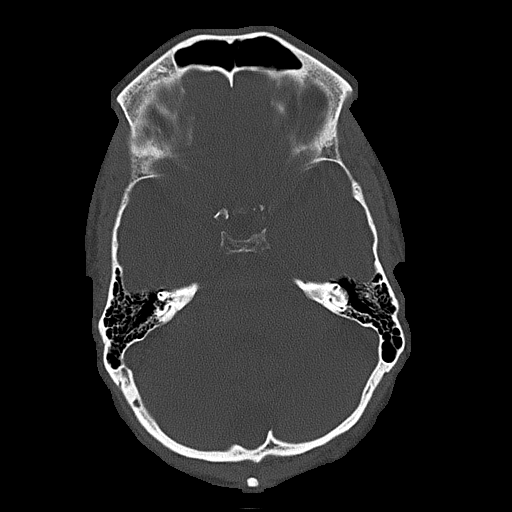
[im 22/74  bone]
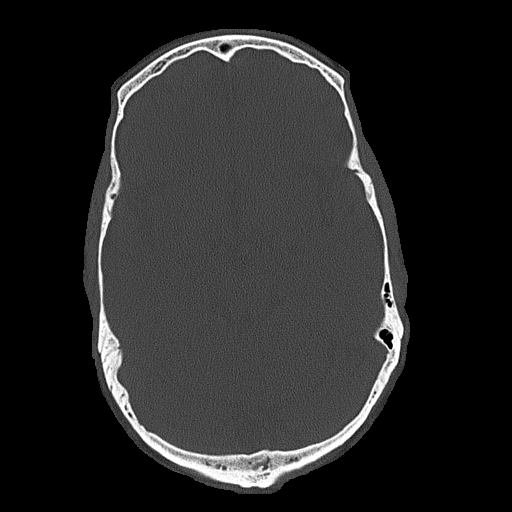
[im 33/74  bone]
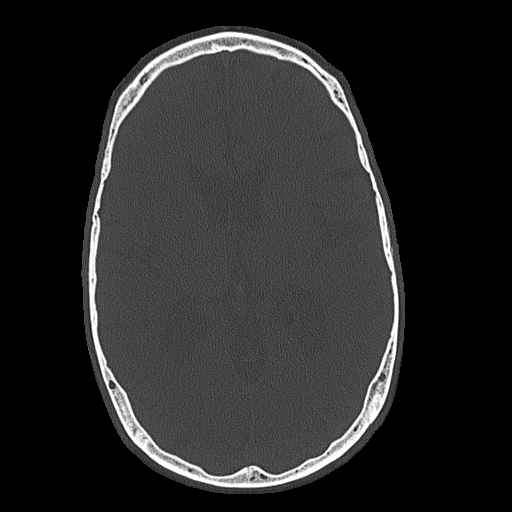

[Series 4: coronal soft tissue · coronal · 0.29mm/px · 3 of 68 slices shown]
[im 23/68  brain]
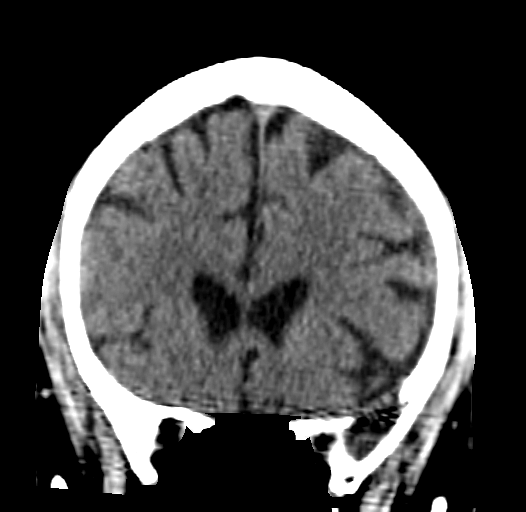
[im 30/68  brain]
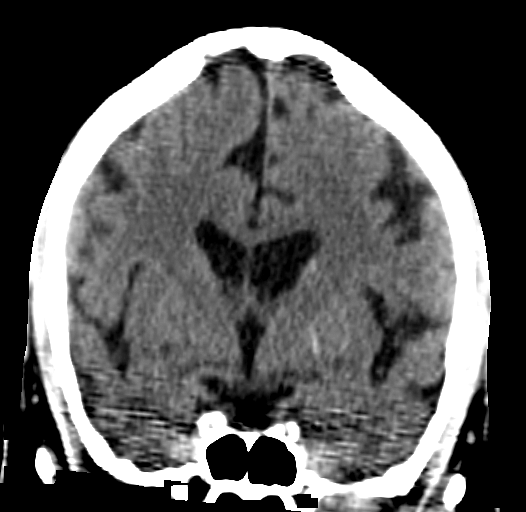
[im 38/68  brain]
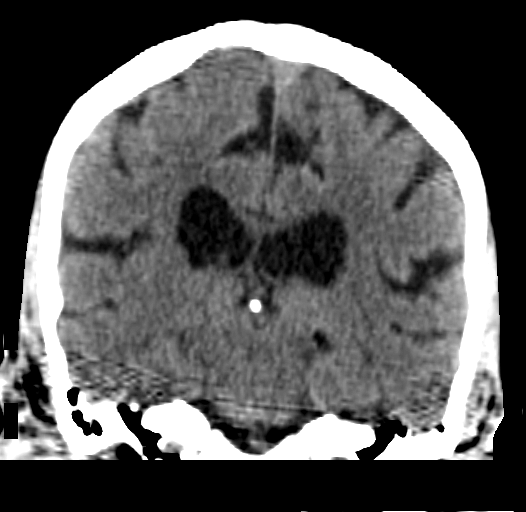

[Series 5: sagittal soft tissue · sagittal · 0.30mm/px · 3 of 52 slices shown]
[im 18/52  brain]
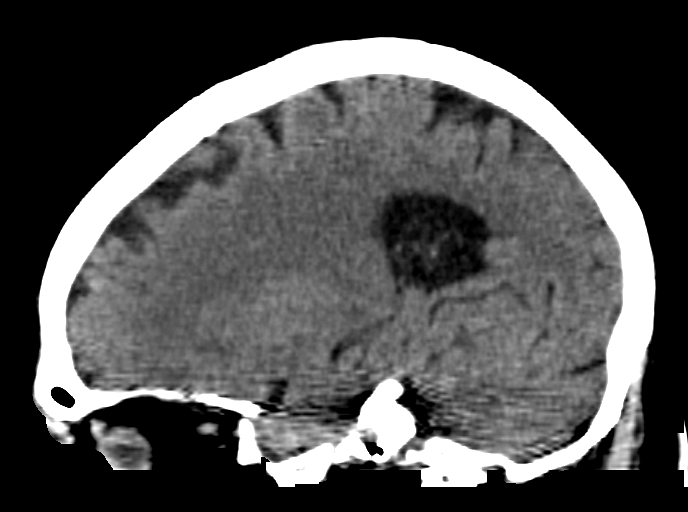
[im 26/52  brain]
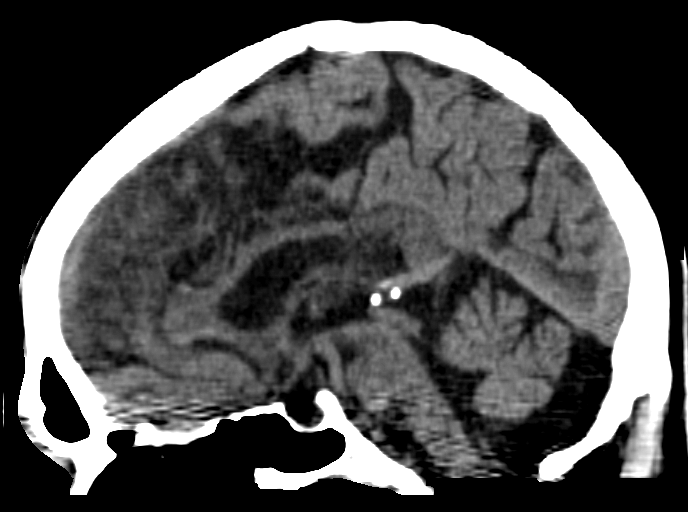
[im 35/52  brain]
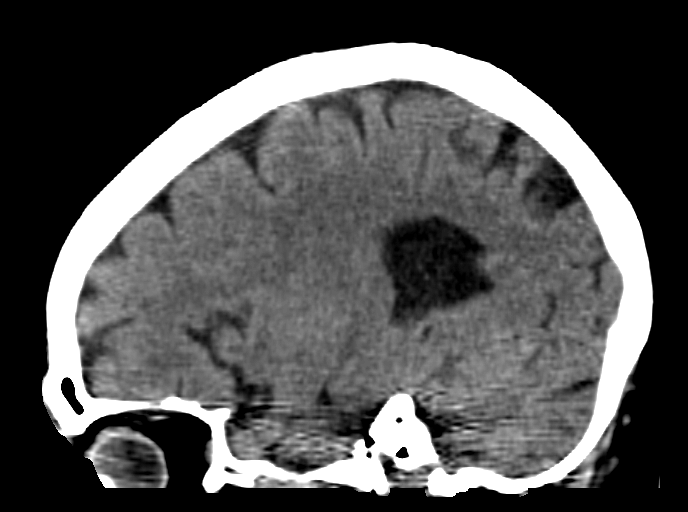

[17 of 47 positions shown; findings below may reference images not displayed]

FINDINGS: Forehead soft tissue swelling without underlying skull fracture. No
fluid in the sinuses. Incidental left frontal sinus osteoma. No
intracranial hemorrhage. Generalized brain atrophy as seen
previously. No evidence of old or acute focal infarction. No mass
lesion or hydrocephalus. There is atherosclerotic calcification of
the major vessels at the base of the brain.
IMPRESSION: Forehead swelling. No skull fracture or traumatic intracranial
finding. Brain atrophy as seen previously.

## 2017-09-17 ENCOUNTER — Other Ambulatory Visit: Payer: Self-pay

## 2017-09-17 ENCOUNTER — Ambulatory Visit (INDEPENDENT_AMBULATORY_CARE_PROVIDER_SITE_OTHER): Payer: 59 | Admitting: Psychiatry

## 2017-09-17 ENCOUNTER — Encounter: Payer: Self-pay | Admitting: Psychiatry

## 2017-09-17 VITALS — BP 139/84 | HR 75 | Temp 98.0°F | Wt 204.8 lb

## 2017-09-17 DIAGNOSIS — G894 Chronic pain syndrome: Secondary | ICD-10-CM | POA: Diagnosis not present

## 2017-09-17 DIAGNOSIS — R413 Other amnesia: Secondary | ICD-10-CM

## 2017-09-17 DIAGNOSIS — F331 Major depressive disorder, recurrent, moderate: Secondary | ICD-10-CM

## 2017-09-17 MED ORDER — BUPROPION HCL ER (XL) 300 MG PO TB24: 300.0000 mg | ORAL_TABLET | Freq: Every day | ORAL | 1 refills | Status: DC

## 2017-09-17 MED ORDER — TRAZODONE HCL 150 MG PO TABS: 150.0000 mg | ORAL_TABLET | Freq: Every day | ORAL | 1 refills | Status: DC

## 2017-09-17 MED ORDER — ESCITALOPRAM OXALATE 20 MG PO TABS: 20.0000 mg | ORAL_TABLET | Freq: Every day | ORAL | 1 refills | Status: DC

## 2017-09-17 NOTE — Progress Notes (Signed)
[place] MD OP Progress Note  09/17/2017 3:29 PM Todd Banks  MRN:  [id number]  Chief Complaint: ' I am tired all the time.' Chief Complaint    Follow-up; Medication Refill     HPI: Todd Banks is a 67 year old Caucasian male who presents to the clinic today for a follow-up visit.  Todd Banks is married, on SSI.  He has a history of depression and chronic pain.  Patient today reports that he struggles with low energy, fatigue, lack of motivation on a regular basis.  He also reports he has reduced appetite most of the days, eats only one meal.  He reports he stays in bed most of the day except on weekends.  He reports that on weekends his wife is home and he spends more time with her, goes out for dinner and so on.  He is on Lexapro and Wellbutrin.  He reports he is tolerating the medications well.  He is also on other medications for his multiple medical problems as well as chronic pain.  He is on oxycodone up to 3 times a day at this time.  He also reports memory problems, forgetfulness.  He reports he cannot remember what he did a few days ago.  He also reports he has gotten lost when he drive at night.  Discussed medication changes with patient.  Discussed that his Wellbutrin can be increased to address his fatigue and lack of motivation.  Also discussed with him that his low energy and fatigue may also be because of the multiple medications he is on including the pain medications.  Also discussed with patient that medications like oxycodone can also cause cognitive problems.  An MMSE that was done on patient today and he scored 30 out of 30.    Visit Diagnosis:    ICD-10-CM   1. MDD (major depressive disorder), recurrent episode, moderate (HCC) F33.1   2. Chronic pain syndrome G89.4   3. Memory problem R41.3     Past Psychiatric History: History of past inpatient admission to [facility].  Patient denied past suicide attempts.  Past trials of Zoloft.  Past Medical History:  Past Medical History:   Diagnosis Date  . Anemia   . Anxiety   . Chronic back pain   . Constipation   . Coronary artery disease   . DDD (degenerative disc disease), lumbar   . Depression   . Diabetes mellitus without complication ([place])   . Diabetes mellitus, type II ([place])   . Dizziness   . Heart disease   . Hypertension   . Intermittent self-catheterization of bladder   . Kidney stones   . PUD (peptic ulcer disease)   . Shingles     Past Surgical History:  Procedure Laterality Date  . BACK SURGERY    . CARDIAC CATHETERIZATION N/A 08/28/2015   Procedure: Left Heart Cath and Coronary Angiography;  Surgeon: Isaias Cowman, MD;  Location: [place] CV LAB;  Service: Cardiovascular;  Laterality: N/A;  . CHOLECYSTECTOMY    . COLONOSCOPY WITH PROPOFOL N/A 08/07/2017   Procedure: COLONOSCOPY WITH PROPOFOL;  Surgeon: Manya Silvas, MD;  Location: [facility] ENDOSCOPY;  Service: Endoscopy;  Laterality: N/A;  . CORONARY ANGIOPLASTY    . CORONARY STENT PLACEMENT    . ESOPHAGOGASTRODUODENOSCOPY (EGD) WITH PROPOFOL N/A 09/29/2016   Procedure: ESOPHAGOGASTRODUODENOSCOPY (EGD) WITH PROPOFOL;  Surgeon: Manya Silvas, MD;  Location: [facility] ENDOSCOPY;  Service: Endoscopy;  Laterality: N/A;  . ESOPHAGOGASTRODUODENOSCOPY (EGD) WITH PROPOFOL N/A 08/07/2017   Procedure: ESOPHAGOGASTRODUODENOSCOPY (EGD) WITH  PROPOFOL;  Surgeon: Manya Silvas, MD;  Location: [facility] ENDOSCOPY;  Service: Endoscopy;  Laterality: N/A;  . HAND SURGERY Right     Family Psychiatric History: Father-alcohol, son and daughter-depression.  Family History:  Family History  Problem Relation Age of Onset  . Diabetes Mother   . Heart disease Mother   . Hypertension Mother   . Depression Father   . Heart disease Father   . Hypertension Father   . Prostate cancer Neg Hx   . Bladder Cancer Neg Hx   . Kidney cancer Neg Hx    Substance abuse history: Denies  Social History: He is married.  He is on SSI.  He has 4 children.  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 Social History   Socioeconomic History  . Marital status: Married    Spouse name: lynette  . Number of children: 4  . Years of education: None  . Highest education level: Some college, no degree  Social Needs  . Financial resource strain: Not hard at all  . Food insecurity - worry: Never true  . Food insecurity - inability: Never true  . Transportation needs - medical: No  . Transportation needs - non-medical: No  Occupational History    Comment: disabled  Tobacco Use  . Smoking status: Former Smoker    Last attempt to quit: 07/22/1993    Years since quitting: 24.1  . Smokeless tobacco: Never Used  Substance and Sexual Activity  . Alcohol use: No    Alcohol/week: 0.0 oz  . Drug use: No  . Sexual activity: Not Currently  Other Topics Concern  . None  Social History Narrative  . None    Allergies:  Allergies  Allergen Reactions  . Cephalexin Hives    Other reaction(s): Unknown Other reaction(s): Unknown  . Cephalosporins Swelling    Other Reaction: reacts to Keflex  . Ampicillin Swelling    Other reaction(s): Unknown  . Cefazolin Swelling    Other reaction(s): Other (qualifier value)  . Cefuroxime Axetil Swelling  . Ciprofloxacin Swelling    Other reaction(s): Respiratory distress (finding), Weal  . Clindamycin Swelling  . Doxycycline Swelling    Other reaction(s): Unknown  . Erythromycin Swelling    Other reaction(s): Unknown  . Latex Other (See Comments)    Other reaction(s): BLISTERS  . Levofloxacin Swelling    Other reaction(s): Altered mental status (finding), Itching of Skin  . Other Swelling    All Mycins..  . Penicillins Swelling  . Pork (Porcine) Protein     Other reaction(s): Other (qualifier value)  . Prevacid [Lansoprazole]     Other reaction(s): Unknown Other reaction(s): Unknown    Metabolic Disorder Labs: No results found for: HGBA1C, MPG Lab Results  Component Value Date    PROLACTIN 19.0 (H) 03/26/2017   No results found for: CHOL, TRIG, HDL, CHOLHDL, VLDL, LDLCALC No results found for: TSH  Therapeutic Level Labs: No results found for: LITHIUM No results found for: VALPROATE No components found for:  CBMZ  Current Medications: Current Outpatient Medications  Medication Sig Dispense Refill  . aspirin 325 MG tablet Take 325 mg by mouth daily.    Marland Kitchen buPROPion (WELLBUTRIN XL) 300 MG 24 hr tablet Take 1 tablet (300 mg total) by mouth daily. 90 tablet 1  . clomiPHENE (CLOMID) 50 MG tablet 1/2 tab daily 30 tablet 0  . cyanocobalamin (,VITAMIN B-12,) 1000 MCG/ML injection     . docusate sodium (  COLACE) 50 MG capsule Take by mouth.    . escitalopram (LEXAPRO) 20 MG tablet Take 1 tablet (20 mg total) by mouth daily. 90 tablet 1  . gemfibrozil (LOPID) 600 MG tablet Take 600 mg by mouth 2 (two) times daily before a meal.    . glucose blood (ONE TOUCH ULTRA TEST) test strip Use as instructed 5 times daily.    . insulin glargine (LANTUS) 100 UNIT/ML injection Inject 70 Units into the skin at bedtime.     . insulin lispro (HUMALOG) 100 UNIT/ML injection 15 Units 3 (three) times daily before meals. Sliding scale    . Insulin Pen Needle (EXEL COMFORT POINT PEN NEEDLE) 29G X 12MM MISC Use one four times daily.    . isosorbide dinitrate (ISORDIL) 30 MG tablet Take 30 mg by mouth 2 (two) times daily.    . isosorbide mononitrate (IMDUR) 30 MG 24 hr tablet     . metFORMIN (GLUCOPHAGE) 1000 MG tablet Take 1,000 mg by mouth 2 (two) times daily with a meal.    . metoprolol succinate (TOPROL-XL) 25 MG 24 hr tablet Take 25 mg by mouth daily.    Marland Kitchen NEEDLE, DISP, 25 G (BD SAFETYGLIDE SHIELDED NEEDLE) 25G X 1" MISC Use 1 each monthly. for B12 injections    . nitroGLYCERIN (NITROSTAT) 0.4 MG SL tablet Place under the tongue.    Marland Kitchen omeprazole (PRILOSEC) 20 MG capsule Take 20 mg by mouth 2 (two) times daily before a meal.    . orphenadrine (NORFLEX) 100 MG tablet Limit  1 tab po / day or  bid  If tolerated 60 tablet 2  . oxyCODONE (OXY IR/ROXICODONE) 5 MG immediate release tablet LIMIT 1 TAB BY MOUTH 2 TO 4 TIMES PER DAY FOR BREAKTHROUGH PAIN WHILE TAKING METHADONE IF TOLERATED  0  . prasugrel (EFFIENT) 10 MG TABS tablet Take 10 mg by mouth daily.    . pravastatin (PRAVACHOL) 40 MG tablet Take 40 mg by mouth daily.    Marland Kitchen RANEXA 500 MG 12 hr tablet Take 500 mg by mouth 2 (two) times daily.  0  . SYRINGE-NEEDLE, DISP, 3 ML (MEDSAVER SYRINGE 3CC/23GX1") 23G X 1" 3 ML MISC Use as directed with  cyanocobalamin    . traZODone (DESYREL) 150 MG tablet Take 1 tablet (150 mg total) by mouth at bedtime. 90 tablet 1  . pioglitazone (ACTOS) 15 MG tablet Take by mouth.     No current facility-administered medications for this visit.      Musculoskeletal: Strength & Muscle Tone: within normal limits Gait & Station: normal Patient leans: N/A  Psychiatric Specialty Exam: Review of Systems  Psychiatric/Behavioral: Positive for depression.  All other systems reviewed and are negative.   Blood pressure 139/84, pulse 75, temperature 98 F (36.7 C), temperature source Oral, weight 204 lb 12.8 oz (92.9 kg).Body mass index is 31.14 kg/m.  General Appearance: Casual  Eye Contact:  Fair  Speech:  Clear and Coherent  Volume:  Normal  Mood:  Dysphoric  Affect:  Appropriate  Thought Process:  Goal Directed and Descriptions of Associations: Intact  Orientation:  Full (Time, Place, and Person)  Thought Content: Logical   Suicidal Thoughts:  No  Homicidal Thoughts:  No  Memory:  Immediate;   Fair Recent;   Fair Remote;   Fair  Judgement:  Fair  Insight:  Fair  Psychomotor Activity:  Normal  Concentration:  Concentration: Fair and Attention Span: Fair  Recall:  [employer] of Knowledge: Fair  Language:  Fair  Akathisia:  No  Handed:  Right  AIMS (if indicated): NA  Assets:  Communication Skills Desire for Improvement Housing Social Support  ADL's:  Intact  Cognition: WNL  Sleep:   Fair   Screenings: PHQ2-9     Office Visit from 03/04/2016 in [place] Procedure visit from 01/21/2016 in [place] Clinical Support from 12/31/2015 in [place] Office Visit from 11/29/2015 in [place] Clinical Support from 10/04/2015 in [place]  PHQ-2 Total Score  0  0  0  0  0       Assessment and Plan: Todd Banks is a 67 year old Caucasian male who has a history of depression as well as chronic pain, presented to the clinic here for a follow-up visit.  Patient today reports continued depressive symptoms, fatigue, tiredness, lack of motivation.  Patient also reports memory problems.  Patient has multiple medical problems , has chronic pain as well as is on polypharmacy.  Discussed plan as noted below.  Plan For depression Continue Wellbutrin XL 300 mg p.o. daily.  Patient reports he does not want any dose increase today. Continue Lexapro 20 mg p.o. daily Patient to be referred to Ms. Peacock for psychotherapy due to his continued depressive symptoms.  He agrees with plan.  For insomnia Trazodone 150 mg p.o. daily  For memory problems Patient reported forgetfulness.  However an MMSE done today = 30 out of 30. Discussed other causes of memory problems including his depression, polypharmacy, being on narcotic medications and so on. Also discussed vitamin B12 deficiency can cause the same.  Patient reports he is currently on vitamin B12 replacement.  He will reach out to his primary medical doctor to get the labs repeated. Patient denies any thyroid problems and reports he will reach out to his primary medical doctor to get thyroid panel.  Provided supportive psychotherapy for 10 minutes.  Refer to Ms. Peacock for CBT.  Follow-up in clinic in 2-3 months or sooner if  needed.  More than 50 % of the time was spent for psychoeducation and supportive psychotherapy and care coordination.  This note was generated in part or whole with voice recognition software. Voice recognition is usually quite accurate but there are transcription errors that can and very often do occur. I apologize for any typographical errors that were not detected and corrected.       Ursula Alert, MD 09/17/2017, 3:29 PM

## 2017-10-14 ENCOUNTER — Ambulatory Visit (INDEPENDENT_AMBULATORY_CARE_PROVIDER_SITE_OTHER): Payer: 59 | Admitting: Licensed Clinical Social Worker

## 2017-10-14 DIAGNOSIS — F331 Major depressive disorder, recurrent, moderate: Secondary | ICD-10-CM | POA: Diagnosis not present

## 2017-10-14 DIAGNOSIS — G894 Chronic pain syndrome: Secondary | ICD-10-CM | POA: Diagnosis not present
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
## 2017-10-27 ENCOUNTER — Encounter: Payer: Self-pay | Admitting: Psychiatry

## 2017-10-27 ENCOUNTER — Other Ambulatory Visit: Payer: Self-pay

## 2017-10-27 ENCOUNTER — Ambulatory Visit (INDEPENDENT_AMBULATORY_CARE_PROVIDER_SITE_OTHER): Payer: 59 | Admitting: Psychiatry

## 2017-10-27 VITALS — BP 150/73 | HR 80 | Temp 97.5°F | Wt 199.2 lb

## 2017-10-27 DIAGNOSIS — R413 Other amnesia: Secondary | ICD-10-CM

## 2017-10-27 DIAGNOSIS — F331 Major depressive disorder, recurrent, moderate: Secondary | ICD-10-CM | POA: Diagnosis not present

## 2017-10-27 DIAGNOSIS — F419 Anxiety disorder, unspecified: Secondary | ICD-10-CM | POA: Diagnosis not present

## 2017-10-27 DIAGNOSIS — G894 Chronic pain syndrome: Secondary | ICD-10-CM

## 2017-10-27 MED ORDER — BUPROPION HCL ER (XL) 150 MG PO TB24: 150.0000 mg | ORAL_TABLET | Freq: Every day | ORAL | 1 refills | Status: DC

## 2017-10-27 MED ORDER — HYDROXYZINE HCL 10 MG PO TABS: 10.0000 mg | ORAL_TABLET | Freq: Two times a day (BID) | ORAL | 1 refills | Status: DC | PRN

## 2017-10-27 MED ORDER — BUPROPION HCL ER (XL) 300 MG PO TB24: 300.0000 mg | ORAL_TABLET | Freq: Every day | ORAL | 1 refills | Status: DC

## 2017-10-27 NOTE — Patient Instructions (Signed)
Hydroxyzine capsules or tablets What is this medicine? HYDROXYZINE (hye [place] i zeen) is an antihistamine. This medicine is used to treat allergy symptoms. It is also used to treat anxiety and tension. This medicine can be used with other medicines to induce sleep before surgery. This medicine may be used for other purposes; ask your health care provider or pharmacist if you have questions. COMMON BRAND NAME(S): ANX, Atarax, Rezine, Vistaril What should I tell my health care provider before I take this medicine? They need to know if you have any of these conditions: -any chronic illness -difficulty passing urine -glaucoma -heart disease -kidney disease -liver disease -lung disease -an unusual or allergic reaction to hydroxyzine, cetirizine, other medicines, foods, dyes, or preservatives -pregnant or trying to get pregnant -breast-feeding How should I use this medicine? Take this medicine by mouth with a full glass of water. Follow the directions on the prescription label. You may take this medicine with food or on an empty stomach. Take your medicine at regular intervals. Do not take your medicine more often than directed. Talk to your pediatrician regarding the use of this medicine in children. Special care may be needed. While this drug may be prescribed for children as young as 75 years of age for selected conditions, precautions do apply. Patients over 62 years old may have a stronger reaction and need a smaller dose. Overdosage: If you think you have taken too much of this medicine contact a poison control center or emergency room at once. NOTE: This medicine is only for you. Do not share this medicine with others. What if I miss a dose? If you miss a dose, take it as soon as you can. If it is almost time for your next dose, take only that dose. Do not take double or extra doses. What may interact with this medicine? -alcohol -barbiturate medicines for sleep or seizures -medicines for  colds, allergies -medicines for depression, anxiety, or emotional disturbances -medicines for pain -medicines for sleep -muscle relaxants This list may not describe all possible interactions. Give your health care provider a list of all the medicines, herbs, non-prescription drugs, or dietary supplements you use. Also tell them if you smoke, drink alcohol, or use illegal drugs. Some items may interact with your medicine. What should I watch for while using this medicine? Tell your doctor or health care professional if your symptoms do not improve. You may get drowsy or dizzy. Do not drive, use machinery, or do anything that needs mental alertness until you know how this medicine affects you. Do not stand or sit up quickly, especially if you are an older patient. This reduces the risk of dizzy or fainting spells. Alcohol may interfere with the effect of this medicine. Avoid alcoholic drinks. Your mouth may get dry. Chewing sugarless gum or sucking hard candy, and drinking plenty of water may help. Contact your doctor if the problem does not go away or is severe. This medicine may cause dry eyes and blurred vision. If you wear contact lenses you may feel some discomfort. Lubricating drops may help. See your eye doctor if the problem does not go away or is severe. If you are receiving skin tests for allergies, tell your doctor you are using this medicine. What side effects may I notice from receiving this medicine? Side effects that you should report to your doctor or health care professional as soon as possible: -fast or irregular heartbeat -difficulty passing urine -seizures -slurred speech or confusion -tremor Side effects that  usually do not require medical attention (report to your doctor or health care professional if they continue or are bothersome): -constipation -drowsiness -fatigue -headache -stomach upset This list may not describe all possible side effects. Call your doctor for  medical advice about side effects. You may report side effects to FDA at 1-800-FDA-1088. Where should I keep my medicine? Keep out of the reach of children. Store at room temperature between 15 and 30 degrees C (59 and 86 degrees F). Keep container tightly closed. Throw away any unused medicine after the expiration date. NOTE: This sheet is a summary. It may not cover all possible information. If you have questions about this medicine, talk to your doctor, pharmacist, or health care provider.  2018 Elsevier/Gold Standard (2007-11-12 14:50:59)  

## 2017-10-27 NOTE — Progress Notes (Signed)
[facility] OP Progress Note  10/27/2017 7:32 PM Todd Banks  MRN:  [id number]  Chief Complaint: ' I am anxious." Chief Complaint    Follow-up; Medication Refill     HPI: Todd Banks is a 67 year old Caucasian male, married, on SSI, lives in [place], has a history of depression, chronic pain.  Is here for a follow-up visit today.  Patient today reports he has been struggling with lack of motivation, anhedonia, fatigue and low energy.  Patient reports he would like his medication to be increased to address the same.  Patient did not want his Wellbutrin readjusted last visit.  However he agrees to the same today.  Patient also reports anxiety symptoms.  Patient reports feeling restless, on edge and nervous.  Patient reports this has been going on since the past few weeks.  He reports his pain provider is weaning him off of oxycodone.  He was taking it 3 times a day and now takes it only once a day.  Patient reports he was prescribed gabapentin to address his pain.  Patient would like him to be started on Xanax.  Looking back into his record as well as [place] controlled substance database patient was not prescribed Xanax since 2017 or so.  Discussed with patient that I would not start him back on Xanax because of his memory issues as well as his age.  Discussed with him alternate medications to address this anxiety symptoms.  Also discussed continued psychotherapy with Ms. Peacock.  Also advised patient that one of the reasons he could be having worsening anxiety sx could be because of being weaned off of his pain medications.  Patient reports sleep is good.  He reports he takes trazodone as prescribed.  Patient reports his wife continues to be supportive.  Patient denies any suicidality.  Patient denies any perceptual disturbances.  Patient today reports no changes in his memory issues.  He does not think it is worsening. Visit Diagnosis:    ICD-10-CM   1. MDD (major depressive disorder), recurrent  episode, moderate (HCC) F33.1   2. Memory problem R41.3   3. Chronic pain syndrome G89.4   4. Anxiety disorder, unspecified type F41.9     Past Psychiatric History: History of past inpatient admission to [facility].  Patient denied past suicide attempts.  Past trials of Zoloft.  Past Medical History:  Past Medical History:  Diagnosis Date  . Anemia   . Anxiety   . Chronic back pain   . Constipation   . Coronary artery disease   . DDD (degenerative disc disease), lumbar   . Depression   . Diabetes mellitus without complication ([place])   . Diabetes mellitus, type II ([place])   . Dizziness   . Heart disease   . Hypertension   . Intermittent self-catheterization of bladder   . Kidney stones   . PUD (peptic ulcer disease)   . Shingles     Past Surgical History:  Procedure Laterality Date  . BACK SURGERY    . CARDIAC CATHETERIZATION N/A 08/28/2015   Procedure: Left Heart Cath and Coronary Angiography;  Surgeon: Isaias Cowman, MD;  Location: [place] [facility];  Service: Cardiovascular;  Laterality: N/A;  . CHOLECYSTECTOMY    . COLONOSCOPY WITH PROPOFOL N/A 08/07/2017   Procedure: COLONOSCOPY WITH PROPOFOL;  Surgeon: Manya Silvas, MD;  Location: [facility] ENDOSCOPY;  Service: Endoscopy;  Laterality: N/A;  . CORONARY ANGIOPLASTY    . CORONARY STENT PLACEMENT    . ESOPHAGOGASTRODUODENOSCOPY (EGD) WITH PROPOFOL N/A  09/29/2016   Procedure: ESOPHAGOGASTRODUODENOSCOPY (EGD) WITH PROPOFOL;  Surgeon: Manya Silvas, MD;  Location: [facility] ENDOSCOPY;  Service: Endoscopy;  Laterality: N/A;  . ESOPHAGOGASTRODUODENOSCOPY (EGD) WITH PROPOFOL N/A 08/07/2017   Procedure: ESOPHAGOGASTRODUODENOSCOPY (EGD) WITH PROPOFOL;  Surgeon: Manya Silvas, MD;  Location: [facility] ENDOSCOPY;  Service: Endoscopy;  Laterality: N/A;  . HAND SURGERY Right     Family Psychiatric History: Father-alcohol, son and daughter-depression.  Family History:  Family History  Problem Relation Age of Onset  . Diabetes Mother    . Heart disease Mother   . Hypertension Mother   . Depression Father   . Heart disease Father   . Hypertension Father   . Prostate cancer Neg Hx   . Bladder Cancer Neg Hx   . Kidney cancer Neg Hx   Substance abuse history: Denies   Social History: He is married.  He is on SSI.  He has 4 children.  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 Social History   Socioeconomic History  . Marital status: Married    Spouse name: lynette  . Number of children: 4  . Years of education: Not on file  . Highest education level: Some college, no degree  Occupational History    Comment: disabled  Social Needs  . Financial resource strain: Not hard at all  . Food insecurity:    Worry: Never true    Inability: Never true  . Transportation needs:    Medical: No    Non-medical: No  Tobacco Use  . Smoking status: Former Smoker    Last attempt to quit: 07/22/1993    Years since quitting: 24.2  . Smokeless tobacco: Never Used  Substance and Sexual Activity  . Alcohol use: No    Alcohol/week: 0.0 oz  . Drug use: No  . Sexual activity: Not Currently  Lifestyle  . Physical activity:    Days per week: 0 days    Minutes per session: 0 min  . Stress: Not on file  Relationships  . Social connections:    Talks on phone: Never    Gets together: More than three times a week    Attends religious service: More than 4 times per year    Active member of club or organization: No    Attends meetings of clubs or organizations: Never    Relationship status: Married  Other Topics Concern  . Not on file  Social History Narrative  . Not on file    Allergies:  Allergies  Allergen Reactions  . Cephalexin Hives    Other reaction(s): Unknown Other reaction(s): Unknown  . Cephalosporins Swelling    Other Reaction: reacts to Keflex  . Ampicillin Swelling    Other reaction(s): Unknown  . Cefazolin Swelling    Other reaction(s): Other (qualifier value)  .  Cefuroxime Axetil Swelling  . Ciprofloxacin Swelling    Other reaction(s): Respiratory distress (finding), Weal  . Clindamycin Swelling  . Doxycycline Swelling    Other reaction(s): Unknown  . Erythromycin Swelling    Other reaction(s): Unknown  . Latex Other (See Comments)    Other reaction(s): BLISTERS  . Levofloxacin Swelling    Other reaction(s): Altered mental status (finding), Itching of Skin  . Other Swelling    All Mycins..  . Penicillins Swelling  . Pork (Porcine) Protein     Other reaction(s): Other (qualifier value)  . Prevacid [Lansoprazole]     Other reaction(s): Unknown Other reaction(s): Unknown  Metabolic Disorder Labs: No results found for: HGBA1C, MPG Lab Results  Component Value Date   PROLACTIN 19.0 (H) 03/26/2017   No results found for: CHOL, TRIG, HDL, CHOLHDL, VLDL, LDLCALC No results found for: TSH  Therapeutic Level Labs: No results found for: LITHIUM No results found for: VALPROATE No components found for:  CBMZ  Current Medications: Current Outpatient Medications  Medication Sig Dispense Refill  . aspirin 325 MG tablet Take 325 mg by mouth daily.    Marland Kitchen buPROPion (WELLBUTRIN XL) 300 MG 24 hr tablet Take 1 tablet (300 mg total) by mouth daily. To be combined with 150 mg 90 tablet 1  . clomiPHENE (CLOMID) 50 MG tablet 1/2 tab daily 30 tablet 0  . cyanocobalamin (,VITAMIN B-12,) 1000 MCG/ML injection     . docusate sodium (COLACE) 50 MG capsule Take by mouth.    . escitalopram (LEXAPRO) 20 MG tablet Take 1 tablet (20 mg total) by mouth daily. 90 tablet 1  . gabapentin (NEURONTIN) 300 MG capsule Take by mouth.    Marland Kitchen gemfibrozil (LOPID) 600 MG tablet Take 600 mg by mouth 2 (two) times daily before a meal.    . glucose blood (ONE TOUCH ULTRA TEST) test strip Use as instructed 5 times daily.    . insulin glargine (LANTUS) 100 UNIT/ML injection Inject 70 Units into the skin at bedtime.     . insulin lispro (HUMALOG) 100 UNIT/ML injection 15 Units 3  (three) times daily before meals. Sliding scale    . Insulin Pen Needle (EXEL COMFORT POINT PEN NEEDLE) 29G X 12MM MISC Use one four times daily.    . isosorbide dinitrate (ISORDIL) 30 MG tablet Take 30 mg by mouth 2 (two) times daily.    . isosorbide mononitrate (IMDUR) 30 MG 24 hr tablet     . metFORMIN (GLUCOPHAGE) 1000 MG tablet Take 1,000 mg by mouth 2 (two) times daily with a meal.    . metoprolol succinate (TOPROL-XL) 25 MG 24 hr tablet Take 25 mg by mouth daily.    Marland Kitchen NEEDLE, DISP, 25 G (BD SAFETYGLIDE SHIELDED NEEDLE) 25G X 1" MISC Use 1 each monthly. for B12 injections    . nitroGLYCERIN (NITROSTAT) 0.4 MG SL tablet Place under the tongue.    Marland Kitchen omeprazole (PRILOSEC) 20 MG capsule Take 20 mg by mouth 2 (two) times daily before a meal.    . orphenadrine (NORFLEX) 100 MG tablet Limit  1 tab po / day or bid  If tolerated 60 tablet 2  . prasugrel (EFFIENT) 10 MG TABS tablet Take 10 mg by mouth daily.    . pravastatin (PRAVACHOL) 40 MG tablet Take 40 mg by mouth daily.    Marland Kitchen RANEXA 500 MG 12 hr tablet Take 500 mg by mouth 2 (two) times daily.  0  . SYRINGE-NEEDLE, DISP, 3 ML (MEDSAVER SYRINGE 3CC/23GX1") 23G X 1" 3 ML MISC Use as directed with  cyanocobalamin    . traZODone (DESYREL) 150 MG tablet Take 1 tablet (150 mg total) by mouth at bedtime. 90 tablet 1  . buPROPion (WELLBUTRIN XL) 150 MG 24 hr tablet Take 1 tablet (150 mg total) by mouth daily. To be taken along with 300 mg 90 tablet 1  . hydrOXYzine (ATARAX/VISTARIL) 10 MG tablet Take 1-2 tablets (10-20 mg total) by mouth 2 (two) times daily as needed for anxiety. 120 tablet 1  . pioglitazone (ACTOS) 15 MG tablet Take by mouth.     No current facility-administered medications for this visit.  Musculoskeletal: Strength & Muscle Tone: within normal limits Gait & Station: normal Patient leans: N/A  Psychiatric Specialty Exam: Review of Systems  Psychiatric/Behavioral: Positive for depression. The patient is nervous/anxious.    All other systems reviewed and are negative.   Blood pressure (!) 150/73, pulse 80, temperature (!) 97.5 F (36.4 C), temperature source Oral, weight 199 lb 3.2 oz (90.4 kg).Body mass index is 30.29 kg/m.  General Appearance: Casual  Eye Contact:  Fair  Speech:  Normal Rate  Volume:  Normal  Mood:  Anxious and Dysphoric  Affect:  Congruent  Thought Process:  Goal Directed and Descriptions of Associations: Intact  Orientation:  Full (Time, Place, and Person)  Thought Content: Logical   Suicidal Thoughts:  No  Homicidal Thoughts:  No  Memory:  Immediate;   Fair Recent;   Fair Remote;   limited  Judgement:  Fair  Insight:  Fair  Psychomotor Activity:  Normal  Concentration:  Concentration: Fair and Attention Span: Fair  Recall:  [employer] of Knowledge: Fair  Language: Fair  Akathisia:  No  Handed:  Right  AIMS (if indicated): NA  Assets:  Desire for Improvement Housing Intimacy Social Support  ADL's:  Intact  Cognition: WNL  Sleep:  Fair   Screenings: PHQ2-9     Office Visit from 03/04/2016 in [facility] Procedure visit from 01/21/2016 in [place] Clinical Support from 12/31/2015 in [place] Office Visit from 11/29/2015 in [place] Clinical Support from 10/04/2015 in [place]  PHQ-2 Total Score  0  0  0  0  0       Assessment and Plan: Todd Banks is a 67 year old Caucasian male who has a history of depression, chronic pain, presented to the clinic for follow-up visit.  Patient continues to struggle with fatigue, tiredness, lack of motivation.  Patient also reports some recent anxiety symptoms.  Patient is currently being weaned off of his pain medications.  Patient reports he used to take Xanax as needed in the past .However based on controlled  substance database patient was not prescribed Xanax since 2017 or more.  Discussed with patient that he will not be restarted on the Xanax and discussed alternative therapy for anxiety symptoms.  Patient also has a hx of cognitive issues, memory problems and discussed with him about the risk of being on medications like benzodiazepines, opioid pain medications as well as polypharmacy which could make it worse.  Discussed plan as noted below.  Plan For depression Increase Wellbutrin XL to 450 mg p.o. daily. Continue Lexapro 20 mg p.o. daily Patient will continue psychotherapy with Ms. Peacock.    For anxiety symptoms Start hydroxyzine 10-20 mg p.o. twice daily as needed Discussed with pt that if hydroxyzine does not help we could add or increase his gabapentin or change his Lexapro to another SSRI/SNRI.  Patient agrees with plan.  For insomnia Trazodone 150 mg p.o. Nightly.  For memory problems Patient denies worsening symptoms at this time. MMSE was done on 09/17/2017-30 out of 30. Patient reports his vitamin B12 is currently being replaced by his PMD. Pending TSH.  Provided medication education, provided handouts.  Follow up in clinic in 4 weeks or sooner if needed.  More than 50 % of the time was spent for psychoeducation and supportive psychotherapy and care coordination. This note was generated in part or  whole with voice recognition software. Voice recognition is usually quite accurate but there are transcription errors that can and very often do occur. I apologize for any typographical errors that were not detected and corrected.         Ursula Alert, MD 10/27/2017, 7:32 PM

## 2017-11-11 ENCOUNTER — Ambulatory Visit (INDEPENDENT_AMBULATORY_CARE_PROVIDER_SITE_OTHER): Payer: 59 | Admitting: Licensed Clinical Social Worker

## 2017-11-11 DIAGNOSIS — F331 Major depressive disorder, recurrent, moderate: Secondary | ICD-10-CM

## 2017-11-24 ENCOUNTER — Other Ambulatory Visit: Payer: Self-pay

## 2017-11-24 ENCOUNTER — Encounter: Payer: Self-pay | Admitting: Psychiatry

## 2017-11-24 ENCOUNTER — Ambulatory Visit (INDEPENDENT_AMBULATORY_CARE_PROVIDER_SITE_OTHER): Payer: 59 | Admitting: Psychiatry

## 2017-11-24 VITALS — BP 126/75 | HR 73 | Temp 98.3°F | Wt 194.0 lb

## 2017-11-24 DIAGNOSIS — F419 Anxiety disorder, unspecified: Secondary | ICD-10-CM | POA: Diagnosis not present

## 2017-11-24 DIAGNOSIS — F331 Major depressive disorder, recurrent, moderate: Secondary | ICD-10-CM | POA: Diagnosis not present

## 2017-11-24 NOTE — Progress Notes (Signed)
[place] MD  OP Progress Note  11/24/2017 5:33 PM VANCE HOCHMUTH  MRN:  [id number]  Chief Complaint: ' I am here for follow up." Chief Complaint    Follow-up; Medication Refill     HPI: Todd Banks is a 67 year old Caucasian male, married, on SSI, lives in [place], has a history of depression, chronic pain, presented to the clinic today for a follow-up visit.  Patient reports he continues to struggle with feeling tired, fatigue and low energy during the day.  Patient is on a higher dose of Wellbutrin.  Patient reports it may be helping to some extent.  Patient also has started psychotherapy with Ms. Royal Piedra.  Discussed with patient that he is on polypharmacy including pain medications which can cause tiredness during the day.  Discussed with patient to give wellbutrin more time.  Patient reports sleep is good.  Discussed with patient to fill out Epworth sleep scale.  Patient scored 9 on the same.  Discussed with patient to monitor his symptoms closely and if he continues to struggle with excessive sleep during the day he can be referred for sleep study.  He agrees with plan.  Patient denies any suicidality.  Patient continues to have good social support system from his wife. Visit Diagnosis:    ICD-10-CM   1. MDD (major depressive disorder), recurrent episode, moderate (HCC) F33.1   2. Anxiety disorder, unspecified type F41.9     Past Psychiatric History: Reviewed past psychiatric history from my progress note on 10/27/2017.  Past Medical History:  Past Medical History:  Diagnosis Date  . Anemia   . Anxiety   . Chronic back pain   . Constipation   . Coronary artery disease   . DDD (degenerative disc disease), lumbar   . Depression   . Diabetes mellitus without complication ([place])   . Diabetes mellitus, type II ([place])   . Dizziness   . Heart disease   . Hypertension   . Intermittent self-catheterization of bladder   . Kidney stones   . PUD (peptic ulcer disease)   . Shingles      Past Surgical History:  Procedure Laterality Date  . BACK SURGERY    . CARDIAC CATHETERIZATION N/A 08/28/2015   Procedure: Left Heart Cath and Coronary Angiography;  Surgeon: Isaias Cowman, MD;  Location: [facility];  Service: Cardiovascular;  Laterality: N/A;  . CHOLECYSTECTOMY    . COLONOSCOPY WITH PROPOFOL N/A 08/07/2017   Procedure: COLONOSCOPY WITH PROPOFOL;  Surgeon: Manya Silvas, MD;  Location[facility] ENDOSCOPY;  Service: Endoscopy;  Laterality: N/A;  . CORONARY ANGIOPLASTY    . CORONARY STENT PLACEMENT    . ESOPHAGOGASTRODUODENOSCOPY (EGD) WITH PROPOFOL N/A 09/29/2016   Procedure: ESOPHAGOGASTRODUODENOSCOPY (EGD) WITH PROPOFOL;  Surgeon: Manya Silvas, MD;  Location: [facility] ENDOSCOPY;  Service: Endoscopy;  Laterality: N/A;  . ESOPHAGOGASTRODUODENOSCOPY (EGD) WITH PROPOFOL N/A 08/07/2017   Procedure: ESOPHAGOGASTRODUODENOSCOPY (EGD) WITH PROPOFOL;  Surgeon: Manya Silvas, MD;  Location: [facility] ENDOSCOPY;  Service: Endoscopy;  Laterality: N/A;  . HAND SURGERY Right     Family Psychiatric History: Reviiewed family psychiatric history from my progress note on 10/27/2017.  Family History:  Family History  Problem Relation Age of Onset  . Diabetes Mother   . Heart disease Mother   . Hypertension Mother   . Depression Father   . Heart disease Father   . Hypertension Father   . Prostate cancer Neg Hx   . Bladder Cancer Neg Hx   . Kidney cancer Neg  Hx     Social History: Reviewed social history from my progress note on 10/27/2017 Social History   Socioeconomic History  . Marital status: Married    Spouse name: lynette  . Number of children: 4  . Years of education: Not on file  . Highest education level: Some college, no degree  Occupational History    Comment: disabled  Social Needs  . Financial resource strain: Not hard at all  . Food insecurity:    Worry: Never true    Inability: Never true  . Transportation needs:    Medical: No     Non-medical: No  Tobacco Use  . Smoking status: Former Smoker    Last attempt to quit: 07/22/1993    Years since quitting: 24.3  . Smokeless tobacco: Never Used  Substance and Sexual Activity  . Alcohol use: No    Alcohol/week: 0.0 oz  . Drug use: No  . Sexual activity: Not Currently  Lifestyle  . Physical activity:    Days per week: 0 days    Minutes per session: 0 min  . Stress: Not on file  Relationships  . Social connections:    Talks on phone: Never    Gets together: More than three times a week    Attends religious service: More than 4 times per year    Active member of club or organization: No    Attends meetings of clubs or organizations: Never    Relationship status: Married  Other Topics Concern  . Not on file  Social History Narrative  . Not on file    Allergies:  Allergies  Allergen Reactions  . Cephalexin Hives    Other reaction(s): Unknown Other reaction(s): Unknown  . Cephalosporins Swelling    Other Reaction: reacts to Keflex  . Ampicillin Swelling    Other reaction(s): Unknown  . Cefazolin Swelling    Other reaction(s): Other (qualifier value)  . Cefuroxime Axetil Swelling  . Ciprofloxacin Swelling    Other reaction(s): Respiratory distress (finding), [place]  . Clindamycin Swelling  . Doxycycline Swelling    Other reaction(s): Unknown  . Erythromycin Swelling    Other reaction(s): Unknown  . Latex Other (See Comments)    Other reaction(s): BLISTERS  . Levofloxacin Swelling    Other reaction(s): Altered mental status (finding), Itching of Skin  . Other Swelling    All Mycins..  . Penicillins Swelling  . Pork (Porcine) Protein     Other reaction(s): Other (qualifier value)  . Prevacid [Lansoprazole]     Other reaction(s): Unknown Other reaction(s): Unknown    Metabolic Disorder Labs: No results found for: HGBA1C, MPG Lab Results  Component Value Date   PROLACTIN 19.0 (H) 03/26/2017   No results found for: CHOL, TRIG, HDL, CHOLHDL, VLDL,  LDLCALC No results found for: TSH  Therapeutic Level Labs: No results found for: LITHIUM No results found for: VALPROATE No components found for:  CBMZ  Current Medications: Current Outpatient Medications  Medication Sig Dispense Refill  . aspirin 325 MG tablet Take 325 mg by mouth daily.    Marland Kitchen buPROPion (WELLBUTRIN XL) 150 MG 24 hr tablet Take 1 tablet (150 mg total) by mouth daily. To be taken along with 300 mg 90 tablet 1  . buPROPion (WELLBUTRIN XL) 300 MG 24 hr tablet Take 1 tablet (300 mg total) by mouth daily. To be combined with 150 mg 90 tablet 1  . clomiPHENE (CLOMID) 50 MG tablet 1/2 tab daily 30 tablet 0  . cyanocobalamin (,VITAMIN  B-12,) 1000 MCG/ML injection     . docusate sodium (COLACE) 50 MG capsule Take by mouth.    . escitalopram (LEXAPRO) 20 MG tablet Take 1 tablet (20 mg total) by mouth daily. 90 tablet 1  . gabapentin (NEURONTIN) 300 MG capsule Take by mouth.    Marland Kitchen gemfibrozil (LOPID) 600 MG tablet Take 600 mg by mouth 2 (two) times daily before a meal.    . glucose blood (ONE TOUCH ULTRA TEST) test strip Use as instructed 5 times daily.    . hydrOXYzine (ATARAX/VISTARIL) 10 MG tablet Take 1-2 tablets (10-20 mg total) by mouth 2 (two) times daily as needed for anxiety. 120 tablet 1  . insulin glargine (LANTUS) 100 UNIT/ML injection Inject 70 Units into the skin at bedtime.     . insulin lispro (HUMALOG) 100 UNIT/ML injection 15 Units 3 (three) times daily before meals. Sliding scale    . Insulin Pen Needle (EXEL COMFORT POINT PEN NEEDLE) 29G X 12MM MISC Use one four times daily.    . isosorbide dinitrate (ISORDIL) 30 MG tablet Take 30 mg by mouth 2 (two) times daily.    . isosorbide mononitrate (IMDUR) 30 MG 24 hr tablet     . metFORMIN (GLUCOPHAGE) 1000 MG tablet Take 1,000 mg by mouth 2 (two) times daily with a meal.    . metoprolol succinate (TOPROL-XL) 25 MG 24 hr tablet Take 25 mg by mouth daily.    Marland Kitchen NEEDLE, DISP, 25 G (BD SAFETYGLIDE SHIELDED NEEDLE) 25G X 1"  MISC Use 1 each monthly. for B12 injections    . nitroGLYCERIN (NITROSTAT) 0.4 MG SL tablet Place under the tongue.    Marland Kitchen omeprazole (PRILOSEC) 20 MG capsule Take 20 mg by mouth 2 (two) times daily before a meal.    . orphenadrine (NORFLEX) 100 MG tablet Limit  1 tab po / day or bid  If tolerated 60 tablet 2  . prasugrel (EFFIENT) 10 MG TABS tablet Take 10 mg by mouth daily.    . pravastatin (PRAVACHOL) 40 MG tablet Take 40 mg by mouth daily.    Marland Kitchen RANEXA 500 MG 12 hr tablet Take 500 mg by mouth 2 (two) times daily.  0  . SYRINGE-NEEDLE, DISP, 3 ML (MEDSAVER SYRINGE 3CC/23GX1") 23G X 1" 3 ML MISC Use as directed with  cyanocobalamin    . traZODone (DESYREL) 150 MG tablet Take 1 tablet (150 mg total) by mouth at bedtime. 90 tablet 1  . pioglitazone (ACTOS) 15 MG tablet Take by mouth.     No current facility-administered medications for this visit.      Musculoskeletal: Strength & Muscle Tone: within normal limits Gait & Station: normal Patient leans: N/A  Psychiatric Specialty Exam: Review of Systems  Psychiatric/Behavioral: Positive for depression. The patient is nervous/anxious.   All other systems reviewed and are negative.   Blood pressure 126/75, pulse 73, temperature 98.3 F (36.8 C), temperature source Oral, weight 194 lb (88 kg).Body mass index is 29.5 kg/m.  General Appearance: Casual  Eye Contact:  Fair  Speech:  Clear and Coherent  Volume:  Normal  Mood:  Anxious and Dysphoric  Affect:  Congruent  Thought Process:  Goal Directed and Descriptions of Associations: Intact  Orientation:  Full (Time, Place, and Person)  Thought Content: Logical   Suicidal Thoughts:  No  Homicidal Thoughts:  No  Memory:  Immediate;   Fair Recent;   Fair Remote;   Fair  Judgement:  Fair  Insight:  Fair  Psychomotor  Activity:  Normal  Concentration:  Concentration: Fair and Attention Span: Fair  Recall:  [employer] of Knowledge: Fair  Language: Fair  Akathisia:  No  Handed:  Right   AIMS (if indicated): na  Assets:  Communication Skills Desire for Improvement Social Support  ADL's:  Intact  Cognition: WNL  Sleep:  sleeps at night , but has excessive sleep during the day   Screenings: PHQ2-9     Office Visit from 03/04/2016 in [facility] Procedure visit from 01/21/2016 in [facility] from 12/31/2015 in [place] Office Visit from 11/29/2015 in [facility] from 10/04/2015 in [place]  PHQ-2 Total Score  0  0  0  0  0       Assessment and Plan:Philip is a 67 year old Caucasian male who has a history of depression, chronic pain, presented to the clinic today for a follow-up visit.  Patient reports he continues to struggle with some fatigue and tiredness during the day.  He also struggles with excessive sleep during the day.  Patient also has history of cognitive issues, memory problems.  Patient is currently in psychotherapy with [name] which is going well.  Patient is also on a higher dose of Wellbutrin which was increased few weeks ago. Discussed with patient to continue medication as well as therapy.  Plan as noted below.  Plan  Depression Continue Wellbutrin XL 450 mg p.o. daily Continue Lexapro 20 mg p.o. daily Continue psychotherapy with Ms. Peacock  For anxiety symptoms Lexapro 20 mg p.o. daily. Continue hydroxyzine 10-20 mg p.o. twice daily as needed.  For insomnia Trazodone 150 mg p.o. nightly  For memory problems We will continue to monitor closely.  Discussed with patient to keep checklist, reminders and so on. MMSE done on 09/17/2017-30 out of 30. Patient is also on vitamin B12 replacement. TSH pending  Recent completed Epworth sleep scale and scored 9 . Will monitor his symptoms closely and  will refer him for sleep study if his symptoms gets worse.  More than 50 % of the time was spent for psychoeducation and supportive psychotherapy and care coordination.  This note was generated in part or whole with voice recognition software. Voice recognition is usually quite accurate but there are transcription errors that can and very often do occur. I apologize for any typographical errors that were not detected and corrected.       Ursula Alert, MD 11/25/2017, 8:42 AM

## 2017-11-25 ENCOUNTER — Encounter: Payer: Self-pay | Admitting: Psychiatry

## 2017-12-01 NOTE — Progress Notes (Signed)
   THERAPIST PROGRESS NOTE  Session Time: 61  Participation Level: Active  Behavioral Response: CasualAlertEuthymic  Type of Therapy: Individual Therapy  Treatment Goals addressed: Coping and Diagnosis: Depression  Interventions: CBT and Motivational Interviewing  Summary: Todd Banks is a 67 y.o. male who presents with continued symptoms of his diagnosis. Therapist met with Patient in an initial therapy session to assess current mood and to build rapport. Therapist engaged Patient in discussion about his life and what is going well. Therapist provided support for Patient as he shared details about his life, current stressors, mood, coping skills, his past, and his children. Therapist prompted Patient to discuss his support system and ways that he manages daily stress, anger, and frustrations. LCSW discussed what psychotherapy is and is not and the importance of the therapeutic relationship to include open and honest communication between client and therapist and building trust.  Reviewed advantages and disadvantages of the therapeutic process and limitations to the therapeutic relationship including LCSW's role in maintaining the safety of the client, others and those in client's care.   Suicidal/Homicidal: No   Plan: Return again in 2 weeks.  Diagnosis: Axis I: Depression    Axis II: No diagnosis    Lubertha South, LCSW 11/11/2017

## 2017-12-17 ENCOUNTER — Ambulatory Visit: Payer: Self-pay | Admitting: Psychiatry

## 2017-12-30 ENCOUNTER — Ambulatory Visit: Payer: Medicare Other | Admitting: Licensed Clinical Social Worker

## 2018-01-03 ENCOUNTER — Other Ambulatory Visit: Payer: Self-pay | Admitting: Psychiatry

## 2018-01-12 ENCOUNTER — Other Ambulatory Visit
Admission: RE | Admit: 2018-01-12 | Discharge: 2018-01-12 | Disposition: A | Discharge status: Home / Self Care | Payer: 59 | Source: Ambulatory Visit | Attending: Psychiatry | Admitting: Psychiatry

## 2018-01-12 ENCOUNTER — Other Ambulatory Visit: Payer: Self-pay

## 2018-01-12 ENCOUNTER — Encounter: Payer: Self-pay | Admitting: Psychiatry

## 2018-01-12 ENCOUNTER — Ambulatory Visit (INDEPENDENT_AMBULATORY_CARE_PROVIDER_SITE_OTHER): Payer: 59 | Admitting: Psychiatry

## 2018-01-12 VITALS — BP 137/74 | HR 82 | Temp 97.9°F | Wt 197.2 lb

## 2018-01-12 DIAGNOSIS — F331 Major depressive disorder, recurrent, moderate: Secondary | ICD-10-CM | POA: Diagnosis not present

## 2018-01-12 DIAGNOSIS — F419 Anxiety disorder, unspecified: Secondary | ICD-10-CM | POA: Diagnosis not present

## 2018-01-12 DIAGNOSIS — G894 Chronic pain syndrome: Secondary | ICD-10-CM

## 2018-01-12 LAB — VITAMIN B12: Vitamin B-12: 327 pg/mL (ref 180–914)

## 2018-01-12 LAB — TSH: TSH: 1.387 u[IU]/mL (ref 0.350–4.500)

## 2018-01-12 LAB — FOLATE: FOLATE: 14.2 ng/mL (ref >5.9)

## 2018-01-12 NOTE — Progress Notes (Signed)
[facility] MD OP Progress Note  01/12/2018 2:49 PM MATTIAS Banks  MRN:  [id number]  Chief Complaint: ' I am here for follow up." Chief Complaint    Follow-up; Medication Refill     HPI: Todd Banks is a 67 year old Caucasian male, married, on SSI, lives in [place], has a history of depression, chronic pain, presented to the clinic today for a follow-up visit.  Patient today reports he is in severe pain.  He does have chronic pain syndrome of his lower back which he reports is radiating down to his legs today.  He reports he has reached out to his pain provider who has made medication readjustment.  Patient seems to be restless today due to pain.  He also reports he is anxious and depressed due to the same.  He however reports managing his pain will help him feel better and he does not want any medication readjustment.  Patient reports sleep is good on the trazodone except for some restlessness on and off when he is in pain.  He however reports taking a pain medication helps him to feel better.  Patient reports his family continues to be supportive.  Patient denies any suicidality.  Continues to be in psychotherapy with [name] which is going well.  Patient denies any other concerns today. Visit Diagnosis:    ICD-10-CM   1. MDD (major depressive disorder), recurrent episode, moderate (HCC) F33.1   2. Anxiety disorder, unspecified type F41.9   3. Chronic pain syndrome G89.4     Past Psychiatric History: Reviewed past psychiatric history from my progress note on 10/27/2017.  Past Medical History:  Past Medical History:  Diagnosis Date  . Anemia   . Anxiety   . Chronic back pain   . Constipation   . Coronary artery disease   . DDD (degenerative disc disease), lumbar   . Depression   . Diabetes mellitus without complication ([place])   . Diabetes mellitus, type II ([place])   . Dizziness   . Heart disease   . Hypertension   . Intermittent self-catheterization of bladder   . Kidney stones    . PUD (peptic ulcer disease)   . Shingles     Past Surgical History:  Procedure Laterality Date  . BACK SURGERY    . CARDIAC CATHETERIZATION N/A 08/28/2015   Procedure: Left Heart Cath and Coronary Angiography;  Surgeon: Isaias Cowman, MD;  Location: [facility];  Service: Cardiovascular;  Laterality: N/A;  . CHOLECYSTECTOMY    . COLONOSCOPY WITH PROPOFOL N/A 08/07/2017   Procedure: COLONOSCOPY WITH PROPOFOL;  Surgeon: Manya Silvas, MD;  Location[facility] ENDOSCOPY;  Service: Endoscopy;  Laterality: N/A;  . CORONARY ANGIOPLASTY    . CORONARY STENT PLACEMENT    . ESOPHAGOGASTRODUODENOSCOPY (EGD) WITH PROPOFOL N/A 09/29/2016   Procedure: ESOPHAGOGASTRODUODENOSCOPY (EGD) WITH PROPOFOL;  Surgeon: Manya Silvas, MD;  Location: [facility] ENDOSCOPY;  Service: Endoscopy;  Laterality: N/A;  . ESOPHAGOGASTRODUODENOSCOPY (EGD) WITH PROPOFOL N/A 08/07/2017   Procedure: ESOPHAGOGASTRODUODENOSCOPY (EGD) WITH PROPOFOL;  Surgeon: Manya Silvas, MD;  Location[facility] ENDOSCOPY;  Service: Endoscopy;  Laterality: N/A;  . HAND SURGERY Right     Family Psychiatric History: Reviewed family psychiatric history from my progress note on 10/27/2017  Family History:  Family History  Problem Relation Age of Onset  . Diabetes Mother   . Heart disease Mother   . Hypertension Mother   . Depression Father   . Heart disease Father   . Hypertension Father   . Prostate  cancer Neg Hx   . Bladder Cancer Neg Hx   . Kidney cancer Neg Hx    Substance abuse history: Denies  Social History: Reviewed social history from my progress note on 10/27/2017. Social History   Socioeconomic History  . Marital status: Married    Spouse name: Todd Banks  . Number of children: 4  . Years of education: Not on file  . Highest education level: Some college, no degree  Occupational History    Comment: disabled  Social Needs  . Financial resource strain: Not hard at all  . Food insecurity:    Worry: Never true     Inability: Never true  . Transportation needs:    Medical: No    Non-medical: No  Tobacco Use  . Smoking status: Former Smoker    Last attempt to quit: 07/22/1993    Years since quitting: 24.4  . Smokeless tobacco: Never Used  Substance and Sexual Activity  . Alcohol use: No    Alcohol/week: 0.0 oz  . Drug use: No  . Sexual activity: Not Currently  Lifestyle  . Physical activity:    Days per week: 0 days    Minutes per session: 0 min  . Stress: Not on file  Relationships  . Social connections:    Talks on phone: Never    Gets together: More than three times a week    Attends religious service: More than 4 times per year    Active member of club or organization: No    Attends meetings of clubs or organizations: Never    Relationship status: Married  Other Topics Concern  . Not on file  Social History Narrative  . Not on file    Allergies:  Allergies  Allergen Reactions  . Cephalexin Hives    Other reaction(s): Unknown Other reaction(s): Unknown  . Cephalosporins Swelling    Other Reaction: reacts to Keflex  . Ampicillin Swelling    Other reaction(s): Unknown  . Cefazolin Swelling    Other reaction(s): Other (qualifier value)  . Cefuroxime Axetil Swelling  . Ciprofloxacin Swelling    Other reaction(s): Respiratory distress (finding), Weal  . Clindamycin Swelling  . Doxycycline Swelling    Other reaction(s): Unknown  . Erythromycin Swelling    Other reaction(s): Unknown  . Latex Other (See Comments)    Other reaction(s): BLISTERS  . Levofloxacin Swelling    Other reaction(s): Altered mental status (finding), Itching of Skin  . Other Swelling    All Mycins..  . Penicillins Swelling  . Pork (Porcine) Protein     Other reaction(s): Other (qualifier value)  . Prevacid [Lansoprazole]     Other reaction(s): Unknown Other reaction(s): Unknown    Metabolic Disorder Labs: No results found for: HGBA1C, MPG Lab Results  Component Value Date   PROLACTIN 19.0 (H)  03/26/2017   No results found for: CHOL, TRIG, HDL, CHOLHDL, VLDL, LDLCALC Lab Results  Component Value Date   TSH 1.387 01/12/2018    Therapeutic Level Labs: No results found for: LITHIUM No results found for: VALPROATE No components found for:  CBMZ  Current Medications: Current Outpatient Medications  Medication Sig Dispense Refill  . aspirin 325 MG tablet Take 325 mg by mouth daily.    Marland Kitchen buPROPion (WELLBUTRIN XL) 150 MG 24 hr tablet Take 1 tablet (150 mg total) by mouth daily. To be taken along with 300 mg 90 tablet 1  . buPROPion (WELLBUTRIN XL) 300 MG 24 hr tablet Take 1 tablet (300 mg total) by  mouth daily. To be combined with 150 mg 90 tablet 1  . clomiPHENE (CLOMID) 50 MG tablet 1/2 tab daily 30 tablet 0  . cyanocobalamin (,VITAMIN B-12,) 1000 MCG/ML injection     . docusate sodium (COLACE) 50 MG capsule Take by mouth.    . escitalopram (LEXAPRO) 20 MG tablet Take 1 tablet (20 mg total) by mouth daily. 90 tablet 1  . gabapentin (NEURONTIN) 300 MG capsule Take by mouth.    Marland Kitchen gemfibrozil (LOPID) 600 MG tablet Take 600 mg by mouth 2 (two) times daily before a meal.    . glucose blood (ONE TOUCH ULTRA TEST) test strip Use as instructed 5 times daily.    . hydrOXYzine (ATARAX/VISTARIL) 10 MG tablet TAKE 1-2 TABLETS (10-20 MG TOTAL) BY MOUTH 2 (TWO) TIMES DAILY AS NEEDED FOR ANXIETY. 120 tablet 1  . Insulin Glargine (LANTUS SOLOSTAR) 100 UNIT/ML Solostar Pen INJECT SUBCUTANEOUSLY 70  UNITS NIGHTLY    . insulin glargine (LANTUS) 100 UNIT/ML injection Inject 70 Units into the skin at bedtime.     . insulin lispro (HUMALOG KWIKPEN) 100 UNIT/ML KiwkPen Inject into the skin.    Marland Kitchen insulin lispro (HUMALOG) 100 UNIT/ML injection 15 Units 3 (three) times daily before meals. Sliding scale    . Insulin Pen Needle (EXEL COMFORT POINT PEN NEEDLE) 29G X 12MM MISC Use one four times daily.    . isosorbide dinitrate (ISORDIL) 30 MG tablet Take 30 mg by mouth 2 (two) times daily.    . isosorbide  mononitrate (IMDUR) 30 MG 24 hr tablet     . metFORMIN (GLUCOPHAGE) 1000 MG tablet Take 1,000 mg by mouth 2 (two) times daily with a meal.    . metoprolol succinate (TOPROL-XL) 25 MG 24 hr tablet Take 25 mg by mouth daily.    Marland Kitchen NEEDLE, DISP, 25 G (BD SAFETYGLIDE SHIELDED NEEDLE) 25G X 1" MISC Use 1 each monthly. for B12 injections    . nitroGLYCERIN (NITROSTAT) 0.4 MG SL tablet Place under the tongue.    Marland Kitchen omeprazole (PRILOSEC) 20 MG capsule Take 20 mg by mouth 2 (two) times daily before a meal.    . orphenadrine (NORFLEX) 100 MG tablet Limit  1 tab po / day or bid  If tolerated 60 tablet 2  . prasugrel (EFFIENT) 10 MG TABS tablet Take 10 mg by mouth daily.    . pravastatin (PRAVACHOL) 40 MG tablet Take 40 mg by mouth daily.    Marland Kitchen RANEXA 500 MG 12 hr tablet Take 500 mg by mouth 2 (two) times daily.  0  . SYRINGE-NEEDLE, DISP, 3 ML (MEDSAVER SYRINGE 3CC/23GX1") 23G X 1" 3 ML MISC Use as directed with  cyanocobalamin    . traZODone (DESYREL) 150 MG tablet Take 1 tablet (150 mg total) by mouth at bedtime. 90 tablet 1  . Blood Glucose Monitoring Suppl (ONE TOUCH ULTRA MINI) w/Device KIT     . lisinopril-hydrochlorothiazide (PRINZIDE,ZESTORETIC) 20-25 MG tablet     . omeprazole (PRILOSEC) 40 MG capsule     . pioglitazone (ACTOS) 15 MG tablet Take by mouth.    . sucralfate (CARAFATE) 1 g tablet TAKE 1 TABLET (1 G TOTAL) BY MOUTH 4 (FOUR) TIMES DAILY BEFORE MEALS AND NIGHTLY  11   No current facility-administered medications for this visit.      Musculoskeletal: Strength & Muscle Tone: within normal limits Gait & Station: normal Patient leans: N/A  Psychiatric Specialty Exam: Review of Systems  Musculoskeletal: Positive for back pain and myalgias.  Psychiatric/Behavioral:  The patient is nervous/anxious.   All other systems reviewed and are negative.   Blood pressure 137/74, pulse 82, temperature 97.9 F (36.6 C), temperature source Oral, weight 197 lb 3.2 oz (89.4 kg).Body mass index is  29.98 kg/m.  General Appearance: Casual  Eye Contact:  Fair  Speech:  Normal Rate  Volume:  Normal  Mood:  Anxious and Dysphoric  Affect:  Congruent  Thought Process:  Goal Directed and Descriptions of Associations: Intact  Orientation:  Full (Time, Place, and Person)  Thought Content: Logical   Suicidal Thoughts:  No  Homicidal Thoughts:  No  Memory:  Immediate;   Fair Recent;   Fair Remote;   Fair  Judgement:  Fair  Insight:  Fair  Psychomotor Activity:  Normal  Concentration:  Concentration: Fair and Attention Span: Fair  Recall:  [employer] of Knowledge: Fair  Language: Fair  Akathisia:  No  Handed:  Right  AIMS (if indicated): denies rigidity, stiffness  Assets:  Communication Skills Desire for Improvement Social Support  ADL's:  Intact  Cognition: WNL  Sleep:  restless   Screenings: PHQ2-9     Office Visit from 03/04/2016 in [place] Procedure visit from 01/21/2016 in [place] Clinical Support from 12/31/2015 in [place] Office Visit from 11/29/2015 in [place] Clinical Support from 10/04/2015 in [place]  PHQ-2 Total Score  0  0  0  0  0       Assessment and Plan: Todd Banks is a 67 year old Caucasian male who has a history of depression, chronic pain, presented to the clinic today for a follow-up visit.  Patient continues to struggle with pain which has exacerbated recently.  Patient has appointment scheduled with his pain provider.  Patient otherwise is compliant with his medication and reports good benefit from psychotherapy.  Plan as noted below.  Plan Depression Continue Wellbutrin XL 450 mg p.o. daily Continue Lexapro 20 mg p.o. daily Continue psychotherapy with Ms. Peacock  For anxiety symptoms Continue Lexapro 20 mg p.o.  daily Continue hydroxyzine 10-20 mg p.o. twice daily as needed  For insomnia Trazodone 150 mg p.o. nightly  For memory problems Patient will continue to monitor his symptoms closely.  Discussed with him to keep checklist and reminders. MMSE done on 09/17/2017-30 out of 30.  Patient is also on vitamin B12 replacement. Will get the following labs-TSH, vitamin B12, folate, vitamin D.  Provided lab slips.  Patient completed an Epworth sleep scale and scored 9 recently.  We will continue to monitor his symptoms closely.  Follow up in clinic in 8 weeks.  More than 50 % of the time was spent for psychoeducation and supportive psychotherapy and care coordination.  This note was generated in part or whole with voice recognition software. Voice recognition is usually quite accurate but there are transcription errors that can and very often do occur. I apologize for any typographical errors that were not detected and corrected.       Ursula Alert, MD 01/13/2018, 3:39 PM

## 2018-01-13 ENCOUNTER — Encounter: Payer: Self-pay | Admitting: Psychiatry

## 2018-01-13 ENCOUNTER — Encounter: Payer: Self-pay | Admitting: *Deleted

## 2018-01-13 ENCOUNTER — Observation Stay
Admission: EM | Admit: 2018-01-13 | Discharge: 2018-01-14 | Disposition: A | Discharge status: Home / Self Care | Payer: 59 | Attending: Internal Medicine | Admitting: Internal Medicine

## 2018-01-13 ENCOUNTER — Emergency Department: Payer: 59

## 2018-01-13 ENCOUNTER — Other Ambulatory Visit: Payer: Self-pay

## 2018-01-13 DIAGNOSIS — R079 Chest pain, unspecified: Secondary | ICD-10-CM | POA: Diagnosis present

## 2018-01-13 DIAGNOSIS — Z87442 Personal history of urinary calculi: Secondary | ICD-10-CM | POA: Diagnosis not present

## 2018-01-13 DIAGNOSIS — Z79899 Other long term (current) drug therapy: Secondary | ICD-10-CM | POA: Insufficient documentation

## 2018-01-13 DIAGNOSIS — Z955 Presence of coronary angioplasty implant and graft: Secondary | ICD-10-CM | POA: Diagnosis not present

## 2018-01-13 DIAGNOSIS — G8929 Other chronic pain: Secondary | ICD-10-CM | POA: Insufficient documentation

## 2018-01-13 DIAGNOSIS — E669 Obesity, unspecified: Secondary | ICD-10-CM | POA: Diagnosis not present

## 2018-01-13 DIAGNOSIS — Z91040 Latex allergy status: Secondary | ICD-10-CM | POA: Diagnosis not present

## 2018-01-13 DIAGNOSIS — E785 Hyperlipidemia, unspecified: Secondary | ICD-10-CM | POA: Insufficient documentation

## 2018-01-13 DIAGNOSIS — Z6834 Body mass index (BMI) 34.0-34.9, adult: Secondary | ICD-10-CM | POA: Diagnosis not present

## 2018-01-13 DIAGNOSIS — F419 Anxiety disorder, unspecified: Secondary | ICD-10-CM | POA: Insufficient documentation

## 2018-01-13 DIAGNOSIS — Z91018 Allergy to other foods: Secondary | ICD-10-CM | POA: Diagnosis not present

## 2018-01-13 DIAGNOSIS — Z794 Long term (current) use of insulin: Secondary | ICD-10-CM | POA: Insufficient documentation

## 2018-01-13 DIAGNOSIS — Z7982 Long term (current) use of aspirin: Secondary | ICD-10-CM | POA: Insufficient documentation

## 2018-01-13 DIAGNOSIS — Z880 Allergy status to penicillin: Secondary | ICD-10-CM | POA: Insufficient documentation

## 2018-01-13 DIAGNOSIS — Z881 Allergy status to other antibiotic agents status: Secondary | ICD-10-CM | POA: Insufficient documentation

## 2018-01-13 DIAGNOSIS — E1142 Type 2 diabetes mellitus with diabetic polyneuropathy: Secondary | ICD-10-CM | POA: Diagnosis not present

## 2018-01-13 DIAGNOSIS — F329 Major depressive disorder, single episode, unspecified: Secondary | ICD-10-CM | POA: Diagnosis not present

## 2018-01-13 DIAGNOSIS — I119 Hypertensive heart disease without heart failure: Secondary | ICD-10-CM | POA: Diagnosis not present

## 2018-01-13 LAB — BASIC METABOLIC PANEL
Anion gap: 7 (ref 5–15)
BUN: 28 mg/dL — ABNORMAL HIGH (ref 8–23)
CHLORIDE: 106 mmol/L (ref 98–111)
CO2: 25 mmol/L (ref 22–32)
Calcium: 8.8 mg/dL — ABNORMAL LOW (ref 8.9–10.3)
Creatinine, Ser: 1.38 mg/dL — ABNORMAL HIGH (ref 0.61–1.24)
GFR calc non Af Amer: 52 mL/min — ABNORMAL LOW (ref >60)
GFR, EST AFRICAN AMERICAN: 60 mL/min — AB (ref >60)
GLUCOSE: 230 mg/dL — AB (ref 70–99)
Potassium: 4.5 mmol/L (ref 3.5–5.1)
Sodium: 138 mmol/L (ref 135–145)

## 2018-01-13 LAB — GLUCOSE, CAPILLARY: GLUCOSE-CAPILLARY: 121 mg/dL — AB (ref 70–99)

## 2018-01-13 LAB — CBC
HCT: 36.9 % — ABNORMAL LOW (ref 40.0–52.0)
HEMOGLOBIN: 12.7 g/dL — AB (ref 13.0–18.0)
MCH: 31.4 pg (ref 26.0–34.0)
MCHC: 34.3 g/dL (ref 32.0–36.0)
MCV: 91.3 fL (ref 80.0–100.0)
PLATELETS: 287 10*3/uL (ref 150–440)
RBC: 4.04 MIL/uL — ABNORMAL LOW (ref 4.40–5.90)
RDW: 13.1 % (ref 11.5–14.5)
WBC: 5.8 10*3/uL (ref 3.8–10.6)

## 2018-01-13 LAB — TROPONIN I: Troponin I: <0.03 ng/mL (ref <0.03)

## 2018-01-13 LAB — VITAMIN D 25 HYDROXY (VIT D DEFICIENCY, FRACTURES): Vit D, 25-Hydroxy: 28.7 ng/mL — ABNORMAL LOW (ref 30.0–100.0)

## 2018-01-13 MED ORDER — ASPIRIN EC 325 MG PO TBEC
325.0000 mg | DELAYED_RELEASE_TABLET | Freq: Every day | ORAL | Status: DC
  Administered 2018-01-14: 325 mg via ORAL
  Filled 2018-01-13: qty 1

## 2018-01-13 MED ORDER — TRAZODONE HCL 50 MG PO TABS: 25.0000 mg | ORAL_TABLET | Freq: Every evening | ORAL | Status: DC | PRN

## 2018-01-13 MED ORDER — NITROGLYCERIN 0.4 MG SL SUBL: 0.4000 mg | SUBLINGUAL_TABLET | SUBLINGUAL | Status: DC | PRN

## 2018-01-13 MED ORDER — HYDROXYZINE HCL 10 MG PO TABS
10.0000 mg | ORAL_TABLET | Freq: Two times a day (BID) | ORAL | Status: DC | PRN
  Filled 2018-01-13: qty 2

## 2018-01-13 MED ORDER — CYANOCOBALAMIN 1000 MCG/ML IJ SOLN: 1000.0000 ug | INTRAMUSCULAR | Status: DC

## 2018-01-13 MED ORDER — ESCITALOPRAM OXALATE 10 MG PO TABS
20.0000 mg | ORAL_TABLET | Freq: Every day | ORAL | Status: DC
  Administered 2018-01-14: 20 mg via ORAL
  Filled 2018-01-13: qty 2

## 2018-01-13 MED ORDER — DOCUSATE SODIUM 100 MG PO CAPS
100.0000 mg | ORAL_CAPSULE | Freq: Two times a day (BID) | ORAL | Status: DC
  Administered 2018-01-14: 100 mg via ORAL
  Filled 2018-01-13 (×2): qty 1

## 2018-01-13 MED ORDER — LISINOPRIL 20 MG PO TABS
20.0000 mg | ORAL_TABLET | Freq: Every day | ORAL | Status: DC
  Administered 2018-01-14: 20 mg via ORAL
  Filled 2018-01-13: qty 1

## 2018-01-13 MED ORDER — METOPROLOL SUCCINATE ER 25 MG PO TB24
25.0000 mg | ORAL_TABLET | Freq: Every day | ORAL | Status: DC
  Administered 2018-01-14: 25 mg via ORAL
  Filled 2018-01-13: qty 1

## 2018-01-13 MED ORDER — DOCUSATE SODIUM 100 MG PO CAPS: 100.0000 mg | ORAL_CAPSULE | Freq: Every day | ORAL | Status: DC

## 2018-01-13 MED ORDER — BISACODYL 5 MG PO TBEC: 5.0000 mg | DELAYED_RELEASE_TABLET | Freq: Every day | ORAL | Status: DC | PRN

## 2018-01-13 MED ORDER — INSULIN ASPART 100 UNIT/ML ~~LOC~~ SOLN
0.0000 [IU] | Freq: Three times a day (TID) | SUBCUTANEOUS | Status: DC
  Administered 2018-01-14: 5 [IU] via SUBCUTANEOUS
  Administered 2018-01-14: 1 [IU] via SUBCUTANEOUS
  Filled 2018-01-13 (×2): qty 1

## 2018-01-13 MED ORDER — ACETAMINOPHEN 325 MG PO TABS: 650.0000 mg | ORAL_TABLET | Freq: Four times a day (QID) | ORAL | Status: DC | PRN

## 2018-01-13 MED ORDER — BUPROPION HCL ER (XL) 150 MG PO TB24
150.0000 mg | ORAL_TABLET | Freq: Every day | ORAL | Status: DC
  Administered 2018-01-14: 150 mg via ORAL
  Filled 2018-01-13: qty 1

## 2018-01-13 MED ORDER — NITROGLYCERIN 0.4 MG SL SUBL
0.4000 mg | SUBLINGUAL_TABLET | Freq: Once | SUBLINGUAL | Status: AC
  Administered 2018-01-13: 0.4 mg via SUBLINGUAL
  Filled 2018-01-13: qty 1

## 2018-01-13 MED ORDER — LISINOPRIL-HYDROCHLOROTHIAZIDE 20-25 MG PO TABS: 1.0000 | ORAL_TABLET | Freq: Every day | ORAL | Status: DC

## 2018-01-13 MED ORDER — HYDROCHLOROTHIAZIDE 25 MG PO TABS
25.0000 mg | ORAL_TABLET | Freq: Every day | ORAL | Status: DC
  Administered 2018-01-14: 25 mg via ORAL
  Filled 2018-01-13: qty 1

## 2018-01-13 MED ORDER — ONDANSETRON HCL 4 MG/2ML IJ SOLN: 4.0000 mg | Freq: Four times a day (QID) | INTRAMUSCULAR | Status: DC | PRN

## 2018-01-13 MED ORDER — GABAPENTIN 300 MG PO CAPS
300.0000 mg | ORAL_CAPSULE | Freq: Every day | ORAL | Status: DC
  Administered 2018-01-13: 300 mg via ORAL
  Filled 2018-01-13: qty 1

## 2018-01-13 MED ORDER — GEMFIBROZIL 600 MG PO TABS
600.0000 mg | ORAL_TABLET | Freq: Two times a day (BID) | ORAL | Status: DC
  Administered 2018-01-14: 600 mg via ORAL
  Filled 2018-01-13 (×2): qty 1

## 2018-01-13 MED ORDER — INSULIN GLARGINE 100 UNIT/ML ~~LOC~~ SOLN
35.0000 [IU] | Freq: Once | SUBCUTANEOUS | Status: AC
  Administered 2018-01-13: 35 [IU] via SUBCUTANEOUS
  Filled 2018-01-13: qty 0.35

## 2018-01-13 MED ORDER — RANOLAZINE ER 500 MG PO TB12
500.0000 mg | ORAL_TABLET | Freq: Two times a day (BID) | ORAL | Status: DC
  Administered 2018-01-13 – 2018-01-14 (×2): 500 mg via ORAL
  Filled 2018-01-13 (×3): qty 1

## 2018-01-13 MED ORDER — ONDANSETRON HCL 4 MG PO TABS: 4.0000 mg | ORAL_TABLET | Freq: Four times a day (QID) | ORAL | Status: DC | PRN

## 2018-01-13 MED ORDER — METFORMIN HCL 500 MG PO TABS: 1000.0000 mg | ORAL_TABLET | Freq: Two times a day (BID) | ORAL | Status: DC

## 2018-01-13 MED ORDER — ACETAMINOPHEN 650 MG RE SUPP: 650.0000 mg | Freq: Four times a day (QID) | RECTAL | Status: DC | PRN

## 2018-01-13 MED ORDER — BUPROPION HCL ER (XL) 150 MG PO TB24
300.0000 mg | ORAL_TABLET | Freq: Every day | ORAL | Status: DC
  Administered 2018-01-14: 300 mg via ORAL
  Filled 2018-01-13: qty 2

## 2018-01-13 MED ORDER — ASPIRIN 81 MG PO CHEW
324.0000 mg | CHEWABLE_TABLET | Freq: Once | ORAL | Status: AC
  Administered 2018-01-13: 324 mg via ORAL
  Filled 2018-01-13: qty 4

## 2018-01-13 MED ORDER — ORPHENADRINE CITRATE ER 100 MG PO TB12: 100.0000 mg | ORAL_TABLET | Freq: Every day | ORAL | Status: DC

## 2018-01-13 MED ORDER — HYDROCODONE-ACETAMINOPHEN 5-325 MG PO TABS
1.0000 | ORAL_TABLET | ORAL | Status: DC | PRN
  Administered 2018-01-13 – 2018-01-14 (×3): 2 via ORAL
  Filled 2018-01-13 (×3): qty 2

## 2018-01-13 MED ORDER — PRASUGREL HCL 10 MG PO TABS
10.0000 mg | ORAL_TABLET | Freq: Every day | ORAL | Status: DC
  Administered 2018-01-14: 10 mg via ORAL
  Filled 2018-01-13: qty 1

## 2018-01-13 MED ORDER — INSULIN ASPART 100 UNIT/ML ~~LOC~~ SOLN: 0.0000 [IU] | Freq: Every day | SUBCUTANEOUS | Status: DC

## 2018-01-13 MED ORDER — PRAVASTATIN SODIUM 40 MG PO TABS
40.0000 mg | ORAL_TABLET | Freq: Every day | ORAL | Status: DC
  Administered 2018-01-14: 40 mg via ORAL
  Filled 2018-01-13: qty 1

## 2018-01-13 MED ORDER — HEPARIN SODIUM (PORCINE) 5000 UNIT/ML IJ SOLN: 5000.0000 [IU] | Freq: Three times a day (TID) | INTRAMUSCULAR | Status: DC

## 2018-01-13 MED ORDER — TRAZODONE HCL 50 MG PO TABS
150.0000 mg | ORAL_TABLET | Freq: Every day | ORAL | Status: DC
  Administered 2018-01-13: 150 mg via ORAL
  Filled 2018-01-13: qty 1

## 2018-01-13 MED ORDER — SUCRALFATE 1 G PO TABS
1.0000 g | ORAL_TABLET | Freq: Three times a day (TID) | ORAL | Status: DC
  Administered 2018-01-13 – 2018-01-14 (×3): 1 g via ORAL
  Filled 2018-01-13 (×3): qty 1

## 2018-01-13 NOTE — ED Triage Notes (Addendum)
Pt ambulatory to triage.  Pt has chest pain in left side of chest.  Pt also has exertional sob.  Nausea today.  Reports 3 cardiac stents.   Pt alert.  Speech clear.

## 2018-01-13 NOTE — H&P (Signed)
[place] [facility] [place] NAME: Todd Banks    MR#:  [id number]  DATE OF BIRTH:  Dec 15, 1950  DATE OF ADMISSION:  01/13/2018  PRIMARY CARE PHYSICIAN: Maryland Pink, MD   REQUESTING/REFERRING PHYSICIAN: Dr. Corky Downs  CHIEF COMPLAINT: Pain   Chief Complaint  Patient presents with  . Chest Pain    HISTORY OF PRESENT ILLNESS:  Todd Banks  is a 67 y.o. male with a known history of angina, CAD, PCI who follows up with Dr. Saralyn Pilar comes in because of chest pain.  Patient noted to have back pain 2 weeks ago but noted to have chest pain since yesterday mainly midsternal area going towards the throat.  No radiation to left arm.  No cough, no fever, no associated diaphoresis or sweating.  Patient did not take any medicine to help with chest pain.  Troponins have been negative, EKG unremarkable.  But because of his history of CAD, PCI admitting to telemetry for high risk chest pain.  She has no chest pain now, appears comfortable, denies any complaints.  PAST MEDICAL HISTORY:   Past Medical History:  Diagnosis Date  . Anemia   . Anxiety   . Chronic back pain   . Constipation   . Coronary artery disease   . DDD (degenerative disc disease), lumbar   . Depression   . Diabetes mellitus without complication ([place])   . Diabetes mellitus, type II ([place])   . Dizziness   . Heart disease   . Hypertension   . Intermittent self-catheterization of bladder   . Kidney stones   . PUD (peptic ulcer disease)   . Shingles     PAST SURGICAL HISTOIRY:   Past Surgical History:  Procedure Laterality Date  . BACK SURGERY    . CARDIAC CATHETERIZATION N/A 08/28/2015   Procedure: Left Heart Cath and Coronary Angiography;  Surgeon: Isaias Cowman, MD;  Location: [facility];  Service: Cardiovascular;  Laterality: N/A;  . CHOLECYSTECTOMY    . COLONOSCOPY WITH PROPOFOL N/A 08/07/2017   Procedure: COLONOSCOPY WITH PROPOFOL;  Surgeon: Manya Silvas, MD;  Location: [facility] ENDOSCOPY;  Service: Endoscopy;  Laterality: N/A;  . CORONARY ANGIOPLASTY    . CORONARY STENT PLACEMENT    . ESOPHAGOGASTRODUODENOSCOPY (EGD) WITH PROPOFOL N/A 09/29/2016   Procedure: ESOPHAGOGASTRODUODENOSCOPY (EGD) WITH PROPOFOL;  Surgeon: Manya Silvas, MD;  Location: [facility] ENDOSCOPY;  Service: Endoscopy;  Laterality: N/A;  . ESOPHAGOGASTRODUODENOSCOPY (EGD) WITH PROPOFOL N/A 08/07/2017   Procedure: ESOPHAGOGASTRODUODENOSCOPY (EGD) WITH PROPOFOL;  Surgeon: Manya Silvas, MD;  Location: [facility] ENDOSCOPY;  Service: Endoscopy;  Laterality: N/A;  . HAND SURGERY Right     SOCIAL HISTORY:   Social History   Tobacco Use  . Smoking status: Former Smoker    Last attempt to quit: 07/22/1993    Years since quitting: 24.4  . Smokeless tobacco: Never Used  Substance Use Topics  . Alcohol use: No    Alcohol/week: 0.0 oz    FAMILY HISTORY:   Family History  Problem Relation Age of Onset  . Diabetes Mother   . Heart disease Mother   . Hypertension Mother   . Depression Father   . Heart disease Father   . Hypertension Father   . Prostate cancer Neg Hx   . Bladder Cancer Neg Hx   . Kidney cancer Neg Hx     DRUG ALLERGIES:   Allergies  Allergen Reactions  . Cephalexin Hives    Other reaction(s): Unknown  Other reaction(s): Unknown  . Cephalosporins Swelling    Other Reaction: reacts to Keflex  . Penicillins Swelling    Childhood allergy Has patient had a PCN reaction causing immediate rash, facial/tongue/throat swelling, SOB or lightheadedness with hypotension: Unknown Has patient had a PCN reaction causing severe rash involving mucus membranes or skin necrosis: Unknown Has patient had a PCN reaction that required hospitalization: Unknown Has patient had a PCN reaction occurring within the last 10 years: No If all of the above answers are "NO", then may proceed with Cephalosporin use.   . Ampicillin Swelling    Other reaction(s): Unknown  . Cefazolin  Swelling    Other reaction(s): Other (qualifier value)  . Cefuroxime Axetil Swelling  . Ciprofloxacin Swelling    Other reaction(s): Respiratory distress (finding), Weal  . Clindamycin Swelling  . Doxycycline Swelling    Other reaction(s): Unknown  . Erythromycin Swelling    Other reaction(s): Unknown  . Latex Other (See Comments)    Other reaction(s): BLISTERS  . Levofloxacin Swelling    Other reaction(s): Altered mental status (finding), Itching of Skin  . Other Swelling    All Mycins..  . Pork (Porcine) Protein     Other reaction(s): Other (qualifier value)  . Prevacid [Lansoprazole]     Other reaction(s): Unknown Other reaction(s): Unknown    REVIEW OF SYSTEMS:  CONSTITUTIONAL: No fever, fatigue or weakness.  EYES: No blurred or double vision.  EARS, NOSE, AND THROAT: No tinnitus or ear pain.  RESPIRATORY: No cough, shortness of breath, wheezing or hemoptysis.  CARDIOVASCULAR: No chest pain, orthopnea, edema.  GASTROINTESTINAL: No nausea, vomiting, diarrhea or abdominal pain.  GENITOURINARY: No dysuria, hematuria.  ENDOCRINE: No polyuria, nocturia,  HEMATOLOGY: No anemia, easy bruising or bleeding SKIN: No rash or lesion. MUSCULOSKELETAL: No joint pain or arthritis.   NEUROLOGIC: No tingling, numbness, weakness.  PSYCHIATRY: No anxiety or depression.   MEDICATIONS AT HOME:   Prior to Admission medications   Medication Sig Start Date End Date Taking? Authorizing Provider  aspirin 325 MG tablet Take 325 mg by mouth daily.   Yes [provider]  Blood Glucose Monitoring Suppl (ONE TOUCH ULTRA MINI) w/Device KIT  12/16/17  Yes [provider]  buPROPion (WELLBUTRIN XL) 150 MG 24 hr tablet Take 1 tablet (150 mg total) by mouth daily. To be taken along with 300 mg 10/27/17  Yes Eappen, Ria Clock, MD  buPROPion (WELLBUTRIN XL) 300 MG 24 hr tablet Take 1 tablet (300 mg total) by mouth daily. To be combined with 150 mg 10/27/17  Yes Eappen, Ria Clock, MD   cyanocobalamin (,VITAMIN B-12,) 1000 MCG/ML injection Inject 1,000 mcg into the muscle every 30 (thirty) days.  12/28/15  Yes [provider]  docusate sodium (COLACE) 50 MG capsule Take 100 mg by mouth daily.    Yes [provider]  escitalopram (LEXAPRO) 20 MG tablet Take 1 tablet (20 mg total) by mouth daily. 09/17/17  Yes Ursula Alert, MD  gabapentin (NEURONTIN) 300 MG capsule Take 300 mg by mouth at bedtime.  09/22/17 09/22/18 Yes [provider]  gemfibrozil (LOPID) 600 MG tablet Take 600 mg by mouth 2 (two) times daily before a meal.   Yes [provider]  glucose blood (ONE TOUCH ULTRA TEST) test strip Use as instructed 5 times daily. 02/16/14  Yes [provider]  hydrOXYzine (ATARAX/VISTARIL) 10 MG tablet TAKE 1-2 TABLETS (10-20 MG TOTAL) BY MOUTH 2 (TWO) TIMES DAILY AS NEEDED FOR ANXIETY. 01/04/18  Yes Ursula Alert, MD  Insulin Glargine (LANTUS SOLOSTAR) 100 UNIT/ML Solostar Pen INJECT SUBCUTANEOUSLY 70  UNITS NIGHTLY 12/28/17  Yes [provider]  insulin lispro (HUMALOG KWIKPEN) 100 UNIT/ML KiwkPen Inject 15 Units into the skin 3 (three) times daily.  12/28/17  Yes [provider]  Insulin Pen Needle (EXEL COMFORT POINT PEN NEEDLE) 29G X 12MM MISC Use one four times daily. 09/30/15  Yes [provider]  isosorbide mononitrate (IMDUR) 30 MG 24 hr tablet Take 30 mg by mouth daily.  02/05/16  Yes [provider]  lisinopril-hydrochlorothiazide (PRINZIDE,ZESTORETIC) 20-25 MG tablet Take 1 tablet by mouth daily.  12/20/17  Yes [provider]  metFORMIN (GLUCOPHAGE) 1000 MG tablet Take 1,000 mg by mouth 2 (two) times daily with a meal.   Yes [provider]  metoprolol succinate (TOPROL-XL) 25 MG 24 hr tablet Take 25 mg by mouth daily.   Yes [provider]  NEEDLE, DISP, 25 G (BD SAFETYGLIDE SHIELDED NEEDLE) 25G X 1" MISC Use 1 each monthly. for B12 injections 06/22/15  Yes [provider]  nitroGLYCERIN (NITROSTAT) 0.4 MG SL tablet Place 0.4 mg under the tongue every 5 (five) minutes as needed for chest pain.  09/04/15  Yes [provider]  omeprazole (PRILOSEC) 40 MG capsule Take 40 mg by mouth daily.  12/20/17  Yes [provider]  orphenadrine (NORFLEX) 100 MG tablet Limit  1 tab po / day or bid  If tolerated Patient taking differently: Take 100 mg by mouth at bedtime.  03/04/16  Yes Mohammed Kindle, MD  prasugrel (EFFIENT) 10 MG TABS tablet Take 10 mg by mouth daily.   Yes [provider]  pravastatin (PRAVACHOL) 40 MG tablet Take 40 mg by mouth daily.   Yes [provider]  RANEXA 500 MG 12 hr tablet Take 500 mg by mouth 2 (two) times daily. 12/18/16  Yes [provider]  sucralfate (CARAFATE) 1 g tablet TAKE 1 TABLET (1 G TOTAL) BY MOUTH 4 (FOUR) TIMES DAILY BEFORE MEALS AND NIGHTLY 11/27/17  Yes [provider]  SYRINGE-NEEDLE, DISP, 3 ML (MEDSAVER SYRINGE 3CC/23GX1") 23G X 1" 3 ML MISC Use as directed with  cyanocobalamin 12/31/15  Yes [provider]  traZODone (DESYREL) 150 MG tablet Take 1 tablet (150 mg total) by mouth at bedtime. 09/17/17  Yes Ursula Alert, MD  clomiPHENE (CLOMID) 50 MG tablet 1/2 tab daily Patient not taking: Reported on 01/13/2018 04/20/17   [name] A, PA-C      VITAL SIGNS:  Blood pressure 134/83, pulse 72, temperature 97.6 F (36.4 C), temperature source Oral, resp. rate (!) 31, height '5\' 8"'$  (1.727 m), weight 88 kg (194 lb), SpO2 97 %.  PHYSICAL EXAMINATION:  GENERAL:  67 y.o.-year-old patient lying in the bed with no acute distress.  EYES: Pupils equal, round, reactive to light and accommodation. No scleral icterus. Extraocular muscles intact.  HEENT: Head atraumatic, normocephalic. Oropharynx and nasopharynx clear.  NECK:  Supple, no jugular venous distention. No thyroid enlargement, no tenderness.  LUNGS: Normal breath sounds bilaterally, no wheezing, rales,rhonchi  or crepitation. No use of accessory muscles of respiration.  CARDIOVASCULAR: S1, S2 normal. No murmurs, rubs, or gallops.  ABDOMEN: Soft, nontender, nondistended. Bowel sounds present. No organomegaly or mass.  EXTREMITIES: No pedal edema, cyanosis, or clubbing.  NEUROLOGIC: Cranial nerves II through XII are intact. Muscle strength 5/5 in all extremities. Sensation intact. Gait not checked.  PSYCHIATRIC: The patient is alert and oriented x 3.  SKIN: No obvious rash, lesion,  or ulcer.   LABORATORY PANEL:   CBC Recent Labs  Lab 01/13/18 1639  WBC 5.8  HGB 12.7*  HCT 36.9*  PLT 287   ------------------------------------------------------------------------------------------------------------------  Chemistries  Recent Labs  Lab 01/13/18 1639  NA 138  K 4.5  CL 106  CO2 25  GLUCOSE 230*  BUN 28*  CREATININE 1.38*  CALCIUM 8.8*   ------------------------------------------------------------------------------------------------------------------  Cardiac Enzymes Recent Labs  Lab 01/13/18 1639  TROPONINI <0.03   ------------------------------------------------------------------------------------------------------------------  RADIOLOGY:  Dg Chest 2 View  Result Date: 01/13/2018 CLINICAL DATA:  Left-sided chest pain EXAM: CHEST - 2 VIEW COMPARISON:  07/02/2011 FINDINGS: The heart size and mediastinal contours are within normal limits. Both lungs are clear. The visualized skeletal structures are unremarkable. IMPRESSION: No active cardiopulmonary disease. Electronically Signed   By: Kathreen Devoid   On: 01/13/2018 16:57    EKG:   Orders placed or performed during the hospital encounter of 01/13/18  . EKG 12-Lead  . EKG 12-Lead  . ED EKG within 10 minutes  . ED EKG within 10 minutes  EKG showed sinus rhythm without evidence of ischemia with normal ST-T changes.  IMPRESSION AND PLAN:   67 year old male patient with history of hypertension, chronic angina, CAD with PCI 3  stents long time back who follows up with Dr. Saralyn Pilar comes in with midsternal chest pain without any evidence of EKG changes are troponins.  Patient is admitted to observation status for his chest pain, cycle the troponins, continue aspirin, statins, beta-blockers, ACE inhibitors, Effient, Ranexa and consult cardiology from [facility] clinic in case if troponins are elevated. 2.  Depression patient also has severe anxiety.  Continue Lexapro, Wellbutrin.  3 diabetes mellitus type 2: Continue metformin, sliding scale insulin with coverage.  Resume Lantus from tomorrow.  All the records are reviewed and case discussed with ED provider. Management plans discussed with the patient, family and they are in agreement.   CODE STATUS: full  TOTAL TIME TAKING CARE OF THIS PATIENT: 55 minutes.    Epifanio Lesches M.D on 01/13/2018 at 8:59 PM  Between 7am to 6pm - Pager - [phone]  After 6pm go to [email or web address] - password [email or web address] [facility]  Office  [phone]  CC: Primary care physician; Maryland Pink, MD  Note: This dictation was prepared with Dragon dictation along with smaller phrase technology. Any transcriptional errors that result from this process are unintentional.

## 2018-01-13 NOTE — ED Notes (Signed)
ED Provider at bedside. 

## 2018-01-13 NOTE — ED Notes (Signed)
Pt in ED bed 16 w/ family at bedside.  Resting comfortably.  Denies CP at this time.  Took his own 15mg  Oxycontin for chronic back pain while in waiting room.  VSS.  Awaiting EDP.

## 2018-01-13 NOTE — ED Notes (Signed)
Admitting provider at bedside.

## 2018-01-13 NOTE — ED Notes (Signed)
Report called to [facility] 2A.  Pt called wife and updated on admission and room number. Pt transferred by RN to room w/monitor.

## 2018-01-13 NOTE — ED Provider Notes (Signed)
[facility] Emergency Department Provider Note   ____________________________________________    I have reviewed the triage vital signs and the nursing notes.   HISTORY  Chief Complaint Chest Pain     HPI Todd Banks is a 67 y.o. male with a history of coronary artery disease, diabetes, hypertension who presents with complaints of chest pain.  Patient reports intermittent chest pain throughout the day which has at times been severe.  He describes as a central chest pressure.  Has not taken anything for this.  Nothing seems to make it better or worse.  Has a history of "2-3 stents placed "review of records demonstrates last cath in 2017 with significant disease throughout.  Dr. Saralyn Pilar is his cardiologist   Past Medical History:  Diagnosis Date  . Anemia   . Anxiety   . Chronic back pain   . Constipation   . Coronary artery disease   . DDD (degenerative disc disease), lumbar   . Depression   . Diabetes mellitus without complication ([place])   . Diabetes mellitus, type II ([place])   . Dizziness   . Heart disease   . Hypertension   . Intermittent self-catheterization of bladder   . Kidney stones   . PUD (peptic ulcer disease)   . Shingles     Patient Active Problem List   Diagnosis Date Noted  . Obesity (BMI 30.0-34.9) 04/16/2016  . S/P cardiac catheterization 09/04/2015  . Uncontrolled type 2 diabetes mellitus with hyperglycemia, with long-term current use of insulin ([place]) 03/04/2015  . 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 DDD (degenerative disc disease), lumbosacral 11/19/2014  .  lumbar 11/19/2014  . DJD (degenerative joint disease)shoulder 11/19/2014  . Lumbar post-laminectomy syndrome 11/19/2014  . Chest pain on exertion 02/15/2014  . Anemia 01/09/2014  . Chronic epigastric pain 01/09/2014  . Constipation, chronic 01/09/2014  . DOE (dyspnea on exertion) 01/09/2014  . Elevated alkaline phosphatase level  01/09/2014  . Occult blood positive stool 01/09/2014  . Bulging lumbar disc 11/11/2013  . Hyperlipemia 11/11/2013  . HTN (hypertension) 11/11/2013  . H/O cardiac catheterization 11/05/1995  . History of PTCA 01/03/1993    Past Surgical History:  Procedure Laterality Date  . BACK SURGERY    . CARDIAC CATHETERIZATION N/A 08/28/2015   Procedure: Left Heart Cath and Coronary Angiography;  Surgeon: Isaias Cowman, MD;  Location: [place] [facility];  Service: Cardiovascular;  Laterality: N/A;  . CHOLECYSTECTOMY    . COLONOSCOPY WITH PROPOFOL N/A 08/07/2017   Procedure: COLONOSCOPY WITH PROPOFOL;  Surgeon: Manya Silvas, MD;  Location: [facility] ENDOSCOPY;  Service: Endoscopy;  Laterality: N/A;  . CORONARY ANGIOPLASTY    . CORONARY STENT PLACEMENT    . ESOPHAGOGASTRODUODENOSCOPY (EGD) WITH PROPOFOL N/A 09/29/2016   Procedure: ESOPHAGOGASTRODUODENOSCOPY (EGD) WITH PROPOFOL;  Surgeon: Manya Silvas, MD;  Location: [facility] ENDOSCOPY;  Service: Endoscopy;  Laterality: N/A;  . ESOPHAGOGASTRODUODENOSCOPY (EGD) WITH PROPOFOL N/A 08/07/2017   Procedure: ESOPHAGOGASTRODUODENOSCOPY (EGD) WITH PROPOFOL;  Surgeon: Manya Silvas, MD;  Location: [facility] ENDOSCOPY;  Service: Endoscopy;  Laterality: N/A;  . HAND SURGERY Right     Prior to Admission medications   Medication Sig Start Date End Date Taking? Authorizing Provider  docusate sodium (COLACE) 50 MG capsule Take by mouth.   Yes [provider]  aspirin 325 MG tablet Take 325 mg by mouth daily.    [provider]  Blood Glucose Monitoring Suppl (ONE TOUCH ULTRA MINI) w/Device KIT  12/16/17  [provider]  buPROPion (WELLBUTRIN XL) 150 MG 24 hr tablet Take 1 tablet (150 mg total) by mouth daily. To be taken along with 300 mg 10/27/17   Ursula Alert, MD  buPROPion (WELLBUTRIN XL) 300 MG 24 hr tablet Take 1 tablet (300 mg total) by mouth daily. To be combined with 150 mg 10/27/17   Ursula Alert, MD  clomiPHENE (CLOMID)  50 MG tablet 1/2 tab daily 04/20/17   [name] A, PA-C  cyanocobalamin (,VITAMIN B-12,) 1000 MCG/ML injection  12/28/15   [provider]  escitalopram (LEXAPRO) 20 MG tablet Take 1 tablet (20 mg total) by mouth daily. 09/17/17   Ursula Alert, MD  gabapentin (NEURONTIN) 300 MG capsule Take by mouth. 09/22/17 09/22/18  [provider]  gemfibrozil (LOPID) 600 MG tablet Take 600 mg by mouth 2 (two) times daily before a meal.    [provider]  glucose blood (ONE TOUCH ULTRA TEST) test strip Use as instructed 5 times daily. 02/16/14   [provider]  hydrOXYzine (ATARAX/VISTARIL) 10 MG tablet TAKE 1-2 TABLETS (10-20 MG TOTAL) BY MOUTH 2 (TWO) TIMES DAILY AS NEEDED FOR ANXIETY. 01/04/18   Ursula Alert, MD  Insulin Glargine (LANTUS SOLOSTAR) 100 UNIT/ML Solostar Pen INJECT SUBCUTANEOUSLY 70  UNITS NIGHTLY 12/28/17   [provider]  insulin glargine (LANTUS) 100 UNIT/ML injection Inject 70 Units into the skin at bedtime.     [provider]  insulin lispro (HUMALOG KWIKPEN) 100 UNIT/ML KiwkPen Inject into the skin. 12/28/17   [provider]  insulin lispro (HUMALOG) 100 UNIT/ML injection 15 Units 3 (three) times daily before meals. Sliding scale    [provider]  Insulin Pen Needle (EXEL COMFORT POINT PEN NEEDLE) 29G X 12MM MISC Use one four times daily. 09/30/15   [provider]  isosorbide dinitrate (ISORDIL) 30 MG tablet Take 30 mg by mouth 2 (two) times daily.    [provider]  isosorbide mononitrate (IMDUR) 30 MG 24 hr tablet  02/05/16   [provider]  lisinopril-hydrochlorothiazide (PRINZIDE,ZESTORETIC) 20-25 MG tablet  12/20/17   [provider]  metFORMIN (GLUCOPHAGE) 1000 MG tablet Take 1,000 mg by mouth 2 (two) times daily with a meal.    [provider]  metoprolol succinate (TOPROL-XL) 25 MG 24 hr tablet Take 25 mg by mouth daily.    [provider]  NEEDLE,  DISP, 25 G (BD SAFETYGLIDE SHIELDED NEEDLE) 25G X 1" MISC Use 1 each monthly. for B12 injections 06/22/15   [provider]  nitroGLYCERIN (NITROSTAT) 0.4 MG SL tablet Place under the tongue. 09/04/15   [provider]  omeprazole (PRILOSEC) 20 MG capsule Take 20 mg by mouth 2 (two) times daily before a meal.    [provider]  omeprazole (PRILOSEC) 40 MG capsule  12/20/17   [provider]  orphenadrine (NORFLEX) 100 MG tablet Limit  1 tab po / day or bid  If tolerated 03/04/16   Mohammed Kindle, MD  pioglitazone (ACTOS) 15 MG tablet Take by mouth. 09/30/15 09/29/16  [provider]  prasugrel (EFFIENT) 10 MG TABS tablet Take 10 mg by mouth daily.    [provider]  pravastatin (PRAVACHOL) 40 MG tablet Take 40 mg by mouth daily.    [provider]  RANEXA 500 MG 12 hr tablet Take 500 mg by mouth 2 (two) times daily. 12/18/16   [provider]  sucralfate (CARAFATE) 1 g tablet TAKE 1 TABLET (1 G TOTAL) BY  MOUTH 4 (FOUR) TIMES DAILY BEFORE MEALS AND NIGHTLY 11/27/17   [provider]  SYRINGE-NEEDLE, DISP, 3 ML (MEDSAVER SYRINGE 3CC/23GX1") 23G X 1" 3 ML MISC Use as directed with  cyanocobalamin 12/31/15   [provider]  traZODone (DESYREL) 150 MG tablet Take 1 tablet (150 mg total) by mouth at bedtime. 09/17/17   Ursula Alert, MD     Allergies Cephalexin; Cephalosporins; Penicillins; Ampicillin; Cefazolin; Cefuroxime axetil; Ciprofloxacin; Clindamycin; Doxycycline; Erythromycin; Latex; Levofloxacin; Other; Pork (porcine) protein; and Prevacid [lansoprazole]  Family History  Problem Relation Age of Onset  . Diabetes Mother   . Heart disease Mother   . Hypertension Mother   . Depression Father   . Heart disease Father   . Hypertension Father   . Prostate cancer Neg Hx   . Bladder Cancer Neg Hx   . Kidney cancer Neg Hx     Social History Social History   Tobacco Use  . Smoking status: Former Smoker     Last attempt to quit: 07/22/1993    Years since quitting: 24.4  . Smokeless tobacco: Never Used  Substance Use Topics  . Alcohol use: No    Alcohol/week: 0.0 oz  . Drug use: No    Review of Systems  Constitutional: No fever/chills Eyes: No visual changes.  ENT: No sore throat. Cardiovascular: As above Respiratory: Denies shortness of breath. Gastrointestinal: No abdominal pain.  No nausea, no vomiting.   Genitourinary: Negative for dysuria. Musculoskeletal: Negative for back pain. Skin: Negative for rash. Neurological: Negative for headaches or weakness   ____________________________________________   PHYSICAL EXAM:  VITAL SIGNS: ED Triage Vitals  Enc Vitals Group     BP 01/13/18 1703 137/74     Pulse Rate 01/13/18 1636 85     Resp 01/13/18 1636 20     Temp 01/13/18 1636 98.4 F (36.9 C)     Temp Source 01/13/18 1636 Oral     SpO2 01/13/18 1636 96 %     Weight 01/13/18 1637 88 kg (194 lb)     Height 01/13/18 1637 1.727 m (_0 )     Head Circumference --      Peak Flow --      Pain Score 01/13/18 1637 6     Pain Loc --      Pain Edu? --      Excl. in [place]? --     Constitutional: Alert and oriented. No acute distress. Pleasant and interactive Eyes: Conjunctivae are normal.   Nose: No congestion/rhinnorhea. Mouth/Throat: Mucous membranes are moist.    Cardiovascular: Normal rate, regular rhythm. Grossly normal heart sounds.  Good peripheral circulation.  No rash Respiratory: Normal respiratory effort.  No retractions. Lungs CTAB. Gastrointestinal: Soft and nontender. No distention.  No CVA tenderness. Genitourinary: deferred Musculoskeletal: No lower extremity tenderness nor edema.  Warm and well perfused Neurologic:  Normal speech and language. No gross focal neurologic deficits are appreciated.  Skin:  Skin is warm, dry and intact. No rash noted. Psychiatric: Mood and affect are normal. Speech and behavior are  normal.  ____________________________________________   LABS (all labs ordered are listed, but only abnormal results are displayed)  Labs Reviewed  BASIC METABOLIC PANEL - Abnormal; Notable for the following components:      Result Value   Glucose, Bld 230 (*)    BUN 28 (*)    Creatinine, Ser 1.38 (*)    Calcium 8.8 (*)    GFR calc non Af Amer 52 (*)  GFR calc Af Amer 60 (*)    All other components within normal limits  CBC - Abnormal; Notable for the following components:   RBC 4.04 (*)    Hemoglobin 12.7 (*)    HCT 36.9 (*)    All other components within normal limits  TROPONIN I   ____________________________________________  EKG  ED ECG REPORT I, Lavonia Drafts, the attending physician, personally viewed and interpreted this ECG.  Date: 01/13/2018  Rhythm: normal sinus rhythm QRS Axis: normal Intervals: normal ST/T Wave abnormalities: normal Narrative Interpretation: no evidence of acute ischemia  ____________________________________________  RADIOLOGY  Chest x-ray negative ____________________________________________   PROCEDURES  Procedure(s) performed: No  Procedures   Critical Care performed: No ____________________________________________   INITIAL IMPRESSION / ASSESSMENT AND PLAN / ED COURSE  Pertinent labs & imaging results that were available during my care of the patient were reviewed by me and considered in my medical decision making (see chart for details).  Patient overall well-appearing however significant history of coronary artery disease with what sounds like waxing and waning chest pain today, initial troponin and EKG reassuring.  Will admit for observation and trend troponins given history of CAD    ____________________________________________   FINAL CLINICAL IMPRESSION(S) / ED DIAGNOSES  Final diagnoses:  Chest pain, unspecified type        Note:  This document was prepared using Dragon voice recognition software  and may include unintentional dictation errors.    Lavonia Drafts, MD 01/13/18 2019

## 2018-01-14 DIAGNOSIS — R079 Chest pain, unspecified: Secondary | ICD-10-CM | POA: Diagnosis not present

## 2018-01-14 LAB — BASIC METABOLIC PANEL
Anion gap: 7 (ref 5–15)
BUN: 27 mg/dL — ABNORMAL HIGH (ref 8–23)
CALCIUM: 8.4 mg/dL — AB (ref 8.9–10.3)
CO2: 25 mmol/L (ref 22–32)
CREATININE: 1.19 mg/dL (ref 0.61–1.24)
Chloride: 108 mmol/L (ref 98–111)
GFR calc non Af Amer: >60 mL/min (ref >60)
Glucose, Bld: 173 mg/dL — ABNORMAL HIGH (ref 70–99)
Potassium: 4.4 mmol/L (ref 3.5–5.1)
SODIUM: 140 mmol/L (ref 135–145)

## 2018-01-14 LAB — GLUCOSE, CAPILLARY
GLUCOSE-CAPILLARY: 295 mg/dL — AB (ref 70–99)
Glucose-Capillary: 137 mg/dL — ABNORMAL HIGH (ref 70–99)

## 2018-01-14 LAB — CBC
HCT: 33.6 % — ABNORMAL LOW (ref 40.0–52.0)
Hemoglobin: 11.6 g/dL — ABNORMAL LOW (ref 13.0–18.0)
MCH: 31.4 pg (ref 26.0–34.0)
MCHC: 34.5 g/dL (ref 32.0–36.0)
MCV: 91.2 fL (ref 80.0–100.0)
PLATELETS: 265 10*3/uL (ref 150–440)
RBC: 3.69 MIL/uL — AB (ref 4.40–5.90)
RDW: 13.3 % (ref 11.5–14.5)
WBC: 5.6 10*3/uL (ref 3.8–10.6)

## 2018-01-14 LAB — TROPONIN I
Troponin I: <0.03 ng/mL (ref <0.03)
Troponin I: <0.03 ng/mL (ref <0.03)
Troponin I: <0.03 ng/mL (ref <0.03)

## 2018-01-14 MED ORDER — ORPHENADRINE CITRATE ER 100 MG PO TB12: 100.0000 mg | ORAL_TABLET | Freq: Every day | ORAL | Status: AC

## 2018-01-14 MED ORDER — RANEXA 500 MG PO TB12: ORAL_TABLET | ORAL | 0 refills | Status: AC

## 2018-01-14 NOTE — Plan of Care (Signed)
  Problem: Clinical Measurements: Goal: Diagnostic test results will improve Outcome: Progressing   Problem: Activity: Goal: Ability to tolerate increased activity will improve Outcome: Progressing   Problem: Cardiac: Goal: Ability to achieve and maintain adequate cardiovascular perfusion will improve Outcome: Progressing

## 2018-01-14 NOTE — Progress Notes (Signed)
Patient ambulated around nurses station with standby assistance, unsteady on feet and c/o dizziness as well as some slight chest pain. Assisted back to room and in bed, bed alarm on, patient educated to call for assistance.

## 2018-01-14 NOTE — Progress Notes (Signed)
IV and tele removed from patient. Discharge instructions given to patient. Verbalized understanding. No distress at this time, family to transport patient home.

## 2018-01-14 NOTE — Progress Notes (Signed)
Notified MD of bs of 121. Pt stated at night time usually takes 70 units of lantus at night. Orders placed. Will continue to monitor and assess.

## 2018-01-14 NOTE — Discharge Summary (Signed)
[place] [facility] [place] NAME: Todd Banks    MR#:  [id number]  DATE OF BIRTH:  Feb 24, 1951  DATE OF ADMISSION:  01/13/2018 ADMITTING PHYSICIAN: Epifanio Lesches, MD  DATE OF DISCHARGE: 01/14/2018 12:39 PM  PRIMARY CARE PHYSICIAN: [place] Pink, MD    ADMISSION DIAGNOSIS:  Chest pain, unspecified type [R07.9]  DISCHARGE DIAGNOSIS:  Active Problems:   Chest pain   SECONDARY DIAGNOSIS:   Past Medical History:  Diagnosis Date  . Anemia   . Anxiety   . Chronic back pain   . Constipation   . Coronary artery disease   . DDD (degenerative disc disease), lumbar   . Depression   . Diabetes mellitus without complication ([place])   . Diabetes mellitus, type II ([place])   . Dizziness   . Heart disease   . Hypertension   . Intermittent self-catheterization of bladder   . Kidney stones   . PUD (peptic ulcer disease)   . Shingles     HOSPITAL COURSE:   1.  Chest pain with history of coronary artery disease.  The patient's troponin was negative x3.  Case discussed with Dr. Chancy Milroy cardiology covering for Dr. Lorinda Creed.  He will will follow up with the patient tomorrow in the office.  He advised increasing the patient's Ranexa 1000 mg twice daily.  Patient on aspirin, metoprolol, lisinopril, Imdur, pravastatin and Effient. 2.  Depression and anxiety on Lexapro and Wellbutrin 3.  Type 2 diabetes mellitus on metformin and Lantus 4.  Hyperlipidemia unspecified on pravastatin 5.  essential hypertension continue his usual medications  DISCHARGE CONDITIONS:  Satisfactory    CONSULTS OBTAINED:  Treatment Team:  Isaias Cowman, MD Corey Skains, MD Dionisio David, MD  DRUG ALLERGIES:   Allergies  Allergen Reactions  . Cephalexin Hives    Other reaction(s): Unknown Other reaction(s): Unknown  . Cephalosporins Swelling    Other Reaction: reacts to Keflex  . Penicillins Swelling    Childhood allergy Has patient had a PCN reaction  causing immediate rash, facial/tongue/throat swelling, SOB or lightheadedness with hypotension: Unknown Has patient had a PCN reaction causing severe rash involving mucus membranes or skin necrosis: Unknown Has patient had a PCN reaction that required hospitalization: Unknown Has patient had a PCN reaction occurring within the last 10 years: No If all of the above answers are "NO", then may proceed with Cephalosporin use.   . Ampicillin Swelling    Other reaction(s): Unknown  . Cefazolin Swelling    Other reaction(s): Other (qualifier value)  . Cefuroxime Axetil Swelling  . Ciprofloxacin Swelling    Other reaction(s): Respiratory distress (finding), Weal  . Clindamycin Swelling  . Doxycycline Swelling    Other reaction(s): Unknown  . Erythromycin Swelling    Other reaction(s): Unknown  . Latex Other (See Comments)    Other reaction(s): BLISTERS  . Levofloxacin Swelling    Other reaction(s): Altered mental status (finding), Itching of Skin  . Other Swelling    All Mycins..  . Pork (Porcine) Protein     Other reaction(s): Other (qualifier value)  . Prevacid [Lansoprazole]     Other reaction(s): Unknown Other reaction(s): Unknown    DISCHARGE MEDICATIONS:   Allergies as of 01/14/2018      Reactions   Cephalexin Hives   Other reaction(s): Unknown Other reaction(s): Unknown   Cephalosporins Swelling   Other Reaction: reacts to Keflex   Penicillins Swelling   Childhood allergy Has patient had a PCN reaction causing immediate rash,  facial/tongue/throat swelling, SOB or lightheadedness with hypotension: Unknown Has patient had a PCN reaction causing severe rash involving mucus membranes or skin necrosis: Unknown Has patient had a PCN reaction that required hospitalization: Unknown Has patient had a PCN reaction occurring within the last 10 years: No If all of the above answers are "NO", then may proceed with Cephalosporin use.   Ampicillin Swelling   Other reaction(s): Unknown    Cefazolin Swelling   Other reaction(s): Other (qualifier value)   Cefuroxime Axetil Swelling   Ciprofloxacin Swelling   Other reaction(s): Respiratory distress (finding), Weal   Clindamycin Swelling   Doxycycline Swelling   Other reaction(s): Unknown   Erythromycin Swelling   Other reaction(s): Unknown   Latex Other (See Comments)   Other reaction(s): BLISTERS   Levofloxacin Swelling   Other reaction(s): Altered mental status (finding), Itching of Skin   Other Swelling   All Mycins.Marland Kitchen   Pork (porcine) Protein    Other reaction(s): Other (qualifier value)   Prevacid [lansoprazole]    Other reaction(s): Unknown Other reaction(s): Unknown      Medication List    STOP taking these medications   clomiPHENE 50 MG tablet Commonly known as:  CLOMID     TAKE these medications   aspirin 325 MG tablet Take 325 mg by mouth daily.   BD SAFETYGLIDE SHIELDED NEEDLE 25G X 1" Misc Generic drug:  NEEDLE (DISP) 25 G Use 1 each monthly. for B12 injections   buPROPion 300 MG 24 hr tablet Commonly known as:  WELLBUTRIN XL Take 1 tablet (300 mg total) by mouth daily. To be combined with 150 mg   buPROPion 150 MG 24 hr tablet Commonly known as:  WELLBUTRIN XL Take 1 tablet (150 mg total) by mouth daily. To be taken along with 300 mg   cyanocobalamin 1000 MCG/ML injection Commonly known as:  (VITAMIN B-12) Inject 1,000 mcg into the muscle every 30 (thirty) days.   docusate sodium 50 MG capsule Commonly known as:  COLACE Take 100 mg by mouth daily.   escitalopram 20 MG tablet Commonly known as:  LEXAPRO Take 1 tablet (20 mg total) by mouth daily.   EXEL COMFORT POINT PEN NEEDLE 29G X 12MM Misc Generic drug:  Insulin Pen Needle Use one four times daily.   gabapentin 300 MG capsule Commonly known as:  NEURONTIN Take 300 mg by mouth at bedtime.   gemfibrozil 600 MG tablet Commonly known as:  LOPID Take 600 mg by mouth 2 (two) times daily before a meal.   HUMALOG KWIKPEN 100  UNIT/ML KiwkPen Generic drug:  insulin lispro Inject 15 Units into the skin 3 (three) times daily.   hydrOXYzine 10 MG tablet Commonly known as:  ATARAX/VISTARIL TAKE 1-2 TABLETS (10-20 MG TOTAL) BY MOUTH 2 (TWO) TIMES DAILY AS NEEDED FOR ANXIETY.   isosorbide mononitrate 30 MG 24 hr tablet Commonly known as:  IMDUR Take 30 mg by mouth daily.   LANTUS SOLOSTAR 100 UNIT/ML Solostar Pen Generic drug:  Insulin Glargine INJECT SUBCUTANEOUSLY 70  UNITS NIGHTLY   lisinopril-hydrochlorothiazide 20-25 MG tablet Commonly known as:  PRINZIDE,ZESTORETIC Take 1 tablet by mouth daily.   MEDSAVER SYRINGE 3CC/23GX1" 23G X 1" 3 ML Misc Generic drug:  SYRINGE-NEEDLE (DISP) 3 ML Use as directed with  cyanocobalamin   metFORMIN 1000 MG tablet Commonly known as:  GLUCOPHAGE Take 1,000 mg by mouth 2 (two) times daily with a meal.   metoprolol succinate 25 MG 24 hr tablet Commonly known as:  TOPROL-XL Take 25  mg by mouth daily.   nitroGLYCERIN 0.4 MG SL tablet Commonly known as:  NITROSTAT Place 0.4 mg under the tongue every 5 (five) minutes as needed for chest pain.   omeprazole 40 MG capsule Commonly known as:  PRILOSEC Take 40 mg by mouth daily.   ONE TOUCH ULTRA MINI w/Device Kit   ONE TOUCH ULTRA TEST test strip Generic drug:  glucose blood Use as instructed 5 times daily.   orphenadrine 100 MG tablet Commonly known as:  NORFLEX Take 1 tablet (100 mg total) by mouth at bedtime.   prasugrel 10 MG Tabs tablet Commonly known as:  EFFIENT Take 10 mg by mouth daily.   pravastatin 40 MG tablet Commonly known as:  PRAVACHOL Take 40 mg by mouth daily.   RANEXA 500 MG 12 hr tablet Generic drug:  ranolazine Two tablets twice a day What changed:    how much to take  how to take this  when to take this  additional instructions   sucralfate 1 g tablet Commonly known as:  CARAFATE TAKE 1 TABLET (1 G TOTAL) BY MOUTH 4 (FOUR) TIMES DAILY BEFORE MEALS AND NIGHTLY   traZODone  150 MG tablet Commonly known as:  DESYREL Take 1 tablet (150 mg total) by mouth at bedtime.        DISCHARGE INSTRUCTIONS:   Follow-up with Dr. Chancy Milroy cardiology tomorrow Follow-up with PMD 6 days  If you experience worsening of your admission symptoms, develop shortness of breath, life threatening emergency, suicidal or homicidal thoughts you must seek medical attention immediately by calling 911 or calling your MD immediately  if symptoms less severe.  You Must read complete instructions/literature along with all the possible adverse reactions/side effects for all the Medicines you take and that have been prescribed to you. Take any new Medicines after you have completely understood and accept all the possible adverse reactions/side effects.   Please note  You were cared for by a hospitalist during your hospital stay. If you have any questions about your discharge medications or the care you received while you were in the hospital after you are discharged, you can call the unit and asked to speak with the hospitalist on call if the hospitalist that took care of you is not available. Once you are discharged, your primary care physician will handle any further medical issues. Please note that NO REFILLS for any discharge medications will be authorized once you are discharged, as it is imperative that you return to your primary care physician (or establish a relationship with a primary care physician if you do not have one) for your aftercare needs so that they can reassess your need for medications and monitor your lab values.    Today   CHIEF COMPLAINT:   Chief Complaint  Patient presents with  . Chest Pain    HISTORY OF PRESENT ILLNESS:  Todd Banks  is a 67 y.o. male came in with chest pain   VITAL SIGNS:  Blood pressure 113/69, pulse 68, temperature 98.5 F (36.9 C), temperature source Oral, resp. rate 16, height '5\' 8"'$  (1.727 m), weight 88.6 kg (195 lb 6.4 oz), SpO2 94  %.   PHYSICAL EXAMINATION:  GENERAL:  67 y.o.-year-old patient lying in the bed with no acute distress.  EYES: Pupils equal, round, reactive to light and accommodation. No scleral icterus. Extraocular muscles intact.  HEENT: Head atraumatic, normocephalic. Oropharynx and nasopharynx clear.  NECK:  Supple, no jugular venous distention. No thyroid enlargement, no tenderness.  LUNGS:  Normal breath sounds bilaterally, no wheezing, rales,rhonchi or crepitation. No use of accessory muscles of respiration.  CARDIOVASCULAR: S1, S2 normal. No murmurs, rubs, or gallops.  ABDOMEN: Soft, non-tender, non-distended. Bowel sounds present. No organomegaly or mass.  EXTREMITIES: No pedal edema, cyanosis, or clubbing.  NEUROLOGIC: Cranial nerves II through XII are intact. Muscle strength 5/5 in all extremities. Sensation intact. Gait not checked.  PSYCHIATRIC: The patient is alert and oriented x 3.  SKIN: No obvious rash, lesion, or ulcer.   DATA REVIEW:   CBC Recent Labs  Lab 01/14/18 0437  WBC 5.6  HGB 11.6*  HCT 33.6*  PLT 265    Chemistries  Recent Labs  Lab 01/14/18 0437  NA 140  K 4.4  CL 108  CO2 25  GLUCOSE 173*  BUN 27*  CREATININE 1.19  CALCIUM 8.4*    Cardiac Enzymes Recent Labs  Lab 01/14/18 1053  TROPONINI <0.03      RADIOLOGY:  Dg Chest 2 View  Result Date: 01/13/2018 CLINICAL DATA:  Left-sided chest pain EXAM: CHEST - 2 VIEW COMPARISON:  07/02/2011 FINDINGS: The heart size and mediastinal contours are within normal limits. Both lungs are clear. The visualized skeletal structures are unremarkable. IMPRESSION: No active cardiopulmonary disease. Electronically Signed   By: Kathreen Devoid   On: 01/13/2018 16:57      Management plans discussed with the patient,  and he is in agreement.  CODE STATUS:     Code Status Orders  (From admission, onward)        Start     Ordered   01/13/18 2041  Full code  Continuous     01/13/18 2042    Code Status History     This patient has a current code status but no historical code status.      TOTAL TIME TAKING CARE OF THIS PATIENT: 35 minutes.    Loletha Grayer M.D on 01/14/2018 at 12:47 PM  Between 7am to 6pm - Pager - [phone]  After 6pm go to [email or web address] - password [email or web address] [facility]  [phone]  CC: Primary care physician; [place] Pink, MD

## 2018-01-14 NOTE — Consult Note (Signed)
JULIEN OSCAR is a 67 y.o. male  [id number]  Primary Cardiologist:Dr. Chilton Greathouse Reason for Consultation: chest pain  HPI: this is a 67 year old white male with a past medical history of coronary artery disease status post PCI and stenting presented to the hospital with chest pain and ruled out for myocardial infarction.   Review of Systems: patient denies any shortness of breath orthopnea PND or leg swelling   Past Medical History:  Diagnosis Date  . Anemia   . Anxiety   . Chronic back pain   . Constipation   . Coronary artery disease   . DDD (degenerative disc disease), lumbar   . Depression   . Diabetes mellitus without complication ([place])   . Diabetes mellitus, type II ([place])   . Dizziness   . Heart disease   . Hypertension   . Intermittent self-catheterization of bladder   . Kidney stones   . PUD (peptic ulcer disease)   . Shingles     Medications Prior to Admission  Medication Sig Dispense Refill  . aspirin 325 MG tablet Take 325 mg by mouth daily.    . Blood Glucose Monitoring Suppl (ONE TOUCH ULTRA MINI) w/Device KIT     . buPROPion (WELLBUTRIN XL) 150 MG 24 hr tablet Take 1 tablet (150 mg total) by mouth daily. To be taken along with 300 mg 90 tablet 1  . buPROPion (WELLBUTRIN XL) 300 MG 24 hr tablet Take 1 tablet (300 mg total) by mouth daily. To be combined with 150 mg 90 tablet 1  . cyanocobalamin (,VITAMIN B-12,) 1000 MCG/ML injection Inject 1,000 mcg into the muscle every 30 (thirty) days.     Marland Kitchen docusate sodium (COLACE) 50 MG capsule Take 100 mg by mouth daily.     Marland Kitchen escitalopram (LEXAPRO) 20 MG tablet Take 1 tablet (20 mg total) by mouth daily. 90 tablet 1  . gabapentin (NEURONTIN) 300 MG capsule Take 300 mg by mouth at bedtime.     Marland Kitchen gemfibrozil (LOPID) 600 MG tablet Take 600 mg by mouth 2 (two) times daily before a meal.    . glucose blood (ONE TOUCH ULTRA TEST) test strip Use as instructed 5 times daily.    . hydrOXYzine (ATARAX/VISTARIL) 10 MG tablet  TAKE 1-2 TABLETS (10-20 MG TOTAL) BY MOUTH 2 (TWO) TIMES DAILY AS NEEDED FOR ANXIETY. 120 tablet 1  . Insulin Glargine (LANTUS SOLOSTAR) 100 UNIT/ML Solostar Pen INJECT SUBCUTANEOUSLY 70  UNITS NIGHTLY    . insulin lispro (HUMALOG KWIKPEN) 100 UNIT/ML KiwkPen Inject 15 Units into the skin 3 (three) times daily.     . Insulin Pen Needle (EXEL COMFORT POINT PEN NEEDLE) 29G X 12MM MISC Use one four times daily.    . isosorbide mononitrate (IMDUR) 30 MG 24 hr tablet Take 30 mg by mouth daily.     Marland Kitchen lisinopril-hydrochlorothiazide (PRINZIDE,ZESTORETIC) 20-25 MG tablet Take 1 tablet by mouth daily.     . metFORMIN (GLUCOPHAGE) 1000 MG tablet Take 1,000 mg by mouth 2 (two) times daily with a meal.    . metoprolol succinate (TOPROL-XL) 25 MG 24 hr tablet Take 25 mg by mouth daily.    Marland Kitchen NEEDLE, DISP, 25 G (BD SAFETYGLIDE SHIELDED NEEDLE) 25G X 1" MISC Use 1 each monthly. for B12 injections    . nitroGLYCERIN (NITROSTAT) 0.4 MG SL tablet Place 0.4 mg under the tongue every 5 (five) minutes as needed for chest pain.     Marland Kitchen omeprazole (PRILOSEC) 40 MG capsule Take 40 mg  by mouth daily.     . prasugrel (EFFIENT) 10 MG TABS tablet Take 10 mg by mouth daily.    . pravastatin (PRAVACHOL) 40 MG tablet Take 40 mg by mouth daily.    . sucralfate (CARAFATE) 1 g tablet TAKE 1 TABLET (1 G TOTAL) BY MOUTH 4 (FOUR) TIMES DAILY BEFORE MEALS AND NIGHTLY  11  . SYRINGE-NEEDLE, DISP, 3 ML (MEDSAVER SYRINGE 3CC/23GX1") 23G X 1" 3 ML MISC Use as directed with  cyanocobalamin    . traZODone (DESYREL) 150 MG tablet Take 1 tablet (150 mg total) by mouth at bedtime. 90 tablet 1  . [DISCONTINUED] orphenadrine (NORFLEX) 100 MG tablet Limit  1 tab po / day or bid  If tolerated (Patient taking differently: Take 100 mg by mouth at bedtime. ) 60 tablet 2  . [DISCONTINUED] RANEXA 500 MG 12 hr tablet Take 500 mg by mouth 2 (two) times daily.  0  . clomiPHENE (CLOMID) 50 MG tablet 1/2 tab daily (Patient not taking: Reported on 01/13/2018) 30  tablet 0     . aspirin EC  325 mg Oral Daily  . buPROPion  150 mg Oral Daily  . buPROPion  300 mg Oral Daily  . [START ON 01/25/2018] cyanocobalamin  1,000 mcg Intramuscular Q30 days  . docusate sodium  100 mg Oral BID  . escitalopram  20 mg Oral Daily  . gabapentin  300 mg Oral QHS  . gemfibrozil  600 mg Oral BID AC  . lisinopril  20 mg Oral Daily   And  . hydrochlorothiazide  25 mg Oral Daily  . insulin aspart  0-5 Units Subcutaneous QHS  . insulin aspart  0-9 Units Subcutaneous TID WC  . metFORMIN  1,000 mg Oral BID WC  . metoprolol succinate  25 mg Oral Daily  . orphenadrine  100 mg Oral QHS  . prasugrel  10 mg Oral Daily  . pravastatin  40 mg Oral Daily  . ranolazine  500 mg Oral BID  . sucralfate  1 g Oral TID WC & HS  . traZODone  150 mg Oral QHS    Infusions:   Allergies  Allergen Reactions  . Cephalexin Hives    Other reaction(s): Unknown Other reaction(s): Unknown  . Cephalosporins Swelling    Other Reaction: reacts to Keflex  . Penicillins Swelling    Childhood allergy Has patient had a PCN reaction causing immediate rash, facial/tongue/throat swelling, SOB or lightheadedness with hypotension: Unknown Has patient had a PCN reaction causing severe rash involving mucus membranes or skin necrosis: Unknown Has patient had a PCN reaction that required hospitalization: Unknown Has patient had a PCN reaction occurring within the last 10 years: No If all of the above answers are "NO", then may proceed with Cephalosporin use.   . Ampicillin Swelling    Other reaction(s): Unknown  . Cefazolin Swelling    Other reaction(s): Other (qualifier value)  . Cefuroxime Axetil Swelling  . Ciprofloxacin Swelling    Other reaction(s): Respiratory distress (finding), Weal  . Clindamycin Swelling  . Doxycycline Swelling    Other reaction(s): Unknown  . Erythromycin Swelling    Other reaction(s): Unknown  . Latex Other (See Comments)    Other reaction(s): BLISTERS  .  Levofloxacin Swelling    Other reaction(s): Altered mental status (finding), Itching of Skin  . Other Swelling    All Mycins..  . Pork (Porcine) Protein     Other reaction(s): Other (qualifier value)  . Prevacid [Lansoprazole]     Other  reaction(s): Unknown Other reaction(s): Unknown    Social History   Socioeconomic History  . Marital status: Married    Spouse name: lynette  . Number of children: 4  . Years of education: Not on file  . Highest education level: Some college, no degree  Occupational History    Comment: disabled  Social Needs  . Financial resource strain: Not hard at all  . Food insecurity:    Worry: Never true    Inability: Never true  . Transportation needs:    Medical: No    Non-medical: No  Tobacco Use  . Smoking status: Former Smoker    Last attempt to quit: 07/22/1993    Years since quitting: 24.4  . Smokeless tobacco: Never Used  Substance and Sexual Activity  . Alcohol use: No    Alcohol/week: 0.0 oz  . Drug use: No  . Sexual activity: Not Currently  Lifestyle  . Physical activity:    Days per week: 0 days    Minutes per session: 0 min  . Stress: Not on file  Relationships  . Social connections:    Talks on phone: Never    Gets together: More than three times a week    Attends religious service: More than 4 times per year    Active member of club or organization: No    Attends meetings of clubs or organizations: Never    Relationship status: Married  . Intimate partner violence:    Fear of current or ex partner: No    Emotionally abused: No    Physically abused: No    Forced sexual activity: No  Other Topics Concern  . Not on file  Social History Narrative  . Not on file    Family History  Problem Relation Age of Onset  . Diabetes Mother   . Heart disease Mother   . Hypertension Mother   . Depression Father   . Heart disease Father   . Hypertension Father   . Prostate cancer Neg Hx   . Bladder Cancer Neg Hx   . Kidney  cancer Neg Hx     PHYSICAL EXAM: Vitals:   01/14/18 0336 01/14/18 0810  BP: 123/63 113/69  Pulse: 64 68  Resp: 18 16  Temp: 98.4 F (36.9 C) 98.5 F (36.9 C)  SpO2: 96% 94%     Intake/Output Summary (Last 24 hours) at 01/14/2018 1136 Last data filed at 01/14/2018 0739 Gross per 24 hour  Intake -  Output 725 ml  Net -725 ml    General:  Well appearing. No respiratory difficulty HEENT: normal Neck: supple. no JVD. Carotids 2+ bilat; no bruits. No lymphadenopathy or thryomegaly appreciated. Cor: PMI nondisplaced. Regular rate & rhythm. No rubs, gallops or murmurs. Lungs: clear Abdomen: soft, nontender, nondistended. No hepatosplenomegaly. No bruits or masses. Good bowel sounds. Extremities: no cyanosis, clubbing, rash, edema Neuro: alert & oriented x 3, cranial nerves grossly intact. moves all 4 extremities w/o difficulty. Affect pleasant.  ECG: normal sinus rhythm no acute changes  Results for orders placed or performed during the hospital encounter of 01/13/18 (from the past 24 hour(s))  Basic metabolic panel     Status: Abnormal   Collection Time: 01/13/18  4:39 PM  Result Value Ref Range   Sodium 138 135 - 145 mmol/L   Potassium 4.5 3.5 - 5.1 mmol/L   Chloride 106 98 - 111 mmol/L   CO2 25 22 - 32 mmol/L   Glucose, Bld 230 (H) 70 - 99  mg/dL   BUN 28 (H) 8 - 23 mg/dL   Creatinine, Ser 1.38 (H) 0.61 - 1.24 mg/dL   Calcium 8.8 (L) 8.9 - 10.3 mg/dL   GFR calc non Af Amer 52 (L) >60 mL/min   GFR calc Af Amer 60 (L) >60 mL/min   Anion gap 7 5 - 15  CBC     Status: Abnormal   Collection Time: 01/13/18  4:39 PM  Result Value Ref Range   WBC 5.8 3.8 - 10.6 K/uL   RBC 4.04 (L) 4.40 - 5.90 MIL/uL   Hemoglobin 12.7 (L) 13.0 - 18.0 g/dL   HCT 36.9 (L) 40.0 - 52.0 %   MCV 91.3 80.0 - 100.0 fL   MCH 31.4 26.0 - 34.0 pg   MCHC 34.3 32.0 - 36.0 g/dL   RDW 13.1 11.5 - 14.5 %   Platelets 287 150 - 440 K/uL  Troponin I     Status: None   Collection Time: 01/13/18  4:39 PM   Result Value Ref Range   Troponin I <0.03 <0.03 ng/mL  Glucose, capillary     Status: Abnormal   Collection Time: 01/13/18 10:12 PM  Result Value Ref Range   Glucose-Capillary 121 (H) 70 - 99 mg/dL   Comment 1 Notify RN    Comment 2 Document in Chart   Troponin I     Status: None   Collection Time: 01/13/18 11:20 PM  Result Value Ref Range   Troponin I <0.03 <0.03 ng/mL  Basic metabolic panel     Status: Abnormal   Collection Time: 01/14/18  4:37 AM  Result Value Ref Range   Sodium 140 135 - 145 mmol/L   Potassium 4.4 3.5 - 5.1 mmol/L   Chloride 108 98 - 111 mmol/L   CO2 25 22 - 32 mmol/L   Glucose, Bld 173 (H) 70 - 99 mg/dL   BUN 27 (H) 8 - 23 mg/dL   Creatinine, Ser 1.19 0.61 - 1.24 mg/dL   Calcium 8.4 (L) 8.9 - 10.3 mg/dL   GFR calc non Af Amer >60 >60 mL/min   GFR calc Af Amer >60 >60 mL/min   Anion gap 7 5 - 15  CBC     Status: Abnormal   Collection Time: 01/14/18  4:37 AM  Result Value Ref Range   WBC 5.6 3.8 - 10.6 K/uL   RBC 3.69 (L) 4.40 - 5.90 MIL/uL   Hemoglobin 11.6 (L) 13.0 - 18.0 g/dL   HCT 33.6 (L) 40.0 - 52.0 %   MCV 91.2 80.0 - 100.0 fL   MCH 31.4 26.0 - 34.0 pg   MCHC 34.5 32.0 - 36.0 g/dL   RDW 13.3 11.5 - 14.5 %   Platelets 265 150 - 440 K/uL  Troponin I     Status: None   Collection Time: 01/14/18  4:37 AM  Result Value Ref Range   Troponin I <0.03 <0.03 ng/mL  Glucose, capillary     Status: Abnormal   Collection Time: 01/14/18  8:09 AM  Result Value Ref Range   Glucose-Capillary 137 (H) 70 - 99 mg/dL  Troponin I     Status: None   Collection Time: 01/14/18 10:53 AM  Result Value Ref Range   Troponin I <0.03 <0.03 ng/mL   Dg Chest 2 View  Result Date: 01/13/2018 CLINICAL DATA:  Left-sided chest pain EXAM: CHEST - 2 VIEW COMPARISON:  07/02/2011 FINDINGS: The heart size and mediastinal contours are within normal limits. Both lungs are clear. The  visualized skeletal structures are unremarkable. IMPRESSION: No active cardiopulmonary disease.  Electronically Signed   By: Kathreen Devoid   On: 01/13/2018 16:57     ASSESSMENT AND PLAN: anginal type chest pain with history of coronary artery disease on cardiac catheterization done 2017 patient had occluded diagonal and occluded small nondominant right coronary with distal LAD occluded. He has a stent in the left circumflex which had no significant disease.he had 50% proximal circumflex lesion and 40% and OM there was decided to treat him medically since his left ventricular ejection fraction was normal. Patient todayis feeling much better and had chest pain last night lasting few minutes. He has ruled out for myocardial infarction. Advise increasing Ranexa from [id number] mg by mouth twice a day and add isosorbide 30 mg once a day. We will see him tomorrow in the office make sure he is doing okay.  KHAN,SHAUKAT A

## 2018-01-16 LAB — HIV ANTIBODY (ROUTINE TESTING W REFLEX): HIV SCREEN 4TH GENERATION: NONREACTIVE

## 2018-02-04 ENCOUNTER — Ambulatory Visit (INDEPENDENT_AMBULATORY_CARE_PROVIDER_SITE_OTHER): Payer: 59 | Admitting: Licensed Clinical Social Worker

## 2018-02-04 DIAGNOSIS — F331 Major depressive disorder, recurrent, moderate: Secondary | ICD-10-CM

## 2018-03-16 ENCOUNTER — Encounter: Payer: Self-pay | Admitting: Psychiatry

## 2018-03-16 ENCOUNTER — Ambulatory Visit (INDEPENDENT_AMBULATORY_CARE_PROVIDER_SITE_OTHER): Payer: 59 | Admitting: Psychiatry

## 2018-03-16 ENCOUNTER — Other Ambulatory Visit: Payer: Self-pay

## 2018-03-16 VITALS — BP 116/68 | HR 78 | Temp 98.5°F | Wt 200.0 lb

## 2018-03-16 DIAGNOSIS — F411 Generalized anxiety disorder: Secondary | ICD-10-CM

## 2018-03-16 DIAGNOSIS — R413 Other amnesia: Secondary | ICD-10-CM | POA: Diagnosis not present

## 2018-03-16 DIAGNOSIS — F331 Major depressive disorder, recurrent, moderate: Secondary | ICD-10-CM

## 2018-03-16 MED ORDER — SERTRALINE HCL 50 MG PO TABS: 25.0000 mg | ORAL_TABLET | Freq: Every day | ORAL | 0 refills | Status: DC

## 2018-03-16 MED ORDER — TRAZODONE HCL 150 MG PO TABS
150.0000 mg | ORAL_TABLET | Freq: Every day | ORAL | 1 refills | Status: DC
Start: 2018-03-16 — End: 2018-09-21

## 2018-03-16 MED ORDER — BUPROPION HCL ER (XL) 300 MG PO TB24: 300.0000 mg | ORAL_TABLET | Freq: Every day | ORAL | 1 refills | Status: DC

## 2018-03-16 MED ORDER — BUPROPION HCL ER (XL) 150 MG PO TB24: 150.0000 mg | ORAL_TABLET | Freq: Every day | ORAL | 1 refills | Status: DC

## 2018-03-16 NOTE — Progress Notes (Signed)
[place] MD  OP Progress Note  03/16/2018 5:51 PM Todd Banks  MRN:  [id number]  Chief Complaint: ' I am here for follow up." Chief Complaint    Follow-up; Medication Refill     HPI: Todd Banks is a 67 year old Caucasian male, married, on SSI, lives in [place], has a history of depression, chronic pain, presented to the clinic today for a follow-up visit.  Patient reports he continues to struggle with depressive symptoms.  He does not think his medications are effective.  He reports lack of motivation, sadness and anhedonia.  He continues to have chronic pain which radiates down to his legs.  Patient reports his physical limitation from his pain also is contributing to his depressive symptoms.  Patient reports sleep as okay on the trazodone.  Discussed frequent psychotherapy visits with his therapist.  Patient agrees with plan.  Also discussed medication changes to address his depressive symptoms today.  Patient agrees with being started on a medication called Zoloft.  Will start Zoloft 25 mg and increase it to 50 mg in 1-2 weeks.  Patient denies any suicidality.  Patient denies any homicidality.  Patient denies any perceptual disturbances. Visit Diagnosis:    ICD-10-CM   1. MDD (major depressive disorder), recurrent episode, moderate (HCC) F33.1 buPROPion (WELLBUTRIN XL) 150 MG 24 hr tablet    buPROPion (WELLBUTRIN XL) 300 MG 24 hr tablet    traZODone (DESYREL) 150 MG tablet    sertraline (ZOLOFT) 50 MG tablet  2. GAD (generalized anxiety disorder) F41.1   3. Memory problem R41.3     Past Psychiatric History: I have reviewed past psychiatric history from my progress note on 10/27/2017.  Past Medical History:  Past Medical History:  Diagnosis Date  . Anemia   . Anxiety   . Chronic back pain   . Constipation   . Coronary artery disease   . DDD (degenerative disc disease), lumbar   . Depression   . Diabetes mellitus without complication ([place])   . Diabetes mellitus, type II ([place])    . Dizziness   . Heart disease   . Hypertension   . Intermittent self-catheterization of bladder   . Kidney stones   . PUD (peptic ulcer disease)   . Shingles     Past Surgical History:  Procedure Laterality Date  . BACK SURGERY    . CARDIAC CATHETERIZATION N/A 08/28/2015   Procedure: Left Heart Cath and Coronary Angiography;  Surgeon: Isaias Cowman, MD;  Location: [facility];  Service: Cardiovascular;  Laterality: N/A;  . CHOLECYSTECTOMY    . COLONOSCOPY WITH PROPOFOL N/A 08/07/2017   Procedure: COLONOSCOPY WITH PROPOFOL;  Surgeon: Manya Silvas, MD;  Location[facility] ENDOSCOPY;  Service: Endoscopy;  Laterality: N/A;  . CORONARY ANGIOPLASTY    . CORONARY STENT PLACEMENT    . ESOPHAGOGASTRODUODENOSCOPY (EGD) WITH PROPOFOL N/A 09/29/2016   Procedure: ESOPHAGOGASTRODUODENOSCOPY (EGD) WITH PROPOFOL;  Surgeon: Manya Silvas, MD;  Location: [facility] ENDOSCOPY;  Service: Endoscopy;  Laterality: N/A;  . ESOPHAGOGASTRODUODENOSCOPY (EGD) WITH PROPOFOL N/A 08/07/2017   Procedure: ESOPHAGOGASTRODUODENOSCOPY (EGD) WITH PROPOFOL;  Surgeon: Manya Silvas, MD;  Location: [facility] ENDOSCOPY;  Service: Endoscopy;  Laterality: N/A;  . HAND SURGERY Right     Family Psychiatric History: Reviewed family psychiatric history from my progress note on 10/27/2017. Family History:  Family History  Problem Relation Age of Onset  . Diabetes Mother   . Heart disease Mother   . Hypertension Mother   . Depression Father   . Heart disease  Father   . Hypertension Father   . Prostate cancer Neg Hx   . Bladder Cancer Neg Hx   . Kidney cancer Neg Hx     Social History: Reviewed social history from my progress note on 10/27/2017. Social History   Socioeconomic History  . Marital status: Married    Spouse name: Todd Banks  . Number of children: 4  . Years of education: Not on file  . Highest education level: Some college, no degree  Occupational History    Comment: disabled  Social Needs  .  Financial resource strain: Not hard at all  . Food insecurity:    Worry: Never true    Inability: Never true  . Transportation needs:    Medical: No    Non-medical: No  Tobacco Use  . Smoking status: Former Smoker    Last attempt to quit: 07/22/1993    Years since quitting: 24.6  . Smokeless tobacco: Never Used  Substance and Sexual Activity  . Alcohol use: No    Alcohol/week: 0.0 standard drinks  . Drug use: No  . Sexual activity: Not Currently  Lifestyle  . Physical activity:    Days per week: 0 days    Minutes per session: 0 min  . Stress: Not on file  Relationships  . Social connections:    Talks on phone: Never    Gets together: More than three times a week    Attends religious service: More than 4 times per year    Active member of club or organization: No    Attends meetings of clubs or organizations: Never    Relationship status: Married  Other Topics Concern  . Not on file  Social History Narrative  . Not on file    Allergies:  Allergies  Allergen Reactions  . Cephalexin Hives    Other reaction(s): Unknown Other reaction(s): Unknown  . Cephalosporins Swelling    Other Reaction: reacts to Keflex  . Penicillins Swelling    Childhood allergy Has patient had a PCN reaction causing immediate rash, facial/tongue/throat swelling, SOB or lightheadedness with hypotension: Unknown Has patient had a PCN reaction causing severe rash involving mucus membranes or skin necrosis: Unknown Has patient had a PCN reaction that required hospitalization: Unknown Has patient had a PCN reaction occurring within the last 10 years: No If all of the above answers are "NO", then may proceed with Cephalosporin use.   . Ampicillin Swelling    Other reaction(s): Unknown  . Cefazolin Swelling    Other reaction(s): Other (qualifier value)  . Cefuroxime Axetil Swelling  . Ciprofloxacin Swelling    Other reaction(s): Respiratory distress (finding), Weal  . Clindamycin Swelling  .  Doxycycline Swelling    Other reaction(s): Unknown  . Erythromycin Swelling    Other reaction(s): Unknown  . Latex Other (See Comments)    Other reaction(s): BLISTERS  . Levofloxacin Swelling    Other reaction(s): Altered mental status (finding), Itching of Skin  . Other Swelling    All Mycins..  . Pork (Porcine) Protein     Other reaction(s): Other (qualifier value)  . Prevacid [Lansoprazole]     Other reaction(s): Unknown Other reaction(s): Unknown    Metabolic Disorder Labs: No results found for: HGBA1C, MPG Lab Results  Component Value Date   PROLACTIN 19.0 (H) 03/26/2017   No results found for: CHOL, TRIG, HDL, CHOLHDL, VLDL, LDLCALC Lab Results  Component Value Date   TSH 1.387 01/12/2018    Therapeutic Level Labs: No results found for:  LITHIUM No results found for: VALPROATE No components found for:  CBMZ  Current Medications: Current Outpatient Medications  Medication Sig Dispense Refill  . aspirin 325 MG tablet Take 325 mg by mouth daily.    . Blood Glucose Monitoring Suppl (ONE TOUCH ULTRA MINI) w/Device KIT     . buPROPion (WELLBUTRIN XL) 150 MG 24 hr tablet Take 1 tablet (150 mg total) by mouth daily. To be taken along with 300 mg 90 tablet 1  . buPROPion (WELLBUTRIN XL) 300 MG 24 hr tablet Take 1 tablet (300 mg total) by mouth daily. To be combined with 150 mg 90 tablet 1  . cyanocobalamin (,VITAMIN B-12,) 1000 MCG/ML injection Inject 1,000 mcg into the muscle every 30 (thirty) days.     Marland Kitchen docusate sodium (COLACE) 50 MG capsule Take 100 mg by mouth daily.     Marland Kitchen gabapentin (NEURONTIN) 300 MG capsule Take 300 mg by mouth at bedtime.     Marland Kitchen gemfibrozil (LOPID) 600 MG tablet Take 600 mg by mouth 2 (two) times daily before a meal.    . glucose blood (ONE TOUCH ULTRA TEST) test strip Use as instructed 5 times daily.    . hydrOXYzine (ATARAX/VISTARIL) 10 MG tablet TAKE 1-2 TABLETS (10-20 MG TOTAL) BY MOUTH 2 (TWO) TIMES DAILY AS NEEDED FOR ANXIETY. 120 tablet 1  .  Insulin Glargine (LANTUS SOLOSTAR) 100 UNIT/ML Solostar Pen INJECT SUBCUTANEOUSLY 70  UNITS NIGHTLY    . insulin lispro (HUMALOG KWIKPEN) 100 UNIT/ML KiwkPen Inject 15 Units into the skin 3 (three) times daily.     . Insulin Pen Needle (EXEL COMFORT POINT PEN NEEDLE) 29G X 12MM MISC Use one four times daily.    . isosorbide mononitrate (IMDUR) 30 MG 24 hr tablet Take 30 mg by mouth daily.     Marland Kitchen lisinopril-hydrochlorothiazide (PRINZIDE,ZESTORETIC) 20-25 MG tablet Take 1 tablet by mouth daily.     . metFORMIN (GLUCOPHAGE) 1000 MG tablet Take 1,000 mg by mouth 2 (two) times daily with a meal.    . metoprolol succinate (TOPROL-XL) 25 MG 24 hr tablet Take 25 mg by mouth daily.    Marland Kitchen NEEDLE, DISP, 25 G (BD SAFETYGLIDE SHIELDED NEEDLE) 25G X 1" MISC Use 1 each monthly. for B12 injections    . nitroGLYCERIN (NITROSTAT) 0.4 MG SL tablet Place 0.4 mg under the tongue every 5 (five) minutes as needed for chest pain.     Marland Kitchen omeprazole (PRILOSEC) 40 MG capsule Take 40 mg by mouth daily.     . orphenadrine (NORFLEX) 100 MG tablet Take 1 tablet (100 mg total) by mouth at bedtime.    . prasugrel (EFFIENT) 10 MG TABS tablet Take 10 mg by mouth daily.    . pravastatin (PRAVACHOL) 40 MG tablet Take 40 mg by mouth daily.    Marland Kitchen RANEXA 500 MG 12 hr tablet Two tablets twice a day 120 tablet 0  . sertraline (ZOLOFT) 50 MG tablet Take 0.5-1 tablets (25-50 mg total) by mouth daily. Start taking half tablet for 2 weeks and then start taking 1 tablet 90 tablet 0  . sucralfate (CARAFATE) 1 g tablet TAKE 1 TABLET (1 G TOTAL) BY MOUTH 4 (FOUR) TIMES DAILY BEFORE MEALS AND NIGHTLY  11  . SYRINGE-NEEDLE, DISP, 3 ML (MEDSAVER SYRINGE 3CC/23GX1") 23G X 1" 3 ML MISC Use as directed with  cyanocobalamin    . traZODone (DESYREL) 150 MG tablet Take 1 tablet (150 mg total) by mouth at bedtime. 90 tablet 1   No current  facility-administered medications for this visit.      Musculoskeletal: Strength & Muscle Tone: within normal  limits Gait & Station: normal Patient leans: N/A  Psychiatric Specialty Exam: Review of Systems  Musculoskeletal: Positive for back pain.  Psychiatric/Behavioral: Positive for depression. The patient is nervous/anxious.   All other systems reviewed and are negative.   Blood pressure 116/68, pulse 78, temperature 98.5 F (36.9 C), temperature source Oral, weight 200 lb (90.7 kg).Body mass index is 30.41 kg/m.  General Appearance: Casual  Eye Contact:  Fair  Speech:  Clear and Coherent  Volume:  Normal  Mood:  Anxious and Depressed  Affect:  Congruent  Thought Process:  Goal Directed and Descriptions of Associations: Intact  Orientation:  Full (Time, Place, and Person)  Thought Content: Logical   Suicidal Thoughts:  No  Homicidal Thoughts:  No  Memory:  Immediate;   Fair Recent;   Fair Remote;   Fair  Judgement:  Fair  Insight:  Fair  Psychomotor Activity:  Normal  Concentration:  Concentration: Fair and Attention Span: Fair  Recall:  [employer] of Knowledge: Fair  Language: Fair  Akathisia:  No  Handed:  Right  AIMS (if indicated): na  Assets:  Communication Skills Desire for Improvement Social Support  ADL's:  Intact  Cognition: WNL  Sleep:  Fair   Screenings: PHQ2-9     Office Visit from 03/04/2016 in [place] Procedure visit from 01/21/2016 in [place] Clinical Support from 12/31/2015 in [place] Office Visit from 11/29/2015 in [place] Clinical Support from 10/04/2015 in [place]  PHQ-2 Total Score  0  0  0  0  0       Assessment and Plan: Todd Banks is a 67 year old Caucasian male who has a history of depression, chronic pain, presented to the clinic today for a follow-up visit.  Patient continues to struggle with depressive symptoms which  are also contributed by his comorbid pain.  Will make the following medication readjustment as noted below.  Patient will also start more frequent psychotherapy visits.  Plan Depression Continue Wellbutrin XL 450 mg p.o. daily Discontinue Lexapro for lack of efficacy. Start Zoloft 25 mg for 2 weeks and increase to 50 mg after that. Continue psychotherapy with Ms. Peacock on a more frequent basis. PHQ 9 equals 13  Anxiety symptoms GAD 7 equals 10 Discontinue Lexapro for lack of efficacy. Start Zoloft 25 mg for 2 weeks and increase to 50 mg. Continue hydroxyzine as needed for anxiety attacks.  For insomnia Trazodone 150 mg p.o. Nightly.  Memory problems Patient will continue to monitor his symptoms closely. MMSE done on 09/17/2017-30 out of 30.  Patient completed an Epworth sleep scale and scored 9 in the past.  We will continue to monitor.  Follow-up in clinic in 2-3 weeks or sooner if needed.  More than 50 % of the time was spent for psychoeducation and supportive psychotherapy and care coordination.  This note was generated in part or whole with voice recognition software. Voice recognition is usually quite accurate but there are transcription errors that can and very often do occur. I apologize for any typographical errors that were not detected and corrected.       Ursula Alert, MD 03/16/2018, 5:51 PM

## 2018-03-16 NOTE — Patient Instructions (Signed)
Once you receive your Zoloft or sertraline from [facility] - Stop taking Lexapro and start taking Zoloft. Please take Zoloft in the morning with breakfast. Start taking 25 mg or half tablet for 2 weeks and then start taking 1 tablet or 50 mg.

## 2018-03-23 ENCOUNTER — Other Ambulatory Visit: Payer: Self-pay | Admitting: Psychiatry

## 2018-03-23 DIAGNOSIS — F331 Major depressive disorder, recurrent, moderate: Secondary | ICD-10-CM

## 2018-03-24 NOTE — Progress Notes (Signed)
   THERAPIST PROGRESS NOTE  Session Time: 78min  Participation Level: Active  Behavioral Response: CasualAlertDepressed  Type of Therapy: Individual Therapy  Treatment Goals addressed: Diagnosis: Depression  Interventions: Supportive  Summary: Todd Banks is a 67 y.o. male who presents with continued symptoms of diagnosis.  Patient reported mood as "alright"  Therapist provided psychoeducation on his diagnosis.  Provided Patient with information on how to alleviate symptoms.  Role played coping skills such as: deep breathing and meditation   Suicidal/Homicidal: No  Plan: Return again in 2 weeks.  Diagnosis: Axis I: Depression    Axis II: No diagnosis    Lubertha South, LCSW 02/04/2018

## 2018-03-31 ENCOUNTER — Ambulatory Visit: Payer: Medicare Other | Admitting: Licensed Clinical Social Worker

## 2018-04-13 ENCOUNTER — Encounter: Payer: Self-pay | Admitting: Psychiatry

## 2018-04-13 ENCOUNTER — Ambulatory Visit (INDEPENDENT_AMBULATORY_CARE_PROVIDER_SITE_OTHER): Payer: 59 | Admitting: Psychiatry

## 2018-04-13 ENCOUNTER — Other Ambulatory Visit: Payer: Self-pay

## 2018-04-13 VITALS — BP 134/74 | HR 84 | Temp 97.9°F | Wt 200.0 lb

## 2018-04-13 DIAGNOSIS — F331 Major depressive disorder, recurrent, moderate: Secondary | ICD-10-CM | POA: Diagnosis not present

## 2018-04-13 DIAGNOSIS — F411 Generalized anxiety disorder: Secondary | ICD-10-CM

## 2018-04-13 DIAGNOSIS — R413 Other amnesia: Secondary | ICD-10-CM

## 2018-04-13 NOTE — Progress Notes (Signed)
Todd Banks OP Progress Note  04/13/2018 2:32 PM Todd Banks  MRN:  [id number]  Chief Complaint: ' I am here for follow up." Chief Complaint    Follow-up; Medication Refill     HPI: Todd Banks is a 67 year old Caucasian male, married, on SSI, lives in [place], has a history of depression, chronic pain, presented to the clinic today for a follow-up visit.  Patient today reports that his mood symptoms have been improving.  He reports his sadness and anhedonia as better.  He reports he has been feeling a little bit motivated and has been doing some chores around the house.  He reports he has actually started mowing his yard again which is a change for him.  He reports he is tolerating the increased dose of Zoloft at 50 mg well.  He denies any side effects.  Patient reports sleep is okay on the trazodone.  He continues to struggle with pain but sometimes makes sleep restless.  He continues to work with his pain providers for managing his pain.  Patient denies any suicidality.  Patient denies any homicidality.  Patient denies any perceptual disturbances.  Patient continues to work with Elmyra Ricks in psychotherapy which has been helpful.  Visit Diagnosis:    ICD-10-CM   1. MDD (major depressive disorder), recurrent episode, moderate (HCC) F33.1   2. GAD (generalized anxiety disorder) F41.1   3. Memory problem R41.3     Past Psychiatric History: Reviewed past psychiatric history from my progress note on 10/27/2017  Past Medical History:  Past Medical History:  Diagnosis Date  . Anemia   . Anxiety   . Chronic back pain   . Constipation   . Coronary artery disease   . DDD (degenerative disc disease), lumbar   . Depression   . Diabetes mellitus without complication ([place])   . Diabetes mellitus, type II ([place])   . Dizziness   . Heart disease   . Hypertension   . Intermittent self-catheterization of bladder   . Kidney stones   . PUD (peptic ulcer disease)   . Shingles     Past Surgical  History:  Procedure Laterality Date  . BACK SURGERY    . CARDIAC CATHETERIZATION N/A 08/28/2015   Procedure: Left Heart Cath and Coronary Angiography;  Surgeon: Isaias Cowman, Banks;  Location: [facility];  Service: Cardiovascular;  Laterality: N/A;  . CHOLECYSTECTOMY    . COLONOSCOPY WITH PROPOFOL N/A 08/07/2017   Procedure: COLONOSCOPY WITH PROPOFOL;  Surgeon: Manya Silvas, Banks;  Location: [facility] ENDOSCOPY;  Service: Endoscopy;  Laterality: N/A;  . CORONARY ANGIOPLASTY    . CORONARY STENT PLACEMENT    . ESOPHAGOGASTRODUODENOSCOPY (EGD) WITH PROPOFOL N/A 09/29/2016   Procedure: ESOPHAGOGASTRODUODENOSCOPY (EGD) WITH PROPOFOL;  Surgeon: Manya Silvas, Banks;  Location: [facility] ENDOSCOPY;  Service: Endoscopy;  Laterality: N/A;  . ESOPHAGOGASTRODUODENOSCOPY (EGD) WITH PROPOFOL N/A 08/07/2017   Procedure: ESOPHAGOGASTRODUODENOSCOPY (EGD) WITH PROPOFOL;  Surgeon: Manya Silvas, Banks;  Location: [facility] ENDOSCOPY;  Service: Endoscopy;  Laterality: N/A;  . HAND SURGERY Right     Family Psychiatric History: Reviewed family psychiatric history from my progress note on 10/27/2017  Family History:  Family History  Problem Relation Age of Onset  . Diabetes Mother   . Heart disease Mother   . Hypertension Mother   . Depression Father   . Heart disease Father   . Hypertension Father   . Prostate cancer Neg Hx   . Bladder Cancer Neg Hx   . Kidney cancer Neg  Hx     Social History: Have reviewed social history from my progress note on 10/27/2017 Social History   Socioeconomic History  . Marital status: Married    Spouse name: lynette  . Number of children: 4  . Years of education: Not on file  . Highest education level: Some college, no degree  Occupational History    Comment: disabled  Social Needs  . Financial resource strain: Not hard at all  . Food insecurity:    Worry: Never true    Inability: Never true  . Transportation needs:    Medical: No    Non-medical: No  Tobacco  Use  . Smoking status: Former Smoker    Last attempt to quit: 07/22/1993    Years since quitting: 24.7  . Smokeless tobacco: Never Used  Substance and Sexual Activity  . Alcohol use: No    Alcohol/week: 0.0 standard drinks  . Drug use: No  . Sexual activity: Not Currently  Lifestyle  . Physical activity:    Days per week: 0 days    Minutes per session: 0 min  . Stress: Not on file  Relationships  . Social connections:    Talks on phone: Never    Gets together: More than three times a week    Attends religious service: More than 4 times per year    Active member of club or organization: No    Attends meetings of clubs or organizations: Never    Relationship status: Married  Other Topics Concern  . Not on file  Social History Narrative  . Not on file    Allergies:  Allergies  Allergen Reactions  . Cephalexin Hives    Other reaction(s): Unknown Other reaction(s): Unknown  . Cephalosporins Swelling    Other Reaction: reacts to Keflex  . Penicillins Swelling    Childhood allergy Has patient had a PCN reaction causing immediate rash, facial/tongue/throat swelling, SOB or lightheadedness with hypotension: Unknown Has patient had a PCN reaction causing severe rash involving mucus membranes or skin necrosis: Unknown Has patient had a PCN reaction that required hospitalization: Unknown Has patient had a PCN reaction occurring within the last 10 years: No If all of the above answers are "NO", then may proceed with Cephalosporin use.   . Ampicillin Swelling    Other reaction(s): Unknown  . Cefazolin Swelling    Other reaction(s): Other (qualifier value)  . Cefuroxime Axetil Swelling  . Ciprofloxacin Swelling    Other reaction(s): Respiratory distress (finding), Weal  . Clindamycin Swelling  . Doxycycline Swelling    Other reaction(s): Unknown  . Erythromycin Swelling    Other reaction(s): Unknown  . Latex Other (See Comments)    Other reaction(s): BLISTERS  . Levofloxacin  Swelling    Other reaction(s): Altered mental status (finding), Itching of Skin  . Other Swelling    All Mycins..  . Pork (Porcine) Protein     Other reaction(s): Other (qualifier value)  . Prevacid [Lansoprazole]     Other reaction(s): Unknown Other reaction(s): Unknown    Metabolic Disorder Labs: No results found for: HGBA1C, MPG Lab Results  Component Value Date   PROLACTIN 19.0 (H) 03/26/2017   No results found for: CHOL, TRIG, HDL, CHOLHDL, VLDL, LDLCALC Lab Results  Component Value Date   TSH 1.387 01/12/2018    Therapeutic Level Labs: No results found for: LITHIUM No results found for: VALPROATE No components found for:  CBMZ  Current Medications: Current Outpatient Medications  Medication Sig Dispense Refill  .  aspirin 325 MG tablet Take 325 mg by mouth daily.    . Blood Glucose Monitoring Suppl (ONE TOUCH ULTRA MINI) w/Device KIT     . buPROPion (WELLBUTRIN XL) 150 MG 24 hr tablet Take 1 tablet (150 mg total) by mouth daily. To be taken along with 300 mg 90 tablet 1  . buPROPion (WELLBUTRIN XL) 300 MG 24 hr tablet Take 1 tablet (300 mg total) by mouth daily. To be combined with 150 mg 90 tablet 1  . cyanocobalamin (,VITAMIN B-12,) 1000 MCG/ML injection Inject 1,000 mcg into the muscle every 30 (thirty) days.     Marland Kitchen docusate sodium (COLACE) 50 MG capsule Take 100 mg by mouth daily.     Marland Kitchen gabapentin (NEURONTIN) 300 MG capsule Take 300 mg by mouth at bedtime.     Marland Kitchen gemfibrozil (LOPID) 600 MG tablet Take 600 mg by mouth 2 (two) times daily before a meal.    . glucose blood (ONE TOUCH ULTRA TEST) test strip Use as instructed 5 times daily.    . hydrochlorothiazide (HYDRODIURIL) 25 MG tablet Take by mouth.    . hydrOXYzine (ATARAX/VISTARIL) 10 MG tablet TAKE 1-2 TABLETS (10-20 MG TOTAL) BY MOUTH 2 (TWO) TIMES DAILY AS NEEDED FOR ANXIETY. 120 tablet 1  . Insulin Glargine (LANTUS SOLOSTAR) 100 UNIT/ML Solostar Pen INJECT SUBCUTANEOUSLY 70  UNITS NIGHTLY    . insulin lispro  (HUMALOG KWIKPEN) 100 UNIT/ML KiwkPen Inject 15 Units into the skin 3 (three) times daily.     . Insulin Pen Needle (EXEL COMFORT POINT PEN NEEDLE) 29G X 12MM MISC Use one four times daily.    . isosorbide mononitrate (IMDUR) 30 MG 24 hr tablet Take 30 mg by mouth daily.     Marland Kitchen lisinopril (PRINIVIL,ZESTRIL) 10 MG tablet Take by mouth.    Marland Kitchen lisinopril-hydrochlorothiazide (PRINZIDE,ZESTORETIC) 20-25 MG tablet Take 1 tablet by mouth daily.     . metFORMIN (GLUCOPHAGE) 1000 MG tablet Take 1,000 mg by mouth 2 (two) times daily with a meal.    . metoprolol succinate (TOPROL-XL) 25 MG 24 hr tablet Take 25 mg by mouth daily.    Marland Kitchen NEEDLE, DISP, 25 G (BD SAFETYGLIDE SHIELDED NEEDLE) 25G X 1" MISC Use 1 each monthly. for B12 injections    . nitroGLYCERIN (NITROSTAT) 0.4 MG SL tablet Place 0.4 mg under the tongue every 5 (five) minutes as needed for chest pain.     Marland Kitchen omeprazole (PRILOSEC) 40 MG capsule Take 40 mg by mouth daily.     . orphenadrine (NORFLEX) 100 MG tablet Take 1 tablet (100 mg total) by mouth at bedtime.    . Oxycodone HCl 20 MG TABS LIMIT 1/2 - 1 TABLET BY MOUTH 3 - 5 TIMES PER DAY. NOTE TABLET IS STRONGER  0  . prasugrel (EFFIENT) 10 MG TABS tablet Take 10 mg by mouth daily.    . pravastatin (PRAVACHOL) 40 MG tablet Take 40 mg by mouth daily.    Marland Kitchen RANEXA 500 MG 12 hr tablet Two tablets twice a day 120 tablet 0  . sertraline (ZOLOFT) 50 MG tablet Take 0.5-1 tablets (25-50 mg total) by mouth daily. Start taking half tablet for 2 weeks and then start taking 1 tablet 90 tablet 0  . sucralfate (CARAFATE) 1 g tablet TAKE 1 TABLET (1 G TOTAL) BY MOUTH 4 (FOUR) TIMES DAILY BEFORE MEALS AND NIGHTLY  11  . SYRINGE-NEEDLE, DISP, 3 ML (MEDSAVER SYRINGE 3CC/23GX1") 23G X 1" 3 ML MISC Use as directed with  cyanocobalamin    .  traZODone (DESYREL) 150 MG tablet Take 1 tablet (150 mg total) by mouth at bedtime. 90 tablet 1   No current facility-administered medications for this visit.       Musculoskeletal: Strength & Muscle Tone: wnl Gait & Station: normal Patient leans: N/A  Psychiatric Specialty Exam: Review of Systems  Psychiatric/Behavioral: Positive for depression. The patient is nervous/anxious.   All other systems reviewed and are negative.   Blood pressure 134/74, pulse 84, temperature 97.9 F (36.6 C), temperature source Oral, weight 200 lb (90.7 kg).Body mass index is 30.41 kg/m.  General Appearance: Casual  Eye Contact:  Fair  Speech:  Clear and Coherent  Volume:  Normal  Mood:  Anxious  Affect:  Congruent  Thought Process:  Goal Directed and Descriptions of Associations: Intact  Orientation:  Full (Time, Place, and Person)  Thought Content: Logical   Suicidal Thoughts:  No  Homicidal Thoughts:  No  Memory:  Immediate;   Fair Recent;   Fair Remote;   Fair  Judgement:  Fair  Insight:  Fair  Psychomotor Activity:  Normal  Concentration:  Concentration: Fair and Attention Span: Fair  Recall:  [employer] of Knowledge: Fair  Language: Fair  Akathisia:  No  Handed:  Right  AIMS (if indicated): na  Assets:  Communication Skills Desire for Improvement Social Support  ADL's:  Intact  Cognition: WNL  Sleep:  improving   Screenings: PHQ2-9     Office Visit from 03/04/2016 in [place] Procedure visit from 01/21/2016 in [place] Clinical Support from 12/31/2015 in [place] Office Visit from 11/29/2015 in [place] Clinical Support from 10/04/2015 in [place]  PHQ-2 Total Score  0  0  0  0  0       Assessment and Plan: Todd Banks is a 67 year old Caucasian male who has a history of depression, chronic pain, presented to the clinic today for a follow-up visit.  Patient reports some improvement in his depressive symptoms on  the Zoloft.  He will continue psychotherapy with Ms. Royal Piedra.  We will continue medications as noted below.  Plan Depression Continue Wellbutrin XL 450 mg p.o. daily Continue Zoloft 50 mg p.o. Daily.  Anxiety symptoms Zoloft 50 mg p.o. daily Continue CBT  For insomnia Trazodone 150 mg p.o. nightly  Memory problems Pt had MMSE done on 09/17/2017-30 out of 30. Patient continues to report no worsening of symptoms at this time.  We will continue to monitor closely.   Follow-up in clinic in 2 months or sooner if needed.  More than 50 % of the time was spent for psychoeducation and supportive psychotherapy and care coordination.  This note was generated in part or whole with voice recognition software. Voice recognition is usually quite accurate but there are transcription errors that can and very often do occur. I apologize for any typographical errors that were not detected and corrected.       Ursula Alert, Banks 04/14/2018, 9:14 AM

## 2018-04-14 ENCOUNTER — Encounter: Payer: Self-pay | Admitting: Psychiatry

## 2018-04-19 ENCOUNTER — Ambulatory Visit (INDEPENDENT_AMBULATORY_CARE_PROVIDER_SITE_OTHER): Payer: 59 | Admitting: Licensed Clinical Social Worker

## 2018-04-19 DIAGNOSIS — F331 Major depressive disorder, recurrent, moderate: Secondary | ICD-10-CM

## 2018-04-19 NOTE — Progress Notes (Signed)
   THERAPIST PROGRESS NOTE  Session Time: 53min  Participation Level: Active  Behavioral Response: CasualAlertDepressed  Type of Therapy: Individual Therapy  Treatment Goals addressed: Diagnosis: Depression  Interventions: Motivational Interviewing  Summary: Todd Banks is a 67 y.o. male who presents with continued symptoms of diagnosis.  Patient reports mood as "favorable."  Patient reports that his daughter has moved in with him and his wife.  He reports that he wants her to move out but is afraid to have the conversation.  Patient vented his frustration.  Role Played how to communicate with his wife and to communicate with his daughter.  Encouraged Patient to use coping skills to decrease symptoms.  Suicidal/Homicidal: No  Plan: Return again in 2 weeks.  Diagnosis: Axis I: Depression    Axis II: No diagnosis    Lubertha South, LCSW 04/19/2018

## 2018-05-04 ENCOUNTER — Telehealth: Payer: Self-pay | Admitting: Psychiatry

## 2018-05-04 ENCOUNTER — Other Ambulatory Visit: Payer: Self-pay | Admitting: Psychiatry

## 2018-05-04 DIAGNOSIS — F331 Major depressive disorder, recurrent, moderate: Secondary | ICD-10-CM

## 2018-05-04 MED ORDER — SERTRALINE HCL 50 MG PO TABS: 50.0000 mg | ORAL_TABLET | Freq: Every day | ORAL | 0 refills | Status: DC

## 2018-05-04 NOTE — Telephone Encounter (Signed)
Sent Zoloft 90 days to [facility]

## 2018-05-20 ENCOUNTER — Ambulatory Visit: Payer: 59 | Admitting: Licensed Clinical Social Worker

## 2018-06-15 ENCOUNTER — Ambulatory Visit: Payer: Medicare Other | Admitting: Psychiatry

## 2018-06-17 ENCOUNTER — Other Ambulatory Visit: Payer: Self-pay

## 2018-06-18 NOTE — Discharge Instructions (Signed)

## 2018-06-18 NOTE — Anesthesia Preprocedure Evaluation (Addendum)
Anesthesia Evaluation  Patient identified by MRN, date of birth, ID band Patient awake    Reviewed: Allergy & Precautions, NPO status , Patient's Chart, lab work & pertinent test results  History of Anesthesia Complications Negative for: history of anesthetic complications  Airway Mallampati: III   Neck ROM: Full    Dental  (+)    Pulmonary former smoker (quit 1995),    Pulmonary exam normal breath sounds clear to auscultation       Cardiovascular Exercise Tolerance: Good hypertension, + CAD (s/p stents x3 on Effient)  Normal cardiovascular exam Rhythm:Regular Rate:Normal     Neuro/Psych PSYCHIATRIC DISORDERS Anxiety Depression negative neurological ROS     GI/Hepatic PUD, GERD  ,  Endo/Other  diabetes, Type 2, Insulin Dependent  Renal/GU Renal disease (hx nephrolithiasis)     Musculoskeletal   Abdominal   Peds  Hematology  (+) Blood dyscrasia, anemia , Skin cancer   Anesthesia Other Findings Cardiology note 06/03/18:  Assessment  67 y.o. male with  1. Essential hypertension  2. Type 2 diabetes mellitus with diabetic polyneuropathy, unspecified whether long term insulin use (CMS-HCC)  3. S/P cardiac catheterization  4. History of PTCA  5. H/O cardiac catheterization  6. Pure hypercholesterolemia   67 year old gentleman with known two-vessel coronary artery disease, with occluded mid LAD, occluded proximal nondominant RCA, 70% stenosis small-caliber PL2 branch and patent stent proximal left circumflex by cardiac catheterization 08/28/2015. The patient does experience chronic stable angina, with exertional chest pain, overall stable pattern, improved after starting Ranexa. Patient recently hospitalized for recurrent chest pain, with negative troponin. Ranexa was increased to thousand milligrams twice daily without recurrent chest pain. The patient has essential hypertension, blood pressure well controlled on current  BP medications.   Plan  1. Continue current medications 2. Counseled patient about low-sodium diet 3. DASH diet printed instructions given to the patient 4. Counseled patient about low-cholesterol diet 5. Continue pravastatin for hyperlipidemia management 6. Low-fat and cholesterol diet printed instructions given to the patient 7. Diabetes diet printed instructions given to the patient 8. Return to clinic for follow-up in 4 months  No orders of the defined types were placed in this encounter.  Return in about 4 months (around 10/02/2018).   Reproductive/Obstetrics                            Anesthesia Physical Anesthesia Plan  ASA: III  Anesthesia Plan: MAC   Post-op Pain Management:    Induction: Intravenous  PONV Risk Score and Plan: 1 and TIVA and Midazolam  Airway Management Planned: Natural Airway  Additional Equipment:   Intra-op Plan:   Post-operative Plan:   Informed Consent: I have reviewed the patients History and Physical, chart, labs and discussed the procedure including the risks, benefits and alternatives for the proposed anesthesia with the patient or authorized representative who has indicated his/her understanding and acceptance.     Plan Discussed with: CRNA  Anesthesia Plan Comments:        Anesthesia Quick Evaluation

## 2018-06-23 ENCOUNTER — Ambulatory Visit
Admission: RE | Admit: 2018-06-23 | Discharge: 2018-06-23 | Disposition: A | Discharge status: Home / Self Care | Payer: 59 | Source: Ambulatory Visit | Attending: Ophthalmology | Admitting: Ophthalmology

## 2018-06-23 ENCOUNTER — Ambulatory Visit: Payer: 59 | Admitting: Anesthesiology

## 2018-06-23 ENCOUNTER — Encounter
Admission: RE | Disposition: A | Discharge status: Home / Self Care | Payer: Self-pay | Source: Ambulatory Visit | Attending: Ophthalmology

## 2018-06-23 DIAGNOSIS — Z91040 Latex allergy status: Secondary | ICD-10-CM | POA: Insufficient documentation

## 2018-06-23 DIAGNOSIS — Z955 Presence of coronary angioplasty implant and graft: Secondary | ICD-10-CM | POA: Diagnosis not present

## 2018-06-23 DIAGNOSIS — E1136 Type 2 diabetes mellitus with diabetic cataract: Secondary | ICD-10-CM | POA: Insufficient documentation

## 2018-06-23 DIAGNOSIS — H9190 Unspecified hearing loss, unspecified ear: Secondary | ICD-10-CM | POA: Insufficient documentation

## 2018-06-23 DIAGNOSIS — M1990 Unspecified osteoarthritis, unspecified site: Secondary | ICD-10-CM | POA: Insufficient documentation

## 2018-06-23 DIAGNOSIS — Z881 Allergy status to other antibiotic agents status: Secondary | ICD-10-CM | POA: Insufficient documentation

## 2018-06-23 DIAGNOSIS — I10 Essential (primary) hypertension: Secondary | ICD-10-CM | POA: Diagnosis not present

## 2018-06-23 DIAGNOSIS — K219 Gastro-esophageal reflux disease without esophagitis: Secondary | ICD-10-CM | POA: Diagnosis not present

## 2018-06-23 DIAGNOSIS — H2512 Age-related nuclear cataract, left eye: Secondary | ICD-10-CM | POA: Diagnosis not present

## 2018-06-23 DIAGNOSIS — I2510 Atherosclerotic heart disease of native coronary artery without angina pectoris: Secondary | ICD-10-CM | POA: Diagnosis not present

## 2018-06-23 DIAGNOSIS — E7800 Pure hypercholesterolemia, unspecified: Secondary | ICD-10-CM | POA: Diagnosis not present

## 2018-06-23 DIAGNOSIS — Z87891 Personal history of nicotine dependence: Secondary | ICD-10-CM | POA: Insufficient documentation

## 2018-06-23 DIAGNOSIS — Z794 Long term (current) use of insulin: Secondary | ICD-10-CM | POA: Insufficient documentation

## 2018-06-23 DIAGNOSIS — Z79899 Other long term (current) drug therapy: Secondary | ICD-10-CM | POA: Insufficient documentation

## 2018-06-23 DIAGNOSIS — Z85828 Personal history of other malignant neoplasm of skin: Secondary | ICD-10-CM | POA: Insufficient documentation

## 2018-06-23 DIAGNOSIS — Z7982 Long term (current) use of aspirin: Secondary | ICD-10-CM | POA: Diagnosis not present

## 2018-06-23 DIAGNOSIS — Z79891 Long term (current) use of opiate analgesic: Secondary | ICD-10-CM | POA: Diagnosis not present

## 2018-06-23 DIAGNOSIS — E1142 Type 2 diabetes mellitus with diabetic polyneuropathy: Secondary | ICD-10-CM | POA: Diagnosis not present

## 2018-06-23 DIAGNOSIS — F329 Major depressive disorder, single episode, unspecified: Secondary | ICD-10-CM | POA: Diagnosis not present

## 2018-06-23 HISTORY — DX: Pure hypercholesterolemia, unspecified: E78.00

## 2018-06-23 HISTORY — PX: CATARACT EXTRACTION W/PHACO: SHX586

## 2018-06-23 HISTORY — DX: Headache, unspecified: R51.9

## 2018-06-23 HISTORY — DX: Headache: R51

## 2018-06-23 HISTORY — DX: Diverticulitis of intestine, part unspecified, without perforation or abscess without bleeding: K57.92

## 2018-06-23 HISTORY — DX: Age-related osteoporosis without current pathological fracture: M81.0

## 2018-06-23 HISTORY — DX: Dorsalgia, unspecified: M54.9

## 2018-06-23 HISTORY — DX: Myoneural disorder, unspecified: G70.9

## 2018-06-23 HISTORY — DX: Gastro-esophageal reflux disease without esophagitis: K21.9

## 2018-06-23 HISTORY — DX: Personal history of urinary calculi: Z87.442

## 2018-06-23 HISTORY — DX: Personal history of other medical treatment: Z92.89

## 2018-06-23 HISTORY — DX: Gastrojejunal ulcer, unspecified as acute or chronic, without hemorrhage or perforation: K28.9

## 2018-06-23 HISTORY — DX: Unspecified hearing loss, unspecified ear: H91.90

## 2018-06-23 HISTORY — DX: Malignant (primary) neoplasm, unspecified: C80.1

## 2018-06-23 LAB — GLUCOSE, CAPILLARY
Glucose-Capillary: 181 mg/dL — ABNORMAL HIGH (ref 70–99)
Glucose-Capillary: 158 mg/dL — ABNORMAL HIGH (ref 70–99)

## 2018-06-23 SURGERY — PHACOEMULSIFICATION, CATARACT, WITH IOL INSERTION
Anesthesia: Monitor Anesthesia Care | Site: Eye | Laterality: Left

## 2018-06-23 MED ORDER — BRIMONIDINE TARTRATE-TIMOLOL 0.2-0.5 % OP SOLN
OPHTHALMIC | Status: DC | PRN
  Administered 2018-06-23: 1 [drp] via OPHTHALMIC

## 2018-06-23 MED ORDER — ONDANSETRON HCL 4 MG/2ML IJ SOLN: 4.0000 mg | Freq: Once | INTRAMUSCULAR | Status: DC | PRN

## 2018-06-23 MED ORDER — LACTATED RINGERS IV SOLN: INTRAVENOUS | Status: DC

## 2018-06-23 MED ORDER — NA HYALUR & NA CHOND-NA HYALUR 0.4-0.35 ML IO KIT
PACK | INTRAOCULAR | Status: DC | PRN
  Administered 2018-06-23: 1 mL via INTRAOCULAR

## 2018-06-23 MED ORDER — ARMC OPHTHALMIC DILATING DROPS
1.0000 "application " | OPHTHALMIC | Status: DC | PRN
  Administered 2018-06-23 (×3): 1 via OPHTHALMIC

## 2018-06-23 MED ORDER — MOXIFLOXACIN HCL 0.5 % OP SOLN
1.0000 [drp] | OPHTHALMIC | Status: DC | PRN
  Administered 2018-06-23 (×3): 1 [drp] via OPHTHALMIC

## 2018-06-23 MED ORDER — MIDAZOLAM HCL 2 MG/2ML IJ SOLN
INTRAMUSCULAR | Status: DC | PRN
  Administered 2018-06-23: 2 mg via INTRAVENOUS

## 2018-06-23 MED ORDER — ACETAMINOPHEN 325 MG PO TABS: 650.0000 mg | ORAL_TABLET | Freq: Once | ORAL | Status: DC | PRN

## 2018-06-23 MED ORDER — MOXIFLOXACIN HCL 0.5 % OP SOLN
OPHTHALMIC | Status: DC | PRN
  Administered 2018-06-23: 0.2 mL via OPHTHALMIC

## 2018-06-23 MED ORDER — TETRACAINE HCL 0.5 % OP SOLN
1.0000 [drp] | OPHTHALMIC | Status: DC | PRN
  Administered 2018-06-23 (×2): 1 [drp] via OPHTHALMIC

## 2018-06-23 MED ORDER — ACETAMINOPHEN 160 MG/5ML PO SOLN: 325.0000 mg | ORAL | Status: DC | PRN

## 2018-06-23 MED ORDER — FENTANYL CITRATE (PF) 100 MCG/2ML IJ SOLN
INTRAMUSCULAR | Status: DC | PRN
  Administered 2018-06-23 (×2): 50 ug via INTRAVENOUS

## 2018-06-23 MED ORDER — LIDOCAINE HCL (PF) 2 % IJ SOLN
INTRAOCULAR | Status: DC | PRN
  Administered 2018-06-23: 1 mL

## 2018-06-23 MED ORDER — EPINEPHRINE PF 1 MG/ML IJ SOLN
INTRAOCULAR | Status: DC | PRN
  Administered 2018-06-23: 74 mL via OPHTHALMIC

## 2018-06-23 SURGICAL SUPPLY — 21 items
CANNULA ANT/CHMB 27G (MISCELLANEOUS) ×1 IMPLANT
CANNULA ANT/CHMB 27GA (MISCELLANEOUS) ×3 IMPLANT
GLOVE SURG LX 7.5 STRW (GLOVE) ×2
GLOVE SURG LX STRL 7.5 STRW (GLOVE) ×1 IMPLANT
GLOVE SURG TRIUMPH 8.0 PF LTX (GLOVE) ×3 IMPLANT
GOWN STRL REUS W/ TWL LRG LVL3 (GOWN DISPOSABLE) ×2 IMPLANT
GOWN STRL REUS W/TWL LRG LVL3 (GOWN DISPOSABLE) ×4
LENS IOL TECNIS ITEC 22.0 (Intraocular Lens) ×2 IMPLANT
MARKER SKIN DUAL TIP RULER LAB (MISCELLANEOUS) ×3 IMPLANT
NDL FILTER BLUNT 18X1 1/2 (NEEDLE) ×1 IMPLANT
NEEDLE FILTER BLUNT 18X 1/2SAF (NEEDLE) ×2
NEEDLE FILTER BLUNT 18X1 1/2 (NEEDLE) ×1 IMPLANT
PACK CATARACT BRASINGTON (MISCELLANEOUS) ×3 IMPLANT
PACK EYE AFTER SURG (MISCELLANEOUS) ×3 IMPLANT
PACK OPTHALMIC (MISCELLANEOUS) ×3 IMPLANT
RING MALYGIN 7.0 (MISCELLANEOUS) IMPLANT
SYR 3ML LL SCALE MARK (SYRINGE) ×3 IMPLANT
SYR 5ML LL (SYRINGE) ×3 IMPLANT
SYR TB 1ML LUER SLIP (SYRINGE) ×3 IMPLANT
WATER STERILE IRR 500ML POUR (IV SOLUTION) ×3 IMPLANT
WIPE NON LINTING 3.25X3.25 (MISCELLANEOUS) ×3 IMPLANT

## 2018-06-23 NOTE — H&P (Signed)

## 2018-06-23 NOTE — Anesthesia Procedure Notes (Signed)
Procedure Name: MAC Performed by: Analycia Khokhar, CRNA Pre-anesthesia Checklist: Patient identified, Emergency Drugs available, Suction available, Timeout performed and Patient being monitored Patient Re-evaluated:Patient Re-evaluated prior to induction Oxygen Delivery Method: Nasal cannula Placement Confirmation: positive ETCO2       

## 2018-06-23 NOTE — Op Note (Signed)
OPERATIVE NOTE  Todd Banks [id number] 06/23/2018   PREOPERATIVE DIAGNOSIS:  Nuclear sclerotic cataract left eye. H25.12   POSTOPERATIVE DIAGNOSIS:    Nuclear sclerotic cataract left eye.     PROCEDURE:  Phacoemusification with posterior chamber intraocular lens placement of the left eye   LENS:   Implant Name Type Inv. Item Serial No. Manufacturer Lot No. LRB No. Used  1 PIECE ASPHERIC ACRYLIC IOL Intraocular Lens  [id number]   Left 1     PCB00 22.0 D   ULTRASOUND TIME: 18  % of 1 minutes 42 seconds, CDE 18.7  SURGEON:  Wyonia Hough, MD   ANESTHESIA:  Topical with tetracaine drops and 2% Xylocaine jelly, augmented with 1% preservative-free intracameral lidocaine.    COMPLICATIONS:  None.   DESCRIPTION OF PROCEDURE:  The patient was identified in the holding room and transported to the operating room and placed in the supine position under the operating microscope.  The left eye was identified as the operative eye and it was prepped and draped in the usual sterile ophthalmic fashion.   A 1 millimeter clear-corneal paracentesis was made at the 1:30 position.  0.5 ml of preservative-free 1% lidocaine was injected into the anterior chamber.  The anterior chamber was filled with Viscoat viscoelastic.  A 2.4 millimeter keratome was used to make a near-clear corneal incision at the 10:30 position.  .  A curvilinear capsulorrhexis was made with a cystotome and capsulorrhexis forceps.  Balanced salt solution was used to hydrodissect and hydrodelineate the nucleus.   Phacoemulsification was then used in stop and chop fashion to remove the lens nucleus and epinucleus.  The remaining cortex was then removed using the irrigation and aspiration handpiece. Provisc was then placed into the capsular bag to distend it for lens placement.  A lens was then injected into the capsular bag.  The remaining viscoelastic was aspirated.   Wounds were hydrated with balanced salt solution.  The  anterior chamber was inflated to a physiologic pressure with balanced salt solution.  No wound leaks were noted. Vigamox 0.2 ml of a 1mg  per ml solution was injected into the anterior chamber for a dose of 0.2 mg of intracameral antibiotic at the completion of the case.   Timolol and Brimonidine drops were applied to the eye.  The patient was taken to the recovery room in stable condition without complications of anesthesia or surgery.  [name] 06/23/2018, 9:19 AM

## 2018-06-23 NOTE — Transfer of Care (Signed)
Immediate Anesthesia Transfer of Care Note  Patient: Todd Banks  Procedure(s) Performed: CATARACT EXTRACTION PHACO AND INTRAOCULAR LENS PLACEMENT (IOC)  COMPLICATED LEFT DIABETIC (Left Eye)  Patient Location: PACU  Anesthesia Type: MAC  Level of Consciousness: awake, alert  and patient cooperative  Airway and Oxygen Therapy: Patient Spontanous Breathing and Patient connected to supplemental oxygen  Post-op Assessment: Post-op Vital signs reviewed, Patient's Cardiovascular Status Stable, Respiratory Function Stable, Patent Airway and No signs of Nausea or vomiting  Post-op Vital Signs: Reviewed and stable  Complications: No apparent anesthesia complications

## 2018-06-23 NOTE — Anesthesia Postprocedure Evaluation (Signed)
Anesthesia Post Note  Patient: Todd Banks  Procedure(s) Performed: CATARACT EXTRACTION PHACO AND INTRAOCULAR LENS PLACEMENT (IOC)  COMPLICATED LEFT DIABETIC (Left Eye)  Patient location during evaluation: PACU Anesthesia Type: MAC Level of consciousness: awake and alert, oriented and patient cooperative Pain management: pain level controlled Vital Signs Assessment: post-procedure vital signs reviewed and stable Respiratory status: spontaneous breathing, nonlabored ventilation and respiratory function stable Cardiovascular status: blood pressure returned to baseline and stable Postop Assessment: adequate PO intake Anesthetic complications: no    Darrin Nipper

## 2018-06-24 ENCOUNTER — Encounter: Payer: Self-pay | Admitting: Ophthalmology

## 2018-07-01 NOTE — Progress Notes (Signed)
[facility] MD OP Progress Note  07/02/2018 10:50 AM Todd Banks  MRN:  [id number]  Chief Complaint: I am here for follow-up. Chief Complaint    Follow-up; Medication Refill     HPI: Todd Banks is a 67 year old Caucasian male, married, on SSI, lives in [place], has a history of depression, chronic pain, presented to the clinic today for a follow-up visit.  Patient today reports that he is overwhelmed due to the holiday season.  He reports a lot needs to be done and that makes him stressed out.  He reports he is however going to take a break after the holidays.  He is compliant on his Wellbutrin and Zoloft.  He is tolerating the Zoloft well.  Discussed readjusting the dosage of Zoloft and he agrees with plan.  He reports he continues to struggle with sleep on and off.  Will add a 25 mg of trazodone as needed to his current dosage.  He recently had cataract surgery and is recovering well.  He denies any suicidality, homicidality, perceptual disturbances.  He reports he has not been able to schedule therapy visit in a few months and is hoping to schedule it as soon as possible.   Visit Diagnosis:    ICD-10-CM   1. MDD (major depressive disorder), recurrent episode, moderate (HCC) F33.1 traZODone (DESYREL) 50 MG tablet    sertraline (ZOLOFT) 100 MG tablet  2. GAD (generalized anxiety disorder) F41.1 traZODone (DESYREL) 50 MG tablet    sertraline (ZOLOFT) 100 MG tablet    Past Psychiatric History: Reviewed past psychiatric history from my progress note on 10/27/2017 Past Medical History:  Past Medical History:  Diagnosis Date  . Anxiety   . Back pain   . Cancer ([place])    skin cancer, melanoma on nose  . Chronic back pain   . Constipation   . Coronary artery disease   . DDD (degenerative disc disease), lumbar    lumbar, wrist  . Depression   . Diabetes mellitus without complication ([place])   . Diabetes mellitus, type II ([place])   . Diverticulitis    diverticulosis  . Dizziness     occassional, if get up too fast  . GERD (gastroesophageal reflux disease)   . Headache    every other day  . Heart disease   . History of kidney stones   . HOH (hard of hearing)    left ear  . Hospitalization or health care facility admission within last 6 months    During summer; following bleeding after urinary catherterization. Early fall-assessment of chest pain, no significant findings  . Hypercholesterolemia   . Hypertension    controlled on meds  . Intermittent self-catheterization of bladder   . Kidney stones    h/o  . Neuromuscular disorder ([place])    nerve damage from back surgery, self catheterizes/ leg weakness  . Osteoporosis   . PUD (peptic ulcer disease)   . Shingles   . Ulcer of the stomach and intestine     Past Surgical History:  Procedure Laterality Date  . BACK SURGERY     nerve damage, has to self catheterize  . CARDIAC CATHETERIZATION N/A 08/28/2015   Procedure: Left Heart Cath and Coronary Angiography;  Surgeon: Isaias Cowman, MD;  Location: [facility];  Service: Cardiovascular;  Laterality: N/A;  . CATARACT EXTRACTION W/PHACO Left 06/23/2018   Procedure: CATARACT EXTRACTION PHACO AND INTRAOCULAR LENS PLACEMENT (IOC)  COMPLICATED LEFT DIABETIC;  Surgeon: Leandrew Koyanagi, MD;  Location: [facility];  Service: Ophthalmology;  Laterality: Left;  diabetic-insulin dependent, latex allergy  . CHOLECYSTECTOMY    . COLONOSCOPY WITH PROPOFOL N/A 08/07/2017   Procedure: COLONOSCOPY WITH PROPOFOL;  Surgeon: Manya Silvas, MD;  Location[facility];  Service: Endoscopy;  Laterality: N/A;  . CORONARY ANGIOPLASTY     2 stents  . CORONARY STENT PLACEMENT    . ESOPHAGOGASTRODUODENOSCOPY (EGD) WITH PROPOFOL N/A 09/29/2016   Procedure: ESOPHAGOGASTRODUODENOSCOPY (EGD) WITH PROPOFOL;  Surgeon: Manya Silvas, MD;  Location: [facility] ENDOSCOPY;  Service: Endoscopy;  Laterality: N/A;  . ESOPHAGOGASTRODUODENOSCOPY (EGD) WITH PROPOFOL N/A 08/07/2017    Procedure: ESOPHAGOGASTRODUODENOSCOPY (EGD) WITH PROPOFOL;  Surgeon: Manya Silvas, MD;  Location: [facility] ENDOSCOPY;  Service: Endoscopy;  Laterality: N/A;  . HAND SURGERY Right     Family Psychiatric History: Reviewed family psychiatric history from my progress note on 10/27/2017  Family History:  Family History  Problem Relation Age of Onset  . Diabetes Mother   . Heart disease Mother   . Hypertension Mother   . Depression Father   . Heart disease Father   . Hypertension Father   . Prostate cancer Neg Hx   . Bladder Cancer Neg Hx   . Kidney cancer Neg Hx     Social History: Reviewed social history from my progress note on 10/27/2017. Social History   Socioeconomic History  . Marital status: Married    Spouse name: lynette  . Number of children: 4  . Years of education: Not on file  . Highest education level: Some college, no degree  Occupational History    Comment: disabled  Social Needs  . Financial resource strain: Not hard at all  . Food insecurity:    Worry: Never true    Inability: Never true  . Transportation needs:    Medical: No    Non-medical: No  Tobacco Use  . Smoking status: Former Smoker    Types: Cigarettes    Last attempt to quit: 07/22/1993    Years since quitting: 24.9  . Smokeless tobacco: Never Used  Substance and Sexual Activity  . Alcohol use: No    Alcohol/week: 0.0 standard drinks  . Drug use: No  . Sexual activity: Not Currently  Lifestyle  . Physical activity:    Days per week: 0 days    Minutes per session: 0 min  . Stress: Not on file  Relationships  . Social connections:    Talks on phone: Never    Gets together: More than three times a week    Attends religious service: More than 4 times per year    Active member of club or organization: No    Attends meetings of clubs or organizations: Never    Relationship status: Married  Other Topics Concern  . Not on file  Social History Narrative  . Not on file    Allergies:   Allergies  Allergen Reactions  . Cephalexin Hives    Other reaction(s): Unknown Other reaction(s): Unknown  . Cephalosporins Swelling    Other Reaction: reacts to Keflex  . Penicillins Swelling    Childhood allergy Has patient had a PCN reaction causing immediate rash, facial/tongue/throat swelling, SOB or lightheadedness with hypotension: Unknown Has patient had a PCN reaction causing severe rash involving mucus membranes or skin necrosis: Unknown Has patient had a PCN reaction that required hospitalization: Unknown Has patient had a PCN reaction occurring within the last 10 years: No If all of the above answers are "NO", then may proceed with Cephalosporin use.   Marland Kitchen  Ampicillin Swelling    Other reaction(s): Unknown  . Cefazolin Swelling    Other reaction(s): Other (qualifier value)  . Cefuroxime Axetil Swelling  . Ciprofloxacin Swelling    Other reaction(s): Respiratory distress (finding), Weal  . Clindamycin Swelling  . Doxycycline Swelling    Other reaction(s): Unknown  . Erythromycin Swelling    Other reaction(s): Unknown  . Latex Other (See Comments)    Other reaction(s): BLISTERS  . Levofloxacin Swelling    Other reaction(s): Altered mental status (finding), Itching of Skin  . Other Swelling    All Mycins..  . Pork (Porcine) Protein     Other reaction(s): Other (qualifier value)  . Prevacid [Lansoprazole]     Other reaction(s): Unknown Other reaction(s): Unknown    Metabolic Disorder Labs: No results found for: HGBA1C, MPG Lab Results  Component Value Date   PROLACTIN 19.0 (H) 03/26/2017   No results found for: CHOL, TRIG, HDL, CHOLHDL, VLDL, LDLCALC Lab Results  Component Value Date   TSH 1.387 01/12/2018    Therapeutic Level Labs: No results found for: LITHIUM No results found for: VALPROATE No components found for:  CBMZ  Current Medications: Current Outpatient Medications  Medication Sig Dispense Refill  . aspirin 325 MG tablet Take 325 mg by  mouth daily.    . Blood Glucose Monitoring Suppl (ONE TOUCH ULTRA MINI) w/Device KIT     . buPROPion (WELLBUTRIN XL) 150 MG 24 hr tablet Take 1 tablet (150 mg total) by mouth daily. To be taken along with 300 mg 90 tablet 1  . buPROPion (WELLBUTRIN XL) 300 MG 24 hr tablet Take 1 tablet (300 mg total) by mouth daily. To be combined with 150 mg 90 tablet 1  . cyanocobalamin (,VITAMIN B-12,) 1000 MCG/ML injection Inject 1,000 mcg into the muscle every 30 (thirty) days.     Marland Kitchen docusate sodium (COLACE) 50 MG capsule Take 100 mg by mouth daily.     Marland Kitchen gabapentin (NEURONTIN) 300 MG capsule Take 300 mg by mouth at bedtime.     Marland Kitchen gemfibrozil (LOPID) 600 MG tablet Take 600 mg by mouth 2 (two) times daily before a meal.    . glucose blood (ONE TOUCH ULTRA TEST) test strip Use as instructed 5 times daily.    . hydrochlorothiazide (HYDRODIURIL) 25 MG tablet Take by mouth.    . hydrOXYzine (ATARAX/VISTARIL) 10 MG tablet TAKE 1-2 TABLETS (10-20 MG TOTAL) BY MOUTH 2 (TWO) TIMES DAILY AS NEEDED FOR ANXIETY. 120 tablet 1  . Insulin Glargine (LANTUS SOLOSTAR) 100 UNIT/ML Solostar Pen INJECT SUBCUTANEOUSLY 70  UNITS NIGHTLY    . insulin lispro (HUMALOG KWIKPEN) 100 UNIT/ML KiwkPen Inject 15 Units into the skin 3 (three) times daily.     . Insulin Pen Needle (EXEL COMFORT POINT PEN NEEDLE) 29G X 12MM MISC Use one four times daily.    . isosorbide mononitrate (IMDUR) 30 MG 24 hr tablet Take 30 mg by mouth daily.     Marland Kitchen lisinopril (PRINIVIL,ZESTRIL) 10 MG tablet Take by mouth.    Marland Kitchen lisinopril-hydrochlorothiazide (PRINZIDE,ZESTORETIC) 20-25 MG tablet Take 1 tablet by mouth daily.     . metFORMIN (GLUCOPHAGE) 1000 MG tablet Take 1,000 mg by mouth 2 (two) times daily with a meal.    . metoprolol succinate (TOPROL-XL) 25 MG 24 hr tablet Take 25 mg by mouth daily.    Marland Kitchen NEEDLE, DISP, 25 G (BD SAFETYGLIDE SHIELDED NEEDLE) 25G X 1" MISC Use 1 each monthly. for B12 injections    . nitroGLYCERIN (NITROSTAT)  0.4 MG SL tablet Place  0.4 mg under the tongue every 5 (five) minutes as needed for chest pain.     Marland Kitchen omeprazole (PRILOSEC) 40 MG capsule Take 40 mg by mouth daily.     . orphenadrine (NORFLEX) 100 MG tablet Take 1 tablet (100 mg total) by mouth at bedtime.    . Oxycodone HCl 20 MG TABS LIMIT 1/2 - 1 TABLET BY MOUTH 3 - 5 TIMES PER DAY. NOTE TABLET IS STRONGER  0  . prasugrel (EFFIENT) 10 MG TABS tablet Take 10 mg by mouth daily.    . pravastatin (PRAVACHOL) 40 MG tablet Take 40 mg by mouth daily.    Marland Kitchen RANEXA 500 MG 12 hr tablet Two tablets twice a day 120 tablet 0  . sertraline (ZOLOFT) 100 MG tablet Take 1 tablet (100 mg total) by mouth daily. 90 tablet 0  . sucralfate (CARAFATE) 1 g tablet TAKE 1 TABLET (1 G TOTAL) BY MOUTH 4 (FOUR) TIMES DAILY BEFORE MEALS AND NIGHTLY  11  . SYRINGE-NEEDLE, DISP, 3 ML (MEDSAVER SYRINGE 3CC/23GX1") 23G X 1" 3 ML MISC Use as directed with  cyanocobalamin    . traZODone (DESYREL) 150 MG tablet Take 1 tablet (150 mg total) by mouth at bedtime. 90 tablet 1  . traZODone (DESYREL) 50 MG tablet Take 0.5 tablets (25 mg total) by mouth at bedtime as needed for sleep. To be added to 150 mg as needed 90 tablet 0  . TRULICITY 1.5 VV/7.4MO SOPN      No current facility-administered medications for this visit.      Musculoskeletal: Strength & Muscle Tone: within normal limits Gait & Station: normal Patient leans: N/A  Psychiatric Specialty Exam: Review of Systems  Psychiatric/Behavioral: Positive for depression. The patient has insomnia.   All other systems reviewed and are negative.   Blood pressure 139/68, pulse 86, temperature 99.3 F (37.4 C), temperature source Oral, weight 201 lb 6.4 oz (91.4 kg).Body mass index is 29.74 kg/m.  General Appearance: Casual  Eye Contact:  Fair  Speech:  Normal Rate  Volume:  Normal  Mood:  Dysphoric  Affect:  Congruent  Thought Process:  Goal Directed and Descriptions of Associations: Intact  Orientation:  Full (Time, Place, and Person)   Thought Content: Logical   Suicidal Thoughts:  No  Homicidal Thoughts:  No  Memory:  Immediate;   Fair Recent;   Fair Remote;   Fair  Judgement:  Fair  Insight:  Fair  Psychomotor Activity:  Normal  Concentration:  Concentration: Fair and Attention Span: Fair  Recall:  [employer] of Knowledge: Fair  Language: Fair  Akathisia:  No  Handed:  Right  AIMS (if indicated): denies tremors, rigidity,stiffness  Assets:  Communication Skills Desire for Improvement Social Support  ADL's:  Intact  Cognition: WNL  Sleep:  Poor   Screenings: PHQ2-9     Office Visit from 03/04/2016 in [facility] Procedure visit from 01/21/2016 in [place] Clinical Support from 12/31/2015 in [place] Office Visit from 11/29/2015 in [facility] Clinical Support from 10/04/2015 in [place]  PHQ-2 Total Score  0  0  0  0  0       Assessment and Plan: Todd Banks is a 67 year old Caucasian male who has a history of depression, chronic pain, presented to the clinic today for a follow-up visit.  Pt continues to have some depressive symptoms as well as sleep issues.  Will make the following medication readjustment.  He will continue psychotherapy with Ms. Peacock.  Plan Depression Wellbutrin XL 450 mg p.o. daily Increase Zoloft to 100 mg p.o. daily  For anxiety symptoms Zoloft 100 mg p.o. daily Continue CBT  For insomnia Increase trazodone to 175 mg p.o. nightly  Memory problems MMSE done on 09/17/2017-30 out of 30. Monitor closely.  Follow-up in clinic in 2 months or sooner if needed.  More than 50 % of the time was spent for psychoeducation and supportive psychotherapy and care coordination.  This note was generated in part or whole with voice recognition software. Voice recognition is  usually quite accurate but there are transcription errors that can and very often do occur. I apologize for any typographical errors that were not detected and corrected.          Ursula Alert, MD 07/02/2018, 10:50 AM

## 2018-07-02 ENCOUNTER — Ambulatory Visit (INDEPENDENT_AMBULATORY_CARE_PROVIDER_SITE_OTHER): Payer: 59 | Admitting: Psychiatry

## 2018-07-02 ENCOUNTER — Encounter: Payer: Self-pay | Admitting: Psychiatry

## 2018-07-02 ENCOUNTER — Other Ambulatory Visit: Payer: Self-pay

## 2018-07-02 VITALS — BP 139/68 | HR 86 | Temp 99.3°F | Wt 201.4 lb

## 2018-07-02 DIAGNOSIS — F331 Major depressive disorder, recurrent, moderate: Secondary | ICD-10-CM

## 2018-07-02 DIAGNOSIS — F411 Generalized anxiety disorder: Secondary | ICD-10-CM | POA: Diagnosis not present

## 2018-07-02 MED ORDER — TRAZODONE HCL 50 MG PO TABS: 25.0000 mg | ORAL_TABLET | Freq: Every evening | ORAL | 0 refills | Status: DC | PRN

## 2018-07-02 MED ORDER — SERTRALINE HCL 100 MG PO TABS: 100.0000 mg | ORAL_TABLET | Freq: Every day | ORAL | 0 refills | Status: DC

## 2018-07-08 ENCOUNTER — Other Ambulatory Visit: Payer: Self-pay | Admitting: Psychiatry

## 2018-07-08 DIAGNOSIS — F331 Major depressive disorder, recurrent, moderate: Secondary | ICD-10-CM

## 2018-07-08 DIAGNOSIS — F411 Generalized anxiety disorder: Secondary | ICD-10-CM

## 2018-09-02 ENCOUNTER — Other Ambulatory Visit: Payer: Self-pay | Admitting: Psychiatry

## 2018-09-02 ENCOUNTER — Ambulatory Visit: Payer: Medicare Other | Admitting: Psychiatry

## 2018-09-02 DIAGNOSIS — F331 Major depressive disorder, recurrent, moderate: Secondary | ICD-10-CM

## 2018-09-02 DIAGNOSIS — F411 Generalized anxiety disorder: Secondary | ICD-10-CM

## 2018-09-21 ENCOUNTER — Other Ambulatory Visit: Payer: Self-pay | Admitting: Psychiatry

## 2018-09-21 DIAGNOSIS — F331 Major depressive disorder, recurrent, moderate: Secondary | ICD-10-CM

## 2018-09-23 ENCOUNTER — Ambulatory Visit: Payer: Medicare Other | Admitting: Psychiatry

## 2018-09-27 ENCOUNTER — Ambulatory Visit (INDEPENDENT_AMBULATORY_CARE_PROVIDER_SITE_OTHER): Payer: Medicare Other | Admitting: Psychiatry

## 2018-09-27 ENCOUNTER — Other Ambulatory Visit: Payer: Self-pay

## 2018-09-27 ENCOUNTER — Encounter: Payer: Self-pay | Admitting: Psychiatry

## 2018-09-27 VITALS — BP 137/79 | HR 67 | Temp 98.5°F

## 2018-09-27 DIAGNOSIS — F5105 Insomnia due to other mental disorder: Secondary | ICD-10-CM

## 2018-09-27 DIAGNOSIS — F411 Generalized anxiety disorder: Secondary | ICD-10-CM

## 2018-09-27 DIAGNOSIS — F331 Major depressive disorder, recurrent, moderate: Secondary | ICD-10-CM | POA: Diagnosis not present

## 2018-09-27 MED ORDER — TRAZODONE HCL 50 MG PO TABS: 25.0000 mg | ORAL_TABLET | Freq: Every evening | ORAL | 1 refills | Status: DC | PRN

## 2018-09-27 MED ORDER — BUPROPION HCL ER (XL) 300 MG PO TB24: 300.0000 mg | ORAL_TABLET | Freq: Every day | ORAL | 1 refills | Status: DC

## 2018-09-27 MED ORDER — BUPROPION HCL ER (XL) 150 MG PO TB24: 150.0000 mg | ORAL_TABLET | Freq: Every day | ORAL | 1 refills | Status: DC
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
## 2018-10-28 ENCOUNTER — Other Ambulatory Visit: Payer: Self-pay

## 2018-10-28 ENCOUNTER — Ambulatory Visit (INDEPENDENT_AMBULATORY_CARE_PROVIDER_SITE_OTHER): Payer: Managed Care, Other (non HMO) | Admitting: Psychiatry

## 2018-10-28 ENCOUNTER — Encounter: Payer: Self-pay | Admitting: Psychiatry

## 2018-10-28 DIAGNOSIS — F331 Major depressive disorder, recurrent, moderate: Secondary | ICD-10-CM

## 2018-10-28 DIAGNOSIS — F411 Generalized anxiety disorder: Secondary | ICD-10-CM

## 2018-10-28 DIAGNOSIS — F5105 Insomnia due to other mental disorder: Secondary | ICD-10-CM

## 2018-10-28 MED ORDER — SERTRALINE HCL 100 MG PO TABS: 100.0000 mg | ORAL_TABLET | Freq: Every day | ORAL | 1 refills | Status: DC
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
## 2018-10-28 NOTE — Progress Notes (Signed)
TC on  10-28-18 @ 1:20 pt medical and surgical hx was reviewed with updates.  Pt allergies was reviewed with no changes. Pt medications and pharmacy were reviewed and updated. No vitals taken because this is a phone consult.

## 2018-12-03 ENCOUNTER — Ambulatory Visit (INDEPENDENT_AMBULATORY_CARE_PROVIDER_SITE_OTHER): Payer: Managed Care, Other (non HMO) | Admitting: Psychiatry

## 2018-12-03 ENCOUNTER — Encounter: Payer: Self-pay | Admitting: Psychiatry

## 2018-12-03 ENCOUNTER — Other Ambulatory Visit: Payer: Self-pay

## 2018-12-03 DIAGNOSIS — F331 Major depressive disorder, recurrent, moderate: Secondary | ICD-10-CM

## 2018-12-03 DIAGNOSIS — F5105 Insomnia due to other mental disorder: Secondary | ICD-10-CM | POA: Diagnosis not present

## 2018-12-03 DIAGNOSIS — F411 Generalized anxiety disorder: Secondary | ICD-10-CM | POA: Diagnosis not present

## 2018-12-03 NOTE — Progress Notes (Signed)
Virtual Visit via Telephone Note  I connected with Todd Banks on 12/03/18 at 11:30 AM EDT by telephone and verified that I am speaking with the correct person using two identifiers.   I discussed the limitations, risks, security and privacy concerns of performing an evaluation and management service by telephone and the availability of in person appointments. I also discussed with the patient that there may be a patient responsible charge related to this service. The patient expressed understanding and agreed to proceed.   I discussed the assessment and treatment plan with the patient. The patient was provided an opportunity to ask questions and all were answered. The patient agreed with the plan and demonstrated an understanding of the instructions.   The patient was advised to call back or seek an in-person evaluation if the symptoms worsen or if the condition fails to improve as anticipated.  [facility] MD OP Progress Note  12/03/2018 12:31 PM Todd Banks  MRN:  [id number]  Chief Complaint:  Chief Complaint    Follow-up     HPI: Todd Banks is a 68 year old Caucasian male, married, has a history of MDD, GAD, insomnia, chronic pain was evaluated by phone today.  Patient today reports he is currently doing well on the current medication regimen.  He is mildly anxious about the current situation.  He reports he however has been coping okay.  His daughter who went through trauma herself recently is improving.  That is a relief for him.  His wife continues to work and is doing okay.  He reports sleep is fair.  Patient denies any suicidality, homicidality or perceptual disturbances.  Patient has been compliant with his medications and is tolerating them well.  Patient denies any other concerns today.  Visit Diagnosis:    ICD-10-CM   1. MDD (major depressive disorder), recurrent episode, moderate (HCC) F33.1   2. GAD (generalized anxiety disorder) F41.1   3. Insomnia due to mental  condition F51.05     Past Psychiatric History: I have reviewed past psychiatric history from my progress note on 10/27/2017.  Past Medical History:  Past Medical History:  Diagnosis Date  . Anxiety   . Back pain   . Cancer ([place])    skin cancer, melanoma on nose  . Chronic back pain   . Constipation   . Coronary artery disease   . DDD (degenerative disc disease), lumbar    lumbar, wrist  . Depression   . Diabetes mellitus without complication ([place])   . Diabetes mellitus, type II ([place])   . Diverticulitis    diverticulosis  . Dizziness    occassional, if get up too fast  . GERD (gastroesophageal reflux disease)   . Headache    every other day  . Heart disease   . History of kidney stones   . HOH (hard of hearing)    left ear  . Hospitalization or health care facility admission within last 6 months    During summer; following bleeding after urinary catherterization. Early fall-assessment of chest pain, no significant findings  . Hypercholesterolemia   . Hypertension    controlled on meds  . Intermittent self-catheterization of bladder   . Kidney stones    h/o  . Neuromuscular disorder ([place])    nerve damage from back surgery, self catheterizes/ leg weakness  . Osteoporosis   . PUD (peptic ulcer disease)   . Shingles   . Ulcer of the stomach and intestine     Past Surgical History:  Procedure  Laterality Date  . BACK SURGERY     nerve damage, has to self catheterize  . CARDIAC CATHETERIZATION N/A 08/28/2015   Procedure: Left Heart Cath and Coronary Angiography;  Surgeon: Isaias Cowman, MD;  Location: [facility];  Service: Cardiovascular;  Laterality: N/A;  . CATARACT EXTRACTION W/PHACO Left 06/23/2018   Procedure: CATARACT EXTRACTION PHACO AND INTRAOCULAR LENS PLACEMENT ([place])  COMPLICATED LEFT DIABETIC;  Surgeon: Leandrew Koyanagi, MD;  Location: [facility];  Service: Ophthalmology;  Laterality: Left;  diabetic-insulin dependent, latex allergy   . CHOLECYSTECTOMY    . COLONOSCOPY WITH PROPOFOL N/A 08/07/2017   Procedure: COLONOSCOPY WITH PROPOFOL;  Surgeon: Manya Silvas, MD;  [facility] END[facility]COPY;  Service: Endoscopy;  Laterality: N/A;  . CORONARY ANGIOPLASTY     2 stents  . CORONARY STENT PLACEMENT    . ESOPHAGOGASTRODUODENOSCOPY (EGD) WITH PROPOFOL N/A 09/29/2016   Procedure: ESOPHAGOGASTRODUODENOSCOPY (EGD) WITH PROPOFOL;  Surgeon: Manya Silvas, MD;  Location: [facility] ENDOSCOPY;  Service: Endoscopy;  Laterality: N/A;  . ESOPHAGOGASTRODUODENOSCOPY (EGD) WITH PROPOFOL N/A 08/07/2017   Procedure: ESOPHAGOGASTRODUODENOSCOPY (EGD) WITH PROPOFOL;  Surgeon: Manya Silvas, MD;  Location: [facility] ENDOSCOPY;  Service: Endoscopy;  Laterality: N/A;  . HAND SURGERY Right     Family Psychiatric History: Reviewed family psychiatric history from my progress note on 10/27/2017.  Family History:  Family History  Problem Relation Age of Onset  . Diabetes Mother   . Heart disease Mother   . Hypertension Mother   . Depression Father   . Heart disease Father   . Hypertension Father   . Prostate cancer Neg Hx   . Bladder Cancer Neg Hx   . Kidney cancer Neg Hx     Social History: I have reviewed family psychiatric history from my progress note on 10/27/2017. Social History   Socioeconomic History  . Marital status: Married    Spouse name: lynette  . Number of children: 4  . Years of education: Not on file  . Highest education level: Some college, no degree  Occupational History    Comment: disabled  Social Needs  . Financial resource strain: Not hard at all  . Food insecurity:    Worry: Never true    Inability: Never true  . Transportation needs:    Medical: No    Non-medical: No  Tobacco Use  . Smoking status: Former Smoker    Types: Cigarettes    Last attempt to quit: 07/22/1993    Years since quitting: 25.3  . Smokeless tobacco: Never Used  Substance and Sexual Activity  . Alcohol use: No    Alcohol/week: 0.0  standard drinks  . Drug use: No  . Sexual activity: Not Currently  Lifestyle  . Physical activity:    Days per week: 0 days    Minutes per session: 0 min  . Stress: Not on file  Relationships  . Social connections:    Talks on phone: Never    Gets together: More than three times a week    Attends religious service: More than 4 times per year    Active member of club or organization: No    Attends meetings of clubs or organizations: Never    Relationship status: Married  Other Topics Concern  . Not on file  Social History Narrative  . Not on file    Allergies:  Allergies  Allergen Reactions  . Cephalexin Hives    Other reaction(s): Unknown Other reaction(s): Unknown  . Cephalosporins Swelling  Other Reaction: reacts to Keflex  . Penicillins Swelling    Childhood allergy Has patient had a PCN reaction causing immediate rash, facial/tongue/throat swelling, SOB or lightheadedness with hypotension: Unknown Has patient had a PCN reaction causing severe rash involving mucus membranes or skin necrosis: Unknown Has patient had a PCN reaction that required hospitalization: Unknown Has patient had a PCN reaction occurring within the last 10 years: No If all of the above answers are "NO", then may proceed with Cephalosporin use.   . Ampicillin Swelling    Other reaction(s): Unknown  . Cefazolin Swelling    Other reaction(s): Other (qualifier value)  . Cefuroxime Axetil Swelling  . Ciprofloxacin Swelling    Other reaction(s): Respiratory distress (finding), We[place]  . Clindamycin Swelling  . Doxycycline Swelling    Other reaction(s): Unknown  . Erythromycin Swelling    Other reaction(s): Unknown  . Latex Other (See Comments)    Other reaction(s): BLISTERS  . Levofloxacin Swelling    Other reaction(s): Altered mental status (finding), Itching of Skin  . Other Swelling    All Mycins..  . Pork (Porcine) Protein     Other reaction(s): Other (qualifier value)  . Prevacid  [Lansoprazole]     Other reaction(s): Unknown Other reaction(s): Unknown    Metabolic Disorder Labs: No results found for: HGBA1C, MPG Lab Results  Component Value Date   PROLACTIN 19.0 (H) 03/26/2017   No results found for: CHOL, TRIG, HDL, CHOLHDL, VLDL, LDLCALC Lab Results  Component Value Date   TSH 1.387 01/12/2018    Therapeutic Level Labs: No results found for: LITHIUM No results found for: VALPROATE No components found for:  CBMZ  Current Medications: Current Outpatient Medications  Medication Sig Dispense Refill  . albuterol (PROVENTIL HFA;VENTOLIN HFA) 108 (90 Base) MCG/ACT inhaler Inhale into the lungs.    Marland Kitchen aspirin 325 MG tablet Take 325 mg by mouth daily.    . Blood Glucose Monitoring Suppl (ONE TOUCH ULTRA MINI) w/Device KIT     . buPROPion (WELLBUTRIN XL) 150 MG 24 hr tablet Take 1 tablet (150 mg total) by mouth daily. To be taken along with 300 mg 90 tablet 1  . buPROPion (WELLBUTRIN XL) 300 MG 24 hr tablet Take 1 tablet (300 mg total) by mouth daily. To be combined with 150 mg 90 tablet 1  . cyanocobalamin (,VITAMIN B-12,) 1000 MCG/ML injection Inject 1,000 mcg into the muscle every 30 (thirty) days.     Marland Kitchen docusate sodium (COLACE) 50 MG capsule Take 100 mg by mouth daily.     Marland Kitchen gabapentin (NEURONTIN) 300 MG capsule Take 300 mg by mouth at bedtime.     Marland Kitchen gemfibrozil (LOPID) 600 MG tablet Take 600 mg by mouth 2 (two) times daily before a meal.    . glucose blood (ONE TOUCH ULTRA TEST) test strip Use as instructed 5 times daily.    Marland Kitchen HUMALOG KWIKPEN 100 UNIT/ML KwikPen     . hydrochlorothiazide (HYDRODIURIL) 25 MG tablet Take by mouth.    . hydrOXYzine (ATARAX/VISTARIL) 10 MG tablet TAKE 1-2 TABLETS (10-20 MG TOTAL) BY MOUTH 2 (TWO) TIMES DAILY AS NEEDED FOR ANXIETY. 120 tablet 1  . Insulin Glargine (LANTUS SOLOSTAR) 100 UNIT/ML Solostar Pen INJECT SUBCUTANEOUSLY 70  UNITS NIGHTLY    . insulin lispro (HUMALOG KWIKPEN) 100 UNIT/ML KiwkPen Inject 15 Units into the  skin 3 (three) times daily.     . Insulin Pen Needle (EXEL COMFORT POINT PEN NEEDLE) 29G X 12MM MISC Use one four times daily.    Marland Kitchen  isosorbide mononitrate (IMDUR) 30 MG 24 hr tablet Take 30 mg by mouth daily.     Marland Kitchen lisinopril (PRINIVIL,ZESTRIL) 10 MG tablet Take by mouth.    . metFORMIN (GLUCOPHAGE) 1000 MG tablet Take 1,000 mg by mouth 2 (two) times daily with a meal.    . metoprolol succinate (TOPROL-XL) 25 MG 24 hr tablet Take 25 mg by mouth daily.    Marland Kitchen NEEDLE, DISP, 25 G (BD SAFETYGLIDE SHIELDED NEEDLE) 25G X 1" MISC Use 1 each monthly. for B12 injections    . nitroGLYCERIN (NITROSTAT) 0.4 MG SL tablet Place 0.4 mg under the tongue every 5 (five) minutes as needed for chest pain.     Marland Kitchen omeprazole (PRILOSEC) 40 MG capsule Take 40 mg by mouth daily.     . orphenadrine (NORFLEX) 100 MG tablet Take 1 tablet (100 mg total) by mouth at bedtime.    . Oxycodone HCl 20 MG TABS LIMIT 1/2 - 1 TABLET BY MOUTH 3 - 5 TIMES PER DAY. NOTE TABLET IS STRONGER  0  . prasugrel (EFFIENT) 10 MG TABS tablet Take 10 mg by mouth daily.    Marland Kitchen PROAIR HFA 108 (90 Base) MCG/ACT inhaler INHALE 2 PUFFS INTO THE LUNGS EVERY 6 (SIX) HOURS AS NEEDED FOR WHEEZING OR SHORTNESS OF BREATH    . RANEXA 500 MG 12 hr tablet Two tablets twice a day 120 tablet 0  . sertraline (ZOLOFT) 100 MG tablet Take 1 tablet (100 mg total) by mouth daily. 90 tablet 1  . sucralfate (CARAFATE) 1 g tablet TAKE 1 TABLET (1 G TOTAL) BY MOUTH 4 (FOUR) TIMES DAILY BEFORE MEALS AND NIGHTLY  11  . SYRINGE-NEEDLE, DISP, 3 ML (MEDSAVER SYRINGE 3CC/23GX1") 23G X 1" 3 ML MISC Use as directed with  cyanocobalamin    . traZODone (DESYREL) 150 MG tablet TAKE 1 TABLET BY MOUTH AT  BEDTIME 90 tablet 1  . traZODone (DESYREL) 50 MG tablet Take 0.5-1 tablets (25-50 mg total) by mouth at bedtime as needed for sleep. To be added to 150 mg as needed 90 tablet 1  . TRULICITY 1.5 EY/8.1KG SOPN      No current facility-administered medications for this visit.       Musculoskeletal: Strength & Muscle Tone: UTA Gait & Station: UTA Patient leans: N/A  Psychiatric Specialty Exam: Review of Systems  Psychiatric/Behavioral: The patient is nervous/anxious.   All other systems reviewed and are negative.   There were no vitals taken for this visit.There is no height or weight on file to calculate BMI.  General Appearance: UTA  Eye Contact:  UTA  Speech:  Clear and Coherent  Volume:  Normal  Mood:  Anxious coping ok  Affect:  UTA  Thought Process:  Goal Directed and Descriptions of Associations: Intact  Orientation:  Full (Time, Place, and Person)  Thought Content: Logical   Suicidal Thoughts:  No  Homicidal Thoughts:  No  Memory:  Immediate;   Fair Recent;   Fair Remote;   Fair  Judgement:  Fair  Insight:  Fair  Psychomotor Activity:  UTA  Concentration:  Concentration: Fair and Attention Span: Fair  Recall:  [employer] of Knowledge: Fair  Language: Fair  Akathisia:  No  Handed:  Right  AIMS (if indicated): denies tremors, rigidity  Assets:  Communication Skills Desire for Improvement Social Support  ADL's:  Intact  Cognition: WNL  Sleep:  Fair   Screenings: PHQ2-9     Office Visit from 03/04/2016 in [facility] Procedure visit from 01/21/2016 in [facility] Clinical Support from 12/31/2015 in [facility] Office Visit from 11/29/2015 in [facility] Clinical Support from 10/04/2015 in [facility]  PHQ-2 Total Score  0  0  0  0  0       Assessment and Plan: Todd Banks is a 68 year old Caucasian male, has a history of depression, anxiety, chronic pain was evaluated by phone today.  Patient with several psychosocial stressors like his own health issues, daughter's recent trauma, COVID-19 outbreak.  Patient however has been  coping okay and reports medications as beneficial.  Plan as noted below.  Plan For depression- improving Wellbutrin extended release 450 mg p.o. daily Zoloft 100 mg p.o. daily  For GAD- improving Zoloft 100 mg p.o. daily He will continue to work with his therapist  For insomnia - improving Trazodone 150-200 mg p.o. nightly as needed  Follow-up in clinic in 2 to 3 months or sooner if needed -  scheduled for August 19 at 10 AM  I have spent atleast 15 minutes non face to face with patient today. More than 50 % of the time was spent for psychoeducation and supportive psychotherapy and care coordination.  This note was generated in part or whole with voice recognition software. Voice recognition is usually quite accurate but there are transcription errors that can and very often do occur. I apologize for any typographical errors that were not detected and corrected.         Ursula Alert, MD 12/03/2018, 12:31 PM

## 2018-12-28 ENCOUNTER — Ambulatory Visit: Payer: Medicare Other | Admitting: Psychiatry

## 2019-03-02 ENCOUNTER — Other Ambulatory Visit: Payer: Self-pay

## 2019-03-02 ENCOUNTER — Ambulatory Visit (INDEPENDENT_AMBULATORY_CARE_PROVIDER_SITE_OTHER): Payer: Medicare Other | Admitting: Psychiatry

## 2019-03-02 DIAGNOSIS — Z5329 Procedure and treatment not carried out because of patient's decision for other reasons: Secondary | ICD-10-CM

## 2019-03-02 NOTE — Progress Notes (Signed)
No response to call 

## 2019-03-16 ENCOUNTER — Other Ambulatory Visit: Payer: Self-pay | Admitting: Psychiatry

## 2019-03-16 DIAGNOSIS — F411 Generalized anxiety disorder: Secondary | ICD-10-CM

## 2019-03-16 DIAGNOSIS — F331 Major depressive disorder, recurrent, moderate: Secondary | ICD-10-CM

## 2019-04-06 DIAGNOSIS — E1142 Type 2 diabetes mellitus with diabetic polyneuropathy: Secondary | ICD-10-CM | POA: Insufficient documentation

## 2019-04-07 ENCOUNTER — Telehealth: Payer: Self-pay | Admitting: Physician Assistant

## 2019-04-07 NOTE — Telephone Encounter (Signed)
Pt called office and he caths himself.  He is having burning w/urination and cloudy urine.  He thinks he might have a bladder infection.  I added him to Sam's schedule tomorrow.  He has been to walk in and to Dr Kary Kos and took abx, but no reilef.

## 2019-04-08 ENCOUNTER — Ambulatory Visit (INDEPENDENT_AMBULATORY_CARE_PROVIDER_SITE_OTHER): Payer: Managed Care, Other (non HMO) | Admitting: Physician Assistant

## 2019-04-08 ENCOUNTER — Other Ambulatory Visit: Payer: Self-pay

## 2019-04-08 ENCOUNTER — Encounter: Payer: Self-pay | Admitting: Physician Assistant

## 2019-04-08 VITALS — BP 138/75 | HR 74 | Ht 69.0 in | Wt 199.6 lb

## 2019-04-08 DIAGNOSIS — N3001 Acute cystitis with hematuria: Secondary | ICD-10-CM

## 2019-04-08 DIAGNOSIS — N319 Neuromuscular dysfunction of bladder, unspecified: Secondary | ICD-10-CM | POA: Diagnosis not present

## 2019-04-08 DIAGNOSIS — R300 Dysuria: Secondary | ICD-10-CM | POA: Diagnosis not present

## 2019-04-08 LAB — URINALYSIS, COMPLETE
Bilirubin, UA: NEGATIVE
Glucose, UA: NEGATIVE
Nitrite, UA: POSITIVE — AB
Specific Gravity, UA: >1.03 — ABNORMAL HIGH (ref 1.005–1.030)
Urobilinogen, Ur: 0.2 mg/dL (ref 0.2–1.0)
pH, UA: 5 (ref 5.0–7.5)

## 2019-04-08 LAB — MICROSCOPIC EXAMINATION: WBC, UA: >30 /hpf — AB (ref 0–5)

## 2019-04-08 MED ORDER — SULFAMETHOXAZOLE-TRIMETHOPRIM 800-160 MG PO TABS: 1.0000 | ORAL_TABLET | Freq: Two times a day (BID) | ORAL | 0 refills | Status: AC

## 2019-04-08 NOTE — Progress Notes (Signed)
04/08/2019 1:52 PM   Todd Banks 27-May-1951 [id number]  CC: Dysuria, cloudy urine  HPI: Todd Banks is a 68 y.o. male who presents today for evaluation of possible UTI. He is an established [facility] patient who last saw Todd Banks on 04/15/2017 for follow-up with a recent diagnosis of hypogonadism, also with erectile dysfunction, BPH with LUTS, and nocturia. He CICs for management of a neurogenic bladder following back surgery approximately 20 years ago.  Patient reports he is overall been well in the interim.  He states he has had occasional urinary tract infections, which he has sought care for with his PCP.  He reports difficulty over the past 2 months with either recurrent or persistent urinary tract infection after reusing dirty catheters for CIC during a trip in which he packed the wrong catheters.  Two recent positive urine cultures from 03/06/2019 and 03/22/2019 with multi-drug resistant Klebsiella aerogenes.  He was treated with Bactrim DS twice daily x7 days in August, and then with Macrobid 100 mg twice daily x10 days in September.  He reports slight symptom improvement following treatment with Bactrim, however no symptom improvement following treatment with Macrobid.  Of note, his urine culture returned with intermediate susceptibility to Macrobid.  He reports frequency, urgency, dysuria, and intermittent gross hematuria associated with this recent string of infections.  He has been CICing up to 8-10 times daily.  He denies gross hematuria associated with these most recent infections.  In-office UA today positive for 2+ blood, 2+ protein, nitrites, and 3+ leukocyte esterase; urine microscopy with >30 WBCs/HPF, 11-30 RBCs/HPF, amorphous sediment, and moderate bacteria.  Additionally, he reports that he has been having difficulty with his catheter supply company, [employer].  He states he has been requesting more catheters from them and that they are requesting documentation of his  infections in order to provide this.  He does have a remote 30-pack-year smoking history.  He reports allergies to cephalosporins, penicillins, fluoroquinolones, doxycycline and erythromycin.  PMH: Past Medical History:  Diagnosis Date  . Anxiety   . Back pain   . Cancer ([place])    skin cancer, melanoma on nose  . Chronic back pain   . Constipation   . Coronary artery disease   . DDD (degenerative disc disease), lumbar    lumbar, wrist  . Depression   . Diabetes mellitus without complication ([place])   . Diabetes mellitus, type II ([place])   . Diverticulitis    diverticulosis  . Dizziness    occassional, if get up too fast  . GERD (gastroesophageal reflux disease)   . Headache    every other day  . Heart disease   . History of kidney stones   . HOH (hard of hearing)    left ear  . Hospitalization or health care facility admission within last 6 months    During summer; following bleeding after urinary catherterization. Early fall-assessment of chest pain, no significant findings  . Hypercholesterolemia   . Hypertension    controlled on meds  . Intermittent self-catheterization of bladder   . Kidney stones    h/o  . Neuromuscular disorder ([place])    nerve damage from back surgery, self catheterizes/ leg weakness  . Osteoporosis   . PUD (peptic ulcer disease)   . Shingles   . Ulcer of the stomach and intestine     Surgical History: Past Surgical History:  Procedure Laterality Date  . BACK SURGERY     nerve damage, has to self catheterize  .  CARDIAC CATHETERIZATION N/A 08/28/2015   Procedure: Left Heart Cath and Coronary Angiography;  Surgeon: Isaias Cowman, MD;  Location: [facility];  Service: Cardiovascular;  Laterality: N/A;  . CATARACT EXTRACTION W/PHACO Left 06/23/2018   Procedure: CATARACT EXTRACTION PHACO AND INTRAOCULAR LENS PLACEMENT ([place])  COMPLICATED LEFT DIABETIC;  Surgeon: Leandrew Koyanagi, MD;  Location: [facility];  Service:  Ophthalmology;  Laterality: Left;  diabetic-insulin dependent, latex allergy  . CHOLECYSTECTOMY    . COLONOSCOPY WITH PROPOFOL N/A 08/07/2017   Procedure: COLONOSCOPY WITH PROPOFOL;  Surgeon: Manya Silvas, MD;  Location: [facility];  Service: Endoscopy;  Laterality: N/A;  . CORONARY ANGIOPLASTY     2 stents  . CORONARY STENT PLACEMENT    . ESOPHAGOGASTRODUODENOSCOPY (EGD) WITH PROPOFOL N/A 09/29/2016   Procedure: ESOPHAGOGASTRODUODENOSCOPY (EGD) WITH PROPOFOL;  Surgeon: Manya Silvas, MD;  Location: [facility]COPY;  Service: Endoscopy;  Laterality: N/A;  . ESOPHAGOGASTRODUODENOSCOPY (EGD) WITH PROPOFOL N/A 08/07/2017   Procedure: ESOPHAGOGASTRODUODENOSCOPY (EGD) WITH PROPOFOL;  Surgeon: Manya Silvas, MD;  Location: [facility] ENDOSCOPY;  Service: Endoscopy;  Laterality: N/A;  . HAND SURGERY Right     Home Medications:  Allergies as of 04/08/2019      Reactions   Cephalexin Hives   Other reaction(s): Unknown Other reaction(s): Unknown   Cephalosporins Swelling   Other Reaction: reacts to Keflex   Penicillins Swelling   Childhood allergy Has patient had a PCN reaction causing immediate rash, facial/tongue/throat swelling, SOB or lightheadedness with hypotension: Unknown Has patient had a PCN reaction causing severe rash involving mucus membranes or skin necrosis: Unknown Has patient had a PCN reaction that required hospitalization: Unknown Has patient had a PCN reaction occurring within the last 10 years: No If all of the above answers are "NO", then may proceed with Cephalosporin use.   Ampicillin Swelling   Other reaction(s): Unknown   Cefazolin Swelling   Other reaction(s): Other (qualifier value)   Cefuroxime Axetil Swelling   Ciprofloxacin Swelling   Other reaction(s): Respiratory distress (finding), Weal   Clindamycin Swelling   Doxycycline Swelling   Other reaction(s): Unknown   Erythromycin Swelling   Other reaction(s): Unknown   Latex Other (See Comments)    Other reaction(s): BLISTERS   Levofloxacin Swelling   Other reaction(s): Altered mental status (finding), Itching of Skin   Other Swelling   All Mycins.Marland Kitchen   Pork (porcine) Protein    Other reaction(s): Other (qualifier value)   Prevacid [lansoprazole]    Other reaction(s): Unknown Other reaction(s): Unknown      Medication List       Accurate as of April 08, 2019  1:52 PM. If you have any questions, ask your nurse or doctor.        STOP taking these medications   albuterol 108 (90 Base) MCG/ACT inhaler Commonly known as: VENTOLIN HFA Stopped by: Debroah Loop, PA-C   ProAir HFA 108 (90 Base) MCG/ACT inhaler Generic drug: albuterol Stopped by: Debroah Loop, PA-C     TAKE these medications   aspirin 325 MG tablet Take 325 mg by mouth daily.   BD SafetyGlide Shielded Needle 25G X 1" Misc Generic drug: NEEDLE (DISP) 25 G Use 1 each monthly. for B12 injections   buPROPion 150 MG 24 hr tablet Commonly known as: Wellbutrin XL Take 1 tablet (150 mg total) by mouth daily. To be taken along with 300 mg   buPROPion 300 MG 24 hr tablet Commonly known as: Wellbutrin XL Take 1 tablet (300 mg total) by mouth  daily. To be combined with 150 mg   cyanocobalamin 1000 MCG/ML injection Commonly known as: (VITAMIN B-12) Inject 1,000 mcg into the muscle every 30 (thirty) days.   docusate sodium 50 MG capsule Commonly known as: COLACE Take 100 mg by mouth daily.   Exel Comfort Point Pen Needle 29G X 12MM Misc Generic drug: Insulin Pen Needle Use one four times daily.   gabapentin 300 MG capsule Commonly known as: NEURONTIN Take 300 mg by mouth at bedtime.   gemfibrozil 600 MG tablet Commonly known as: LOPID Take 600 mg by mouth 2 (two) times daily before a meal.   HumaLOG KwikPen 100 UNIT/ML KiwkPen Generic drug: insulin lispro Inject 15 Units into the skin 3 (three) times daily.   HumaLOG KwikPen 100 UNIT/ML KwikPen Generic drug: insulin lispro    hydrochlorothiazide 25 MG tablet Commonly known as: HYDRODIURIL Take by mouth.   hydrOXYzine 10 MG tablet Commonly known as: ATARAX/VISTARIL TAKE 1-2 TABLETS (10-20 MG TOTAL) BY MOUTH 2 (TWO) TIMES DAILY AS NEEDED FOR ANXIETY.   isosorbide mononitrate 30 MG 24 hr tablet Commonly known as: IMDUR Take 30 mg by mouth daily.   Lantus SoloStar 100 UNIT/ML Solostar Pen Generic drug: Insulin Glargine INJECT SUBCUTANEOUSLY 70  UNITS NIGHTLY   lisinopril 10 MG tablet Commonly known as: ZESTRIL Take by mouth.   MEDSAVER SYRINGE 3CC/23GX1" 23G X 1" 3 ML Misc Generic drug: SYRINGE-NEEDLE (DISP) 3 ML Use as directed with  cyanocobalamin   metFORMIN 1000 MG tablet Commonly known as: GLUCOPHAGE Take 1,000 mg by mouth 2 (two) times daily with a meal.   metoprolol succinate 25 MG 24 hr tablet Commonly known as: TOPROL-XL Take 25 mg by mouth daily.   nitroGLYCERIN 0.4 MG SL tablet Commonly known as: NITROSTAT Place 0.4 mg under the tongue every 5 (five) minutes as needed for chest pain.   omeprazole 40 MG capsule Commonly known as: PRILOSEC Take 40 mg by mouth daily.   ONE TOUCH ULTRA MINI w/Device Kit   ONE TOUCH ULTRA TEST test strip Generic drug: glucose blood Use as instructed 5 times daily.   orphenadrine 100 MG tablet Commonly known as: NORFLEX Take 1 tablet (100 mg total) by mouth at bedtime.   Oxycodone HCl 20 MG Tabs LIMIT 1/2 - 1 TABLET BY MOUTH 3 - 5 TIMES PER DAY. NOTE TABLET IS STRONGER   prasugrel 10 MG Tabs tablet Commonly known as: EFFIENT Take 10 mg by mouth daily.   Ranexa 500 MG 12 hr tablet Generic drug: ranolazine Two tablets twice a day   sertraline 100 MG tablet Commonly known as: ZOLOFT TAKE 1 TABLET BY MOUTH  DAILY   sucralfate 1 g tablet Commonly known as: CARAFATE TAKE 1 TABLET (1 G TOTAL) BY MOUTH 4 (FOUR) TIMES DAILY BEFORE MEALS AND NIGHTLY   sulfamethoxazole-trimethoprim 800-160 MG tablet Commonly known as: BACTRIM DS Take 1 tablet  by mouth 2 (two) times daily for 14 days. Started by: Debroah Loop, PA-C   traZODone 150 MG tablet Commonly known as: DESYREL TAKE 1 TABLET BY MOUTH AT  BEDTIME   traZODone 50 MG tablet Commonly known as: DESYREL Take 0.5-1 tablets (25-50 mg total) by mouth at bedtime as needed for sleep. To be added to 150 mg as needed   Trulicity 1.5 VZ/8.5YI Sopn Generic drug: Dulaglutide       Allergies:  Allergies  Allergen Reactions  . Cephalexin Hives    Other reaction(s): Unknown Other reaction(s): Unknown  . Cephalosporins Swelling    Other  Reaction: reacts to Keflex  . Penicillins Swelling    Childhood allergy Has patient had a PCN reaction causing immediate rash, facial/tongue/throat swelling, SOB or lightheadedness with hypotension: Unknown Has patient had a PCN reaction causing severe rash involving mucus membranes or skin necrosis: Unknown Has patient had a PCN reaction that required hospitalization: Unknown Has patient had a PCN reaction occurring within the last 10 years: No If all of the above answers are "NO", then may proceed with Cephalosporin use.   . Ampicillin Swelling    Other reaction(s): Unknown  . Cefazolin Swelling    Other reaction(s): Other (qualifier value)  . Cefuroxime Axetil Swelling  . Ciprofloxacin Swelling    Other reaction(s): Respiratory distress (finding), Weal  . Clindamycin Swelling  . Doxycycline Swelling    Other reaction(s): Unknown  . Erythromycin Swelling    Other reaction(s): Unknown  . Latex Other (See Comments)    Other reaction(s): BLISTERS  . Levofloxacin Swelling    Other reaction(s): Altered mental status (finding), Itching of Skin  . Other Swelling    All Mycins..  . Pork (Porcine) Protein     Other reaction(s): Other (qualifier value)  . Prevacid [Lansoprazole]     Other reaction(s): Unknown Other reaction(s): Unknown    Family History: Family History  Problem Relation Age of Onset  . Diabetes Mother   .  Heart disease Mother   . Hypertension Mother   . Depression Father   . Heart disease Father   . Hypertension Father   . Prostate cancer Neg Hx   . Bladder Cancer Neg Hx   . Kidney cancer Neg Hx     Social History:   reports that he quit smoking about 25 years ago. His smoking use included cigarettes. He has never used smokeless tobacco. He reports that he does not drink alcohol or use drugs.  ROS: UROLOGY Frequent Urination?: Yes Hard to postpone urination?: Yes Burning/pain with urination?: Yes Get up at night to urinate?: Yes Leakage of urine?: Yes Urine stream starts and stops?: No Trouble starting stream?: No Do you have to strain to urinate?: No Blood in urine?: Yes Urinary tract infection?: No Sexually transmitted disease?: No Injury to kidneys or bladder?: No Painful intercourse?: No Weak stream?: No Erection problems?: No Penile pain?: No  Gastrointestinal Nausea?: No Vomiting?: No Indigestion/heartburn?: No Diarrhea?: No Constipation?: No  Constitutional Fever: No Night sweats?: No Weight loss?: No Fatigue?: Yes  Skin Skin rash/lesions?: No Itching?: No  Eyes Blurred vision?: No Double vision?: No  Ears/Nose/Throat Sore throat?: No Sinus problems?: Yes  Hematologic/Lymphatic Swollen glands?: No Easy bruising?: No  Cardiovascular Leg swelling?: No Chest pain?: No  Respiratory Cough?: No Shortness of breath?: No  Endocrine Excessive thirst?: No  Musculoskeletal Back pain?: Yes Joint pain?: No  Neurological Headaches?: No Dizziness?: No  Psychologic Depression?: Yes Anxiety?: Yes  Physical Exam: BP 138/75 (BP Location: Left Arm, Patient Position: Sitting, Cuff Size: Normal)   Pulse 74   Ht '5\' 9"'$  (1.753 m)   Wt 199 lb 9.6 oz (90.5 kg)   BMI 29.48 kg/m   Constitutional:  Alert and oriented, no acute distress, nontoxic appearing HEENT: [place], AT Cardiovascular: No clubbing, cyanosis, or edema Respiratory: Normal respiratory  effort, no increased work of breathing Skin: No rashes, bruises or suspicious lesions Neurologic: Grossly intact, no focal deficits, moving all 4 extremities Psychiatric: Normal mood and affect  Laboratory Data: Results for orders placed or performed in visit on 04/08/19  Microscopic Examination   URINE  Result Value Ref Range   WBC, UA >30 (A) 0 - 5 /hpf   RBC 11-30 (A) 0 - 2 /hpf   Epithelial Cells (non renal) 0-10 0 - 10 /hpf   Renal Epithel, UA 0-10 (A) None seen /hpf   Crystals Present (A) N/A   Crystal Type Amorphous Sediment N/A   Bacteria, UA Moderate (A) None seen/Few  Urinalysis, Complete  Result Value Ref Range   Specific Gravity, UA >1.030 (H) 1.005 - 1.030   pH, UA 5.0 5.0 - 7.5   Color, UA Brown (A) Yellow   Appearance Ur Cloudy (A) Clear   Leukocytes,UA 3+ (A) Negative   Protein,UA 2+ (A) Negative/Trace   Glucose, UA Negative Negative   Ketones, UA Trace (A) Negative   RBC, UA 2+ (A) Negative   Bilirubin, UA Negative Negative   Urobilinogen, Ur 0.2 0.2 - 1.0 mg/dL   Nitrite, UA Positive (A) Negative   Microscopic Examination See below:    Assessment & Plan:   1. Dysuria Patient with symptoms and UA consistent with urinary tract infection today.  Will send for culture. - Urinalysis, Complete - CULTURE, URINE COMPREHENSIVE  2. Acute cystitis with hematuria We will treat based on symptoms and history.  Given patient's reported minimal symptom alleviation following 2 rounds of antibiotic therapy, I suspect he is struggling with persistent urinary tract infection rather than 3 distinct infections.  I am prescribing a more prolonged course today, which is appropriate given his history of CIC and neurogenic bladder.  Patient will need to return to clinic to provide a follow-up urinalysis in 2 weeks to prove resolution of his microscopic hematuria.  Additionally, given his smoking history and irritative voiding symptoms, I will order a cystoscopy for him if his  symptoms persist despite treatment or if there is no resolution of his microscopic hematuria. - sulfamethoxazole-trimethoprim (BACTRIM DS) 800-160 MG tablet; Take 1 tablet by mouth 2 (two) times daily for 14 days.  Dispense: 28 tablet; Refill: 0  Return in about 2 weeks (around 04/22/2019) for Lab visit for UA.  3. Neurogenic bladder Patient with known neurogenic bladder obtaining catheter supplies through [place].  He states he has been having difficulty recently getting enough catheters given his increase in CIC frequency with his recent infections.  I advised him to work through our office to provide the [employer] is requesting to justify his increased need for supplies.  Additionally, I urged him to reestablish annual visit frequency with [place] for ongoing management of this condition.  He expressed understanding.  Follow-up plan to be determined based on the results of his follow-up urinalysis and treatment of his current infections.  Debroah Loop, PA-C  [facility] [address] [place], [place] [address] [phone]

## 2019-04-11 LAB — CULTURE, URINE COMPREHENSIVE

## 2019-04-19 ENCOUNTER — Other Ambulatory Visit: Payer: Self-pay | Admitting: *Deleted

## 2019-04-19 DIAGNOSIS — R300 Dysuria: Secondary | ICD-10-CM

## 2019-04-21 ENCOUNTER — Other Ambulatory Visit: Payer: Self-pay

## 2019-04-21 ENCOUNTER — Encounter: Payer: Self-pay | Admitting: Psychiatry

## 2019-04-21 ENCOUNTER — Other Ambulatory Visit: Payer: Medicare Other

## 2019-04-21 ENCOUNTER — Other Ambulatory Visit: Payer: Self-pay | Admitting: Physician Assistant

## 2019-04-21 ENCOUNTER — Ambulatory Visit (INDEPENDENT_AMBULATORY_CARE_PROVIDER_SITE_OTHER): Payer: 59 | Admitting: Psychiatry

## 2019-04-21 DIAGNOSIS — R3129 Other microscopic hematuria: Secondary | ICD-10-CM

## 2019-04-21 DIAGNOSIS — F411 Generalized anxiety disorder: Secondary | ICD-10-CM | POA: Insufficient documentation

## 2019-04-21 DIAGNOSIS — F5105 Insomnia due to other mental disorder: Secondary | ICD-10-CM

## 2019-04-21 DIAGNOSIS — R300 Dysuria: Secondary | ICD-10-CM

## 2019-04-21 DIAGNOSIS — F3341 Major depressive disorder, recurrent, in partial remission: Secondary | ICD-10-CM

## 2019-04-21 LAB — URINALYSIS, COMPLETE
Bilirubin, UA: NEGATIVE
Glucose, UA: NEGATIVE
Ketones, UA: NEGATIVE
Leukocytes,UA: NEGATIVE
Nitrite, UA: NEGATIVE
Protein,UA: NEGATIVE
Specific Gravity, UA: >1.03 — ABNORMAL HIGH (ref 1.005–1.030)
Urobilinogen, Ur: 0.2 mg/dL (ref 0.2–1.0)
pH, UA: 5.5 (ref 5.0–7.5)

## 2019-04-21 LAB — MICROSCOPIC EXAMINATION: Bacteria, UA: NONE SEEN

## 2019-04-21 MED ORDER — TRAZODONE HCL 50 MG PO TABS: 25.0000 mg | ORAL_TABLET | Freq: Every evening | ORAL | 1 refills | Status: AC | PRN

## 2019-04-21 MED ORDER — TRAZODONE HCL 150 MG PO TABS: 150.0000 mg | ORAL_TABLET | Freq: Every day | ORAL | 1 refills | Status: AC

## 2019-04-21 MED ORDER — BUPROPION HCL ER (XL) 150 MG PO TB24: 150.0000 mg | ORAL_TABLET | Freq: Every day | ORAL | 1 refills | Status: AC

## 2019-04-21 MED ORDER — BUPROPION HCL ER (XL) 300 MG PO TB24: 300.0000 mg | ORAL_TABLET | Freq: Every day | ORAL | 1 refills | Status: AC

## 2019-04-21 NOTE — Progress Notes (Signed)
Virtual Visit via Telephone Note  I connected with Todd Banks on 04/21/19 at 10:15 AM EDT by telephone and verified that I am speaking with the correct person using two identifiers.   I discussed the limitations, risks, security and privacy concerns of performing an evaluation and management service by telephone and the availability of in person appointments. I also discussed with the patient that there may be a patient responsible charge related to this service. The patient expressed understanding and agreed to proceed.     I discussed the assessment and treatment plan with the patient. The patient was provided an opportunity to ask questions and all were answered. The patient agreed with the plan and demonstrated an understanding of the instructions.   The patient was advised to call back or seek an in-person evaluation if the symptoms worsen or if the condition fails to improve as anticipated.  Ty Ty MD P OP Progress Note  04/21/2019 3:49 PM Todd Banks  MRN:  [id number]  Chief Complaint:  Chief Complaint    Follow-up     HPI: Todd Banks is 68 year old Caucasian male, married, has a history of MDD, GAD, insomnia, chronic pain was evaluated by phone today.  Patient preferred to do a phone call.  Patient today reports he is currently going through a UTI.  He is currently taking antibiotics for the same.  He reports he continues to have some residual symptoms of increased frequency of urination and dysuria.  He reports he has upcoming appointment for follow-up with his provider again.  He otherwise reports he is doing okay with regards to his mood symptoms.  He denies any depression or anxiety symptoms at this time.  He reports sleep is okay.  He continues to be compliant with his Wellbutrin and his Zoloft and his trazodone.  He denies any side effects to the medications.  Patient denies any suicidality homicidality or perceptual disturbances.  He appeared to be alert, oriented  to person place and situation.  He denies any other concerns today. Visit Diagnosis:    ICD-10-CM   1. MDD (major depressive disorder), recurrent, in partial remission (HCC)  F33.41 buPROPion (WELLBUTRIN XL) 150 MG 24 hr tablet    buPROPion (WELLBUTRIN XL) 300 MG 24 hr tablet    traZODone (DESYREL) 50 MG tablet    traZODone (DESYREL) 150 MG tablet  2. GAD (generalized anxiety disorder)  F41.1 traZODone (DESYREL) 50 MG tablet  3. Insomnia due to mental condition  F51.05     Past Psychiatric History: I have reviewed past psychiatric history from my progress note on 10/27/2017  Past Medical History:  Past Medical History:  Diagnosis Date  . Anxiety   . Back pain   . Cancer ([place])    skin cancer, melanoma on nose  . Chronic back pain   . Constipation   . Coronary artery disease   . DDD (degenerative disc disease), lumbar    lumbar, wrist  . Depression   . Diabetes mellitus without complication ([place])   . Diabetes mellitus, type II ([place])   . Diverticulitis    diverticulosis  . Dizziness    occassional, if get up too fast  . GERD (gastroesophageal reflux disease)   . Headache    every other day  . Heart disease   . History of kidney stones   . HOH (hard of hearing)    left ear  . Hospitalization or health care facility admission within last 6 months    During summer; following  bleeding after urinary catherterization. Early fall-assessment of chest pain, no significant findings  . Hypercholesterolemia   . Hypertension    controlled on meds  . Intermittent self-catheterization of bladder   . Kidney stones    h/o  . Neuromuscular disorder ([place])    nerve damage from back surgery, self catheterizes/ leg weakness  . Osteoporosis   . PUD (peptic ulcer disease)   . Shingles   . Ulcer of the stomach and intestine     Past Surgical History:  Procedure Laterality Date  . BACK SURGERY     nerve damage, has to self catheterize  . CARDIAC CATHETERIZATION N/A 08/28/2015    Procedure: Left Heart Cath and Coronary Angiography;  Surgeon: Isaias Cowman, MD;  Location: [facility];  Service: Cardiovascular;  Laterality: N/A;  . CATARACT EXTRACTION W/PHACO Left 06/23/2018   Procedure: CATARACT EXTRACTION PHACO AND INTRAOCULAR LENS PLACEMENT ([place])  COMPLICATED LEFT DIABETIC;  Surgeon: Leandrew Koyanagi, MD;  Location: [facility];  Service: Ophthalmology;  Laterality: Left;  diabetic-insulin dependent, latex allergy  . CHOLECYSTECTOMY    . COLONOSCOPY WITH PROPOFOL N/A 08/07/2017   Procedure: COLONOSCOPY WITH PROPOFOL;  Surgeon: Manya Silvas, MD;  Location: [facility] END[facility]COPY;  Service: Endoscopy;  Laterality: N/A;  . CORONARY ANGIOPLASTY     2 stents  . CORONARY STENT PLACEMENT    . ESOPHAGOGASTRODUODENOSCOPY (EGD) WITH PROPOFOL N/A 09/29/2016   Procedure: ESOPHAGOGASTRODUODENOSCOPY (EGD) WITH PROPOFOL;  Surgeon: Manya Silvas, MD;  Location[facility];  Service: Endoscopy;  Laterality: N/A;  . ESOPHAGOGASTRODUODENOSCOPY (EGD) WITH PROPOFOL N/A 08/07/2017   Procedure: ESOPHAGOGASTRODUODENOSCOPY (EGD) WITH PROPOFOL;  Surgeon: Manya Silvas, MD;  Location: [facility] ENDOSCOPY;  Service: Endoscopy;  Laterality: N/A;  . HAND SURGERY Right     Family Psychiatric History: I have reviewed family psychiatric history from my progress note on 10/27/2017  Family History:  Family History  Problem Relation Age of Onset  . Diabetes Mother   . Heart disease Mother   . Hypertension Mother   . Depression Father   . Heart disease Father   . Hypertension Father   . Prostate cancer Neg Hx   . Bladder Cancer Neg Hx   . Kidney cancer Neg Hx     Social History: I have reviewed social history from my progress note on 10/27/2017 Social History   Socioeconomic History  . Marital status: Married    Spouse name: lynette  . Number of children: 4  . Years of education: Not on file  . Highest education level: Some college, no degree  Occupational  History    Comment: disabled  Social Needs  . Financial resource strain: Not hard at all  . Food insecurity    Worry: Never true    Inability: Never true  . Transportation needs    Medical: No    Non-medical: No  Tobacco Use  . Smoking status: Former Smoker    Types: Cigarettes    Quit date: 07/22/1993    Years since quitting: 25.7  . Smokeless tobacco: Never Used  Substance and Sexual Activity  . Alcohol use: No    Alcohol/week: 0.0 standard drinks  . Drug use: No  . Sexual activity: Not Currently  Lifestyle  . Physical activity    Days per week: 0 days    Minutes per session: 0 min  . Stress: Not on file  Relationships  . Social Herbalist on phone: Never    Gets together: More than three  times a week    Attends religious service: More than 4 times per year    Active member of club or organization: No    Attends meetings of clubs or organizations: Never    Relationship status: Married  Other Topics Concern  . Not on file  Social History Narrative  . Not on file    Allergies:  Allergies  Allergen Reactions  . Cephalexin Hives    Other reaction(s): Unknown Other reaction(s): Unknown  . Cephalosporins Swelling    Other Reaction: reacts to Keflex  . Penicillins Swelling    Childhood allergy Has patient had a PCN reaction causing immediate rash, facial/tongue/throat swelling, SOB or lightheadedness with hypotension: Unknown Has patient had a PCN reaction causing severe rash involving mucus membranes or skin necrosis: Unknown Has patient had a PCN reaction that required hospitalization: Unknown Has patient had a PCN reaction occurring within the last 10 years: No If all of the above answers are "NO", then may proceed with Cephalosporin use.   . Ampicillin Swelling    Other reaction(s): Unknown  . Cefazolin Swelling    Other reaction(s): Other (qualifier value)  . Cefuroxime Axetil Swelling  . Ciprofloxacin Swelling    Other reaction(s): Respiratory  distress (finding), Weal  . Clindamycin Swelling  . Doxycycline Swelling    Other reaction(s): Unknown  . Erythromycin Swelling    Other reaction(s): Unknown  . Latex Other (See Comments)    Other reaction(s): BLISTERS  . Levofloxacin Swelling    Other reaction(s): Altered mental status (finding), Itching of Skin  . Other Swelling    All Mycins..  . Pork (Porcine) Protein     Other reaction(s): Other (qualifier value)  . Prevacid [Lansoprazole]     Other reaction(s): Unknown Other reaction(s): Unknown    Metabolic Disorder Labs: No results found for: HGBA1C, MPG Lab Results  Component Value Date   PROLACTIN 19.0 (H) 03/26/2017   No results found for: CHOL, TRIG, HDL, CHOLHDL, VLDL, LDLCALC Lab Results  Component Value Date   TSH 1.387 01/12/2018    Therapeutic Level Labs: No results found for: LITHIUM No results found for: VALPROATE No components found for:  CBMZ  Current Medications: Current Outpatient Medications  Medication Sig Dispense Refill  . aspirin 325 MG tablet Take 325 mg by mouth daily.    . Blood Glucose Monitoring Suppl (ONE TOUCH ULTRA MINI) w/Device KIT     . buPROPion (WELLBUTRIN XL) 150 MG 24 hr tablet Take 1 tablet (150 mg total) by mouth daily. To be taken along with 300 mg 90 tablet 1  . buPROPion (WELLBUTRIN XL) 300 MG 24 hr tablet Take 1 tablet (300 mg total) by mouth daily. To be combined with 150 mg 90 tablet 1  . cyanocobalamin (,VITAMIN B-12,) 1000 MCG/ML injection Inject 1,000 mcg into the muscle every 30 (thirty) days.     Marland Kitchen docusate sodium (COLACE) 50 MG capsule Take 100 mg by mouth daily.     Marland Kitchen gabapentin (NEURONTIN) 300 MG capsule Take 300 mg by mouth at bedtime.     Marland Kitchen gemfibrozil (LOPID) 600 MG tablet Take 600 mg by mouth 2 (two) times daily before a meal.    . glucose blood (ONE TOUCH ULTRA TEST) test strip Use as instructed 5 times daily.    Marland Kitchen HUMALOG KWIKPEN 100 UNIT/ML KwikPen     . hydrOXYzine (ATARAX/VISTARIL) 10 MG tablet TAKE  1-2 TABLETS (10-20 MG TOTAL) BY MOUTH 2 (TWO) TIMES DAILY AS NEEDED FOR ANXIETY. 120 tablet 1  .  Insulin Glargine (LANTUS SOLOSTAR) 100 UNIT/ML Solostar Pen INJECT SUBCUTANEOUSLY 70  UNITS NIGHTLY    . insulin lispro (HUMALOG KWIKPEN) 100 UNIT/ML KiwkPen Inject 15 Units into the skin 3 (three) times daily.     . Insulin Pen Needle (EXEL COMFORT POINT PEN NEEDLE) 29G X 12MM MISC Use one four times daily.    . isosorbide mononitrate (IMDUR) 30 MG 24 hr tablet Take 30 mg by mouth daily.     . metFORMIN (GLUCOPHAGE) 1000 MG tablet Take 1,000 mg by mouth 2 (two) times daily with a meal.    . metoprolol succinate (TOPROL-XL) 25 MG 24 hr tablet Take 25 mg by mouth daily.    Marland Kitchen NEEDLE, DISP, 25 G (BD SAFETYGLIDE SHIELDED NEEDLE) 25G X 1" MISC Use 1 each monthly. for B12 injections    . nitroGLYCERIN (NITROSTAT) 0.4 MG SL tablet Place 0.4 mg under the tongue every 5 (five) minutes as needed for chest pain.     Marland Kitchen omeprazole (PRILOSEC) 40 MG capsule Take 40 mg by mouth daily.     . orphenadrine (NORFLEX) 100 MG tablet Take 1 tablet (100 mg total) by mouth at bedtime.    . Oxycodone HCl 20 MG TABS LIMIT 1/2 - 1 TABLET BY MOUTH 3 - 5 TIMES PER DAY. NOTE TABLET IS STRONGER  0  . prasugrel (EFFIENT) 10 MG TABS tablet Take 10 mg by mouth daily.    Marland Kitchen RANEXA 500 MG 12 hr tablet Two tablets twice a day 120 tablet 0  . sertraline (ZOLOFT) 100 MG tablet TAKE 1 TABLET BY MOUTH  DAILY 90 tablet 3  . sucralfate (CARAFATE) 1 g tablet TAKE 1 TABLET (1 G TOTAL) BY MOUTH 4 (FOUR) TIMES DAILY BEFORE MEALS AND NIGHTLY  11  . sulfamethoxazole-trimethoprim (BACTRIM DS) 800-160 MG tablet Take 1 tablet by mouth 2 (two) times daily for 14 days. 28 tablet 0  . SYRINGE-NEEDLE, DISP, 3 ML (MEDSAVER SYRINGE 3CC/23GX1") 23G X 1" 3 ML MISC Use as directed with  cyanocobalamin    . traZODone (DESYREL) 150 MG tablet Take 1 tablet (150 mg total) by mouth at bedtime. 90 tablet 1  . traZODone (DESYREL) 50 MG tablet Take 0.5-1 tablets (25-50 mg  total) by mouth at bedtime as needed for sleep. To be added to 150 mg as needed 90 tablet 1  . TRULICITY 1.5 ZH/2.9JM SOPN      No current facility-administered medications for this visit.      Musculoskeletal: Strength & Muscle Tone: UTA Gait & Station: reports as WNL Patient leans: N/A  Psychiatric Specialty Exam: Review of Systems  Genitourinary: Positive for dysuria and frequency.  Psychiatric/Behavioral: Negative for depression, hallucinations, substance abuse and suicidal ideas. The patient is not nervous/anxious.   All other systems reviewed and are negative.   There were no vitals taken for this visit.There is no height or weight on file to calculate BMI.  General Appearance: UTA  Eye Contact:  UTA  Speech:  Clear and Coherent  Volume:  Normal  Mood:  Euthymic  Affect:  UTA  Thought Process:  Goal Directed and Descriptions of Associations: Intact  Orientation:  Full (Time, Place, and Person)  Thought Content: Logical   Suicidal Thoughts:  No  Homicidal Thoughts:  No  Memory:  Immediate;   Fair Recent;   Fair Remote;   Fair  Judgement:  Fair  Insight:  Fair  Psychomotor Activity:  UTA  Concentration:  Concentration: Fair and Attention Span: Fair  Recall:  Fair  Fund of Knowledge: Fair  Language: Fair  Akathisia:  No  Handed:  Right  AIMS (if indicated): denies tremors, rigidity  Assets:  Communication Skills Desire for Improvement Social Support  ADL's:  Intact  Cognition: WNL  Sleep:  Fair   Screenings: PHQ2-9     Office Visit from 03/04/2016 in [place] Procedure visit from 01/21/2016 in [place] Clinical Support from 12/31/2015 in [place] Office Visit from 11/29/2015 in [place] Clinical Support from 10/04/2015 in [place]   PHQ-2 Total Score  0  0  0  0  0       Assessment and Plan: Todd Banks is a 68 year old Caucasian male, has a history of depression, anxiety, chronic pain was evaluated by phone today.  Patient with several psychosocial stressors like his own health issues, the COVID-19 pandemic is currently making progress.  He is currently stable on medications.  Plan For depression-in remission Wellbutrin extended release 450 mg p.o. daily Zoloft 100 mg p.o. daily  GAD-improving Zoloft 100 mg p.o. daily  For insomnia-stable Trazodone 150 to 200 mg p.o. nightly as needed  Patient to continue to follow-up with his therapist as needed.  Follow-up in clinic in 3 months or sooner if needed.  January 8 at 10 AM  I have spent atleast 15 MINUTES non face to face with patient today. More than 50 % of the time was spent for psychoeducation and supportive psychotherapy and care coordination.  This note was generated in part or whole with voice recognition software. Voice recognition is usually quite accurate but there are transcription errors that can and very often do occur. I apologize for any typographical errors that were not detected and corrected.        Ursula Alert, MD 04/21/2019, 3:49 PM

## 2019-04-21 NOTE — Progress Notes (Signed)
Repeat UA today reassuring for persistent cystitis.  However, patient does continue to have microscopic hematuria.  He CIC's and is on blood thinners including aspirin and Effient. He also has a remote 30 pack-year smoking history.  I spoke with the patient via telephone to report that I would like to start hematuria work-up at this time.  Etiologies of microscopic hematuria discussed.  CT urogram order placed today, I counseled him that I would like him to have this done before following up in clinic for cystoscopy and CT results with one of our MDs.  He expressed understanding.

## 2019-05-18 ENCOUNTER — Ambulatory Visit: Admission: RE | Admit: 2019-05-18 | Payer: Managed Care, Other (non HMO) | Source: Ambulatory Visit

## 2019-05-31 ENCOUNTER — Other Ambulatory Visit: Payer: Medicare Other | Admitting: Urology

## 2019-06-14 DEATH — deceased

## 2019-07-22 ENCOUNTER — Ambulatory Visit: Payer: Medicare Other | Admitting: Psychiatry

## 2019-08-10 IMAGING — CR DG CHEST 2V
2 series · 2 of 2 positions shown · non-contrast
Comparison: 07/02/2011

CLINICAL DATA: Left-sided chest pain

EXAM:
CHEST - 2 VIEW

[chest pa]
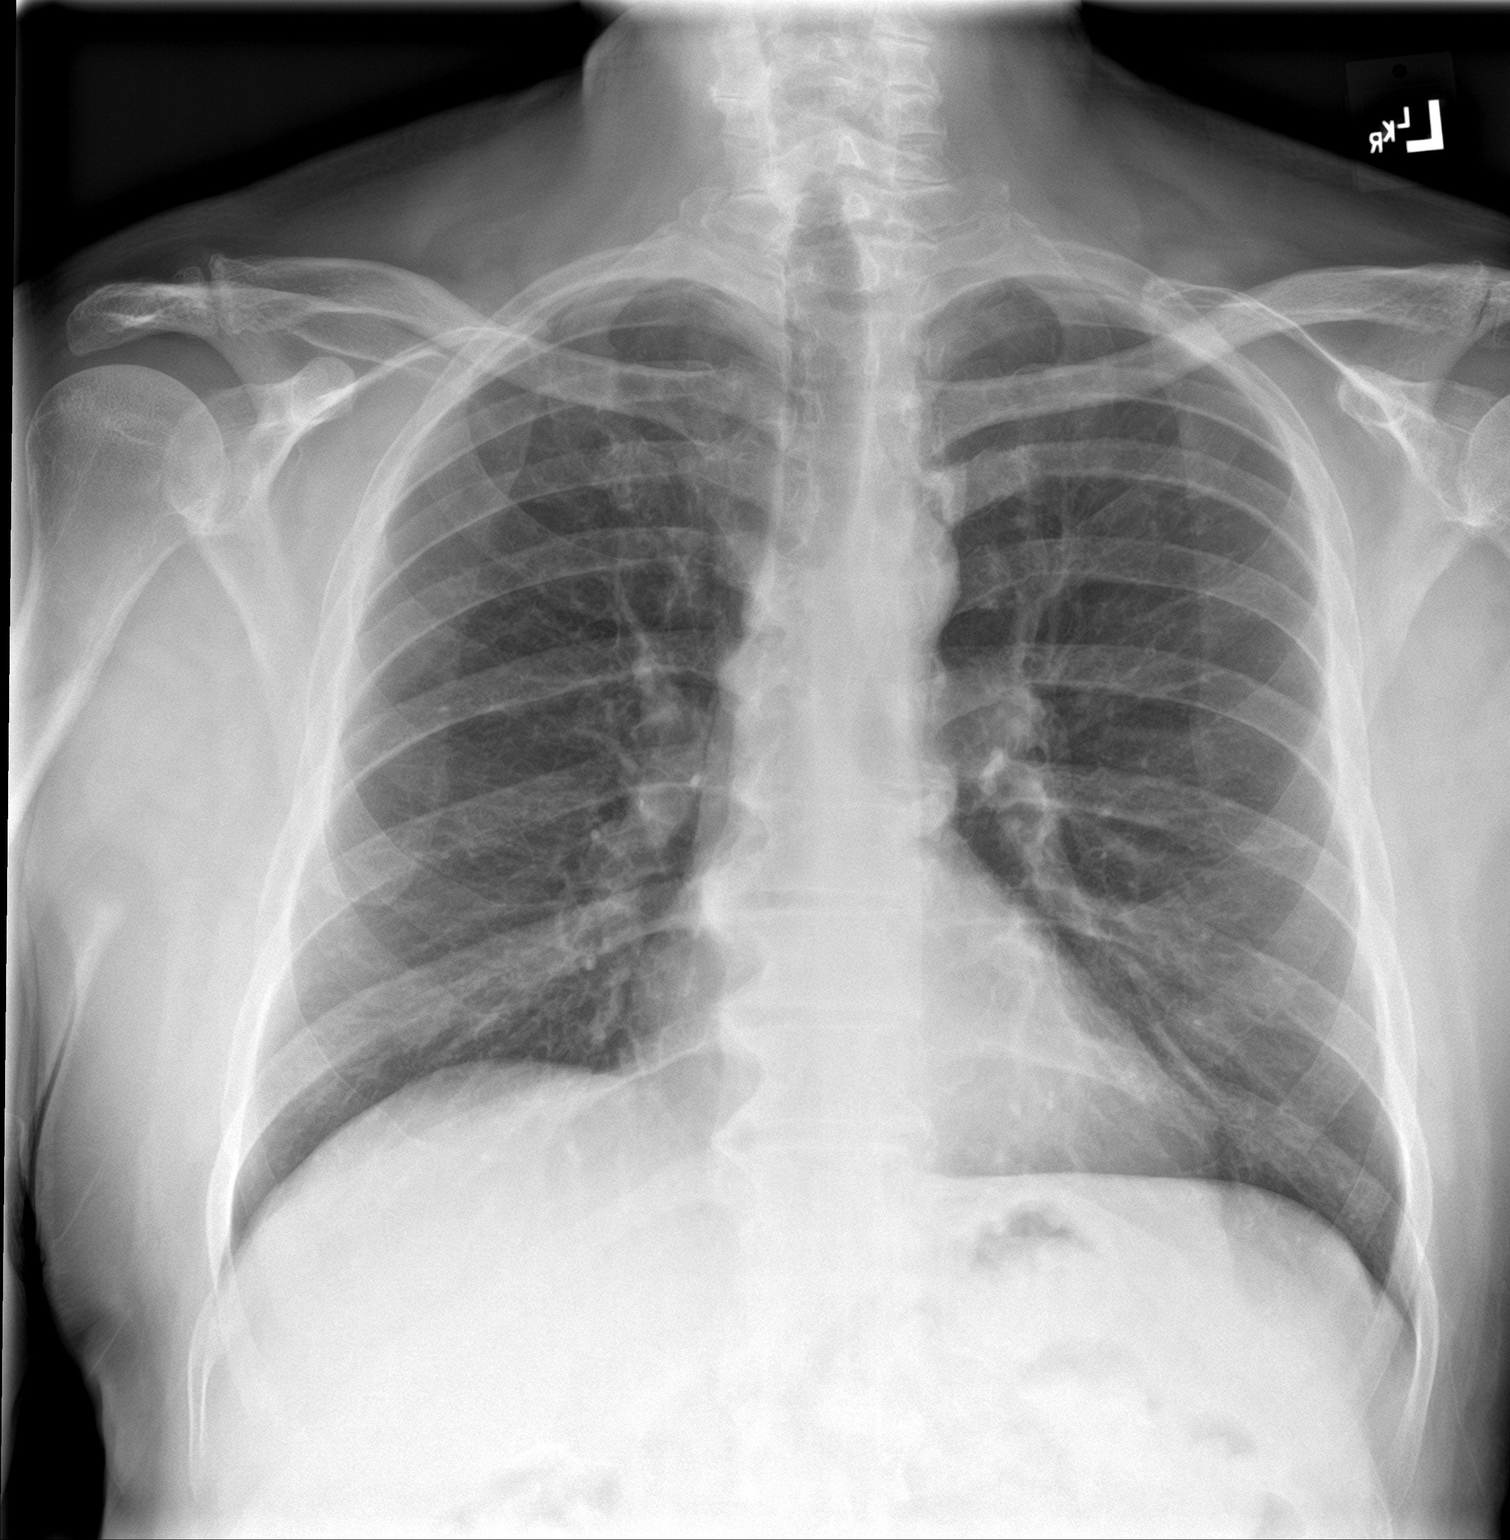

[chest lat]
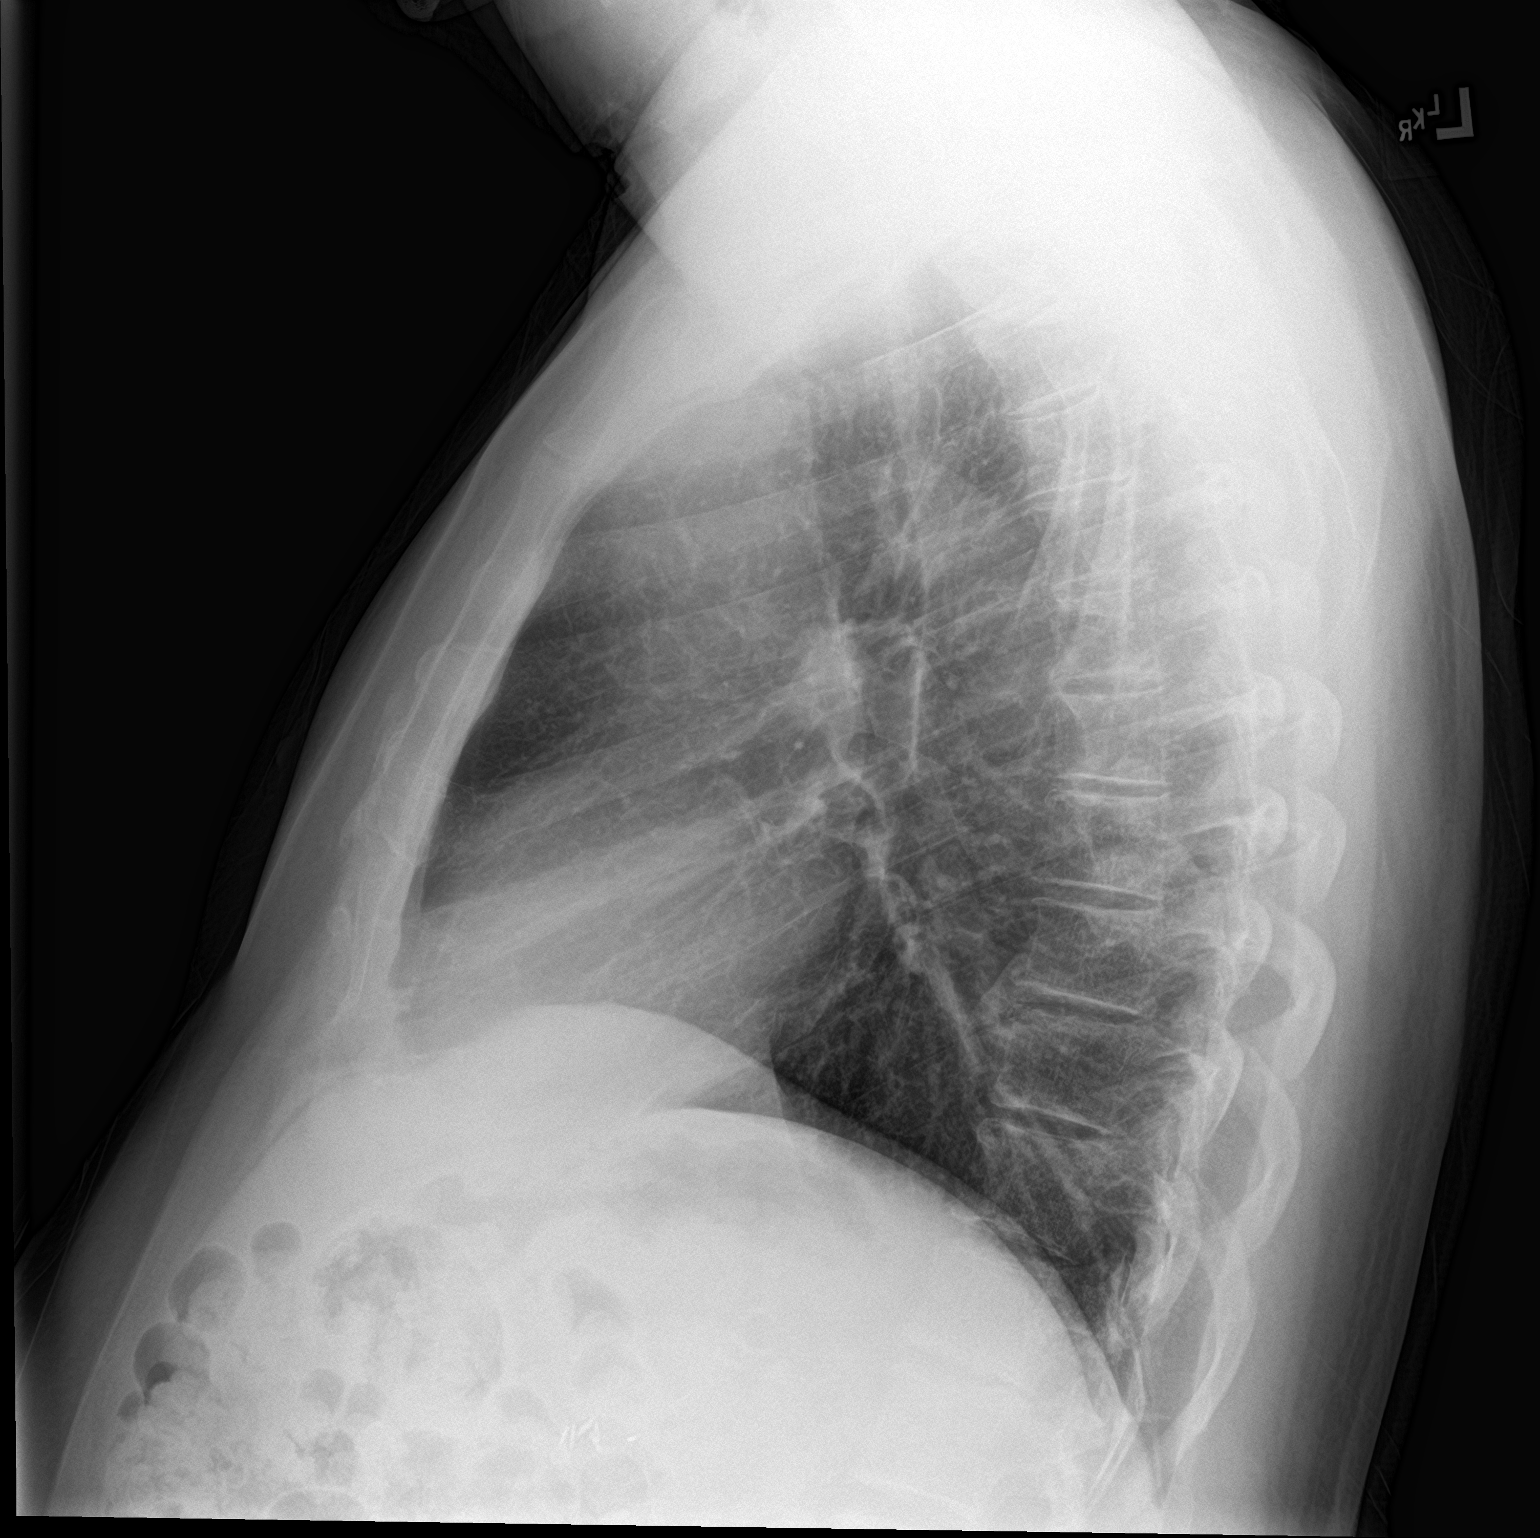

[2 of 2 positions shown; findings below may reference images not displayed]

FINDINGS: The heart size and mediastinal contours are within normal limits.
Both lungs are clear. The visualized skeletal structures are
unremarkable.
IMPRESSION: No active cardiopulmonary disease.
